# Patient Record
Sex: Female | Born: 1935 | Race: White | Hispanic: No | Smoking: Never smoker
Health system: Southern US, Community
[De-identification: ages and names within clinical notes are randomized; demographics above are authoritative.]

## PROBLEM LIST (undated history)

## (undated) DIAGNOSIS — M47816 Spondylosis without myelopathy or radiculopathy, lumbar region: Secondary | ICD-10-CM

## (undated) DIAGNOSIS — I739 Peripheral vascular disease, unspecified: Secondary | ICD-10-CM

## (undated) DIAGNOSIS — K5730 Diverticulosis of large intestine without perforation or abscess without bleeding: Secondary | ICD-10-CM

## (undated) DIAGNOSIS — F419 Anxiety disorder, unspecified: Secondary | ICD-10-CM

## (undated) DIAGNOSIS — I341 Nonrheumatic mitral (valve) prolapse: Secondary | ICD-10-CM

## (undated) DIAGNOSIS — N3090 Cystitis, unspecified without hematuria: Secondary | ICD-10-CM

## (undated) DIAGNOSIS — I639 Cerebral infarction, unspecified: Secondary | ICD-10-CM

## (undated) DIAGNOSIS — J3089 Other allergic rhinitis: Secondary | ICD-10-CM

## (undated) DIAGNOSIS — E7800 Pure hypercholesterolemia, unspecified: Secondary | ICD-10-CM

## (undated) DIAGNOSIS — S32010A Wedge compression fracture of first lumbar vertebra, initial encounter for closed fracture: Secondary | ICD-10-CM

## (undated) DIAGNOSIS — G319 Degenerative disease of nervous system, unspecified: Secondary | ICD-10-CM

## (undated) DIAGNOSIS — K226 Gastro-esophageal laceration-hemorrhage syndrome: Secondary | ICD-10-CM

## (undated) DIAGNOSIS — E049 Nontoxic goiter, unspecified: Secondary | ICD-10-CM

## (undated) DIAGNOSIS — R413 Other amnesia: Secondary | ICD-10-CM

## (undated) DIAGNOSIS — M1990 Unspecified osteoarthritis, unspecified site: Secondary | ICD-10-CM

## (undated) DIAGNOSIS — D649 Anemia, unspecified: Secondary | ICD-10-CM

## (undated) DIAGNOSIS — K642 Third degree hemorrhoids: Secondary | ICD-10-CM

## (undated) DIAGNOSIS — I10 Essential (primary) hypertension: Secondary | ICD-10-CM

## (undated) DIAGNOSIS — H4900 Third [oculomotor] nerve palsy, unspecified eye: Secondary | ICD-10-CM

## (undated) HISTORY — DX: Nontoxic goiter, unspecified: E04.9

## (undated) HISTORY — DX: Spondylosis without myelopathy or radiculopathy, lumbar region: M47.816

## (undated) HISTORY — DX: Unspecified osteoarthritis, unspecified site: M19.90

## (undated) HISTORY — DX: Cerebral infarction, unspecified: I63.9

## (undated) HISTORY — DX: Nonrheumatic mitral (valve) prolapse: I34.1

## (undated) HISTORY — DX: Third (oculomotor) nerve palsy, unspecified eye: H49.00

## (undated) HISTORY — DX: Cystitis, unspecified without hematuria: N30.90

## (undated) HISTORY — DX: Anemia, unspecified: D64.9

## (undated) HISTORY — DX: Third degree hemorrhoids: K64.2

## (undated) HISTORY — DX: Anxiety disorder, unspecified: F41.9

## (undated) HISTORY — DX: Degenerative disease of nervous system, unspecified: G31.9

## (undated) HISTORY — DX: Pure hypercholesterolemia, unspecified: E78.00

## (undated) HISTORY — DX: Other allergic rhinitis: J30.89

## (undated) HISTORY — DX: Diverticulosis of large intestine without perforation or abscess without bleeding: K57.30

## (undated) HISTORY — DX: Peripheral vascular disease, unspecified: I73.9

## (undated) HISTORY — PX: OTHER SURGICAL HISTORY: SHX169

## (undated) HISTORY — DX: Essential (primary) hypertension: I10

## (undated) HISTORY — DX: Wedge compression fracture of first lumbar vertebra, initial encounter for closed fracture: S32.010A

---

## 1958-11-27 HISTORY — PX: BREAST LUMPECTOMY: SHX2

## 1978-03-28 HISTORY — PX: APPENDECTOMY: SHX54

## 1978-03-28 HISTORY — PX: CHOLECYSTECTOMY: SHX55

## 1980-07-26 HISTORY — PX: ABDOMINAL HYSTERECTOMY: SHX81

## 1996-03-28 HISTORY — PX: COLONOSCOPY: SHX174

## 1997-12-26 HISTORY — PX: OTHER SURGICAL HISTORY: SHX169

## 1998-03-04 ENCOUNTER — Other Ambulatory Visit: Admission: RE | Admit: 1998-03-04 | Discharge: 1998-03-04 | Payer: Self-pay | Admitting: Obstetrics & Gynecology

## 1999-03-09 ENCOUNTER — Other Ambulatory Visit: Admission: RE | Admit: 1999-03-09 | Discharge: 1999-03-09 | Payer: Self-pay | Admitting: Obstetrics & Gynecology

## 2000-02-06 ENCOUNTER — Emergency Department (HOSPITAL_COMMUNITY): Admission: EM | Admit: 2000-02-06 | Discharge: 2000-02-06 | Payer: Self-pay | Admitting: Emergency Medicine

## 2000-02-16 ENCOUNTER — Emergency Department (HOSPITAL_COMMUNITY): Admission: EM | Admit: 2000-02-16 | Discharge: 2000-02-16 | Payer: Self-pay

## 2001-04-06 ENCOUNTER — Encounter: Payer: Self-pay | Admitting: Pulmonary Disease

## 2001-04-06 ENCOUNTER — Ambulatory Visit (HOSPITAL_COMMUNITY): Admission: RE | Admit: 2001-04-06 | Discharge: 2001-04-06 | Payer: Self-pay | Admitting: Pulmonary Disease

## 2002-10-30 ENCOUNTER — Encounter: Payer: Self-pay | Admitting: Gastroenterology

## 2002-10-30 ENCOUNTER — Ambulatory Visit (HOSPITAL_COMMUNITY): Admission: RE | Admit: 2002-10-30 | Discharge: 2002-10-30 | Payer: Self-pay | Admitting: Gastroenterology

## 2004-02-02 ENCOUNTER — Ambulatory Visit: Payer: Self-pay | Admitting: Pulmonary Disease

## 2004-08-16 ENCOUNTER — Ambulatory Visit: Payer: Self-pay | Admitting: Pulmonary Disease

## 2004-09-29 ENCOUNTER — Ambulatory Visit: Payer: Self-pay | Admitting: Pulmonary Disease

## 2004-10-22 ENCOUNTER — Ambulatory Visit: Payer: Self-pay | Admitting: Pulmonary Disease

## 2004-10-26 ENCOUNTER — Ambulatory Visit: Payer: Self-pay | Admitting: Gastroenterology

## 2004-11-02 ENCOUNTER — Ambulatory Visit (HOSPITAL_COMMUNITY): Admission: RE | Admit: 2004-11-02 | Discharge: 2004-11-02 | Payer: Self-pay | Admitting: Gastroenterology

## 2004-11-04 ENCOUNTER — Ambulatory Visit: Payer: Self-pay | Admitting: Pulmonary Disease

## 2004-11-16 ENCOUNTER — Ambulatory Visit: Payer: Self-pay | Admitting: Gastroenterology

## 2004-12-21 ENCOUNTER — Other Ambulatory Visit: Admission: RE | Admit: 2004-12-21 | Discharge: 2004-12-21 | Payer: Self-pay | Admitting: Obstetrics & Gynecology

## 2005-01-07 ENCOUNTER — Ambulatory Visit: Payer: Self-pay | Admitting: Pulmonary Disease

## 2005-01-10 ENCOUNTER — Ambulatory Visit: Payer: Self-pay | Admitting: Pulmonary Disease

## 2005-01-13 ENCOUNTER — Ambulatory Visit: Payer: Self-pay | Admitting: Pulmonary Disease

## 2005-01-17 ENCOUNTER — Ambulatory Visit: Payer: Self-pay | Admitting: Pulmonary Disease

## 2005-04-22 ENCOUNTER — Ambulatory Visit: Payer: Self-pay | Admitting: Pulmonary Disease

## 2005-10-04 ENCOUNTER — Ambulatory Visit: Payer: Self-pay | Admitting: Pulmonary Disease

## 2005-10-17 ENCOUNTER — Ambulatory Visit: Payer: Self-pay | Admitting: Pulmonary Disease

## 2006-01-10 ENCOUNTER — Ambulatory Visit: Payer: Self-pay | Admitting: Pulmonary Disease

## 2006-04-18 ENCOUNTER — Ambulatory Visit: Payer: Self-pay | Admitting: Pulmonary Disease

## 2006-08-23 ENCOUNTER — Ambulatory Visit: Payer: Self-pay | Admitting: Pulmonary Disease

## 2006-09-03 ENCOUNTER — Emergency Department (HOSPITAL_COMMUNITY): Admission: EM | Admit: 2006-09-03 | Discharge: 2006-09-03 | Payer: Self-pay | Admitting: Emergency Medicine

## 2006-10-06 ENCOUNTER — Ambulatory Visit: Payer: Self-pay | Admitting: Pulmonary Disease

## 2006-10-06 LAB — CONVERTED CEMR LAB
AST: 26 units/L (ref 0–37)
BUN: 14 mg/dL (ref 6–23)
Bacteria, UA: NEGATIVE
Basophils Absolute: 0 10*3/uL (ref 0.0–0.1)
Basophils Relative: 0.1 % (ref 0.0–1.0)
Bilirubin, Direct: 0.1 mg/dL (ref 0.0–0.3)
CO2: 30 meq/L (ref 19–32)
Cholesterol: 193 mg/dL (ref 0–200)
Eosinophils Absolute: 0.1 10*3/uL (ref 0.0–0.6)
Eosinophils Relative: 2.7 % (ref 0.0–5.0)
GFR calc Af Amer: 89 mL/min
HDL: 58.1 mg/dL (ref >39.0)
Hemoglobin: 13.5 g/dL (ref 12.0–15.0)
Lymphocytes Relative: 18.6 % (ref 12.0–46.0)
MCHC: 35.1 g/dL (ref 30.0–36.0)
Monocytes Absolute: 0.5 10*3/uL (ref 0.2–0.7)
Nitrite: NEGATIVE
Platelets: 234 10*3/uL (ref 150–400)
Potassium: 4.3 meq/L (ref 3.5–5.1)
RBC / HPF: NONE SEEN
RDW: 12.6 % (ref 11.5–14.6)
TSH: 1.46 microintl units/mL (ref 0.35–5.50)
Total CHOL/HDL Ratio: 3.3
Total Protein, Urine: NEGATIVE mg/dL
Total Protein: 6.7 g/dL (ref 6.0–8.3)
Urobilinogen, UA: 0.2 (ref 0.0–1.0)
VLDL: 19 mg/dL (ref 0–40)

## 2006-10-12 ENCOUNTER — Ambulatory Visit: Payer: Self-pay

## 2006-10-18 ENCOUNTER — Ambulatory Visit: Payer: Self-pay | Admitting: Pulmonary Disease

## 2006-10-18 LAB — CONVERTED CEMR LAB
Fecal Occult Blood: NEGATIVE
OCCULT 3: NEGATIVE
OCCULT 4: NEGATIVE
OCCULT 5: NEGATIVE

## 2006-12-05 ENCOUNTER — Encounter: Admission: RE | Admit: 2006-12-05 | Discharge: 2006-12-05 | Payer: Self-pay | Admitting: Orthopedic Surgery

## 2007-01-08 ENCOUNTER — Ambulatory Visit: Payer: Self-pay | Admitting: Pulmonary Disease

## 2007-01-15 ENCOUNTER — Ambulatory Visit: Payer: Self-pay | Admitting: Pulmonary Disease

## 2007-02-27 ENCOUNTER — Ambulatory Visit: Payer: Self-pay | Admitting: Pulmonary Disease

## 2007-02-27 ENCOUNTER — Encounter: Payer: Self-pay | Admitting: Pulmonary Disease

## 2007-02-27 DIAGNOSIS — J3089 Other allergic rhinitis: Secondary | ICD-10-CM

## 2007-02-27 DIAGNOSIS — K642 Third degree hemorrhoids: Secondary | ICD-10-CM

## 2007-02-27 DIAGNOSIS — I872 Venous insufficiency (chronic) (peripheral): Secondary | ICD-10-CM | POA: Insufficient documentation

## 2007-02-27 DIAGNOSIS — I059 Rheumatic mitral valve disease, unspecified: Secondary | ICD-10-CM | POA: Insufficient documentation

## 2007-02-27 DIAGNOSIS — I739 Peripheral vascular disease, unspecified: Secondary | ICD-10-CM

## 2007-02-27 DIAGNOSIS — M1990 Unspecified osteoarthritis, unspecified site: Secondary | ICD-10-CM | POA: Insufficient documentation

## 2007-02-27 DIAGNOSIS — E049 Nontoxic goiter, unspecified: Secondary | ICD-10-CM | POA: Insufficient documentation

## 2007-02-27 DIAGNOSIS — G319 Degenerative disease of nervous system, unspecified: Secondary | ICD-10-CM | POA: Insufficient documentation

## 2007-02-27 DIAGNOSIS — M810 Age-related osteoporosis without current pathological fracture: Secondary | ICD-10-CM | POA: Insufficient documentation

## 2007-02-27 HISTORY — DX: Third degree hemorrhoids: K64.2

## 2007-04-06 ENCOUNTER — Telehealth (INDEPENDENT_AMBULATORY_CARE_PROVIDER_SITE_OTHER): Payer: Self-pay | Admitting: *Deleted

## 2007-04-30 ENCOUNTER — Inpatient Hospital Stay (HOSPITAL_COMMUNITY): Admission: RE | Admit: 2007-04-30 | Discharge: 2007-05-03 | Payer: Self-pay | Admitting: Orthopedic Surgery

## 2007-05-17 ENCOUNTER — Ambulatory Visit: Payer: Self-pay | Admitting: Vascular Surgery

## 2007-05-18 ENCOUNTER — Encounter (INDEPENDENT_AMBULATORY_CARE_PROVIDER_SITE_OTHER): Payer: Self-pay | Admitting: Orthopedic Surgery

## 2007-05-18 ENCOUNTER — Ambulatory Visit (HOSPITAL_COMMUNITY): Admission: RE | Admit: 2007-05-18 | Discharge: 2007-05-18 | Payer: Self-pay | Admitting: Orthopedic Surgery

## 2007-07-25 ENCOUNTER — Encounter: Payer: Self-pay | Admitting: Pulmonary Disease

## 2007-09-24 ENCOUNTER — Ambulatory Visit: Payer: Self-pay | Admitting: Pulmonary Disease

## 2007-09-24 LAB — CONVERTED CEMR LAB: Vit D, 1,25-Dihydroxy: 53 (ref 30–89)

## 2007-09-29 LAB — CONVERTED CEMR LAB
ALT: 23 units/L (ref 0–35)
AST: 25 units/L (ref 0–37)
CRP, High Sensitivity: <1 — ABNORMAL LOW (ref 0.00–5.00)
Calcium: 9.9 mg/dL (ref 8.4–10.5)
Cholesterol: 177 mg/dL (ref 0–200)
Eosinophils Absolute: 0.2 10*3/uL (ref 0.0–0.7)
Eosinophils Relative: 4.6 % (ref 0.0–5.0)
GFR calc Af Amer: 89 mL/min
Glucose, Bld: 95 mg/dL (ref 70–99)
HCT: 37.7 % (ref 36.0–46.0)
Lymphocytes Relative: 20.5 % (ref 12.0–46.0)
Neutrophils Relative %: 64.6 % (ref 43.0–77.0)
Platelets: 228 10*3/uL (ref 150–400)
RBC: 4.02 M/uL (ref 3.87–5.11)
Total Bilirubin: 1.1 mg/dL (ref 0.3–1.2)
Total Protein: 6.8 g/dL (ref 6.0–8.3)
VLDL: 19 mg/dL (ref 0–40)

## 2007-11-12 ENCOUNTER — Telehealth: Payer: Self-pay | Admitting: Pulmonary Disease

## 2007-11-19 ENCOUNTER — Telehealth (INDEPENDENT_AMBULATORY_CARE_PROVIDER_SITE_OTHER): Payer: Self-pay | Admitting: *Deleted

## 2007-11-19 ENCOUNTER — Ambulatory Visit: Payer: Self-pay | Admitting: Pulmonary Disease

## 2007-11-20 ENCOUNTER — Encounter: Payer: Self-pay | Admitting: Pulmonary Disease

## 2007-11-21 ENCOUNTER — Telehealth: Payer: Self-pay | Admitting: Pulmonary Disease

## 2007-11-21 LAB — CONVERTED CEMR LAB
Bilirubin Urine: NEGATIVE
Hemoglobin, Urine: NEGATIVE
Total Protein, Urine: NEGATIVE mg/dL
pH: 7 (ref 5.0–8.0)

## 2007-11-22 ENCOUNTER — Encounter: Payer: Self-pay | Admitting: Pulmonary Disease

## 2007-12-11 ENCOUNTER — Telehealth: Payer: Self-pay | Admitting: Pulmonary Disease

## 2008-01-02 ENCOUNTER — Ambulatory Visit: Payer: Self-pay | Admitting: Pulmonary Disease

## 2008-03-24 ENCOUNTER — Ambulatory Visit: Payer: Self-pay | Admitting: Pulmonary Disease

## 2008-03-25 DIAGNOSIS — N3090 Cystitis, unspecified without hematuria: Secondary | ICD-10-CM | POA: Insufficient documentation

## 2008-04-22 ENCOUNTER — Ambulatory Visit: Payer: Self-pay | Admitting: Adult Health

## 2008-05-02 ENCOUNTER — Encounter (INDEPENDENT_AMBULATORY_CARE_PROVIDER_SITE_OTHER): Payer: Self-pay | Admitting: *Deleted

## 2008-08-26 ENCOUNTER — Ambulatory Visit: Payer: Self-pay | Admitting: Gastroenterology

## 2008-08-26 ENCOUNTER — Telehealth: Payer: Self-pay | Admitting: Gastroenterology

## 2008-08-29 LAB — CONVERTED CEMR LAB
ALT: 16 units/L (ref 0–35)
Albumin: 3.4 g/dL — ABNORMAL LOW (ref 3.5–5.2)
BUN: 16 mg/dL (ref 6–23)
Basophils Relative: 0.9 % (ref 0.0–3.0)
CO2: 32 meq/L (ref 19–32)
Calcium: 9.9 mg/dL (ref 8.4–10.5)
Chloride: 101 meq/L (ref 96–112)
Eosinophils Relative: 6.9 % — ABNORMAL HIGH (ref 0.0–5.0)
GFR calc non Af Amer: 85.06 mL/min (ref >60)
Glucose, Bld: 84 mg/dL (ref 70–99)
MCV: 97.2 fL (ref 78.0–100.0)
Monocytes Absolute: 0.7 10*3/uL (ref 0.1–1.0)
Monocytes Relative: 11.3 % (ref 3.0–12.0)
Neutro Abs: 3.8 10*3/uL (ref 1.4–7.7)
Platelets: 207 10*3/uL (ref 150.0–400.0)
RBC: 3.72 M/uL — ABNORMAL LOW (ref 3.87–5.11)
Total Bilirubin: 0.7 mg/dL (ref 0.3–1.2)
Total Protein: 6.6 g/dL (ref 6.0–8.3)

## 2008-09-03 ENCOUNTER — Encounter: Payer: Self-pay | Admitting: Pulmonary Disease

## 2008-09-23 ENCOUNTER — Ambulatory Visit: Payer: Self-pay | Admitting: Pulmonary Disease

## 2008-09-27 LAB — CONVERTED CEMR LAB
Bilirubin Urine: NEGATIVE
Cholesterol: 195 mg/dL
HDL: 63 mg/dL
Hemoglobin, Urine: NEGATIVE
Ketones, ur: NEGATIVE mg/dL
LDL Cholesterol: 122 mg/dL — ABNORMAL HIGH
Nitrite: NEGATIVE
Sed Rate: 17 mm/h
Specific Gravity, Urine: 1.015
TSH: 0.98 u[IU]/mL
Total CHOL/HDL Ratio: 3
Total Protein, Urine: NEGATIVE mg/dL
Triglycerides: 49 mg/dL
Urine Glucose: NEGATIVE mg/dL
Urobilinogen, UA: 0.2
VLDL: 9.8 mg/dL
pH: 6.5

## 2008-11-13 ENCOUNTER — Telehealth: Payer: Self-pay | Admitting: Adult Health

## 2008-11-14 ENCOUNTER — Ambulatory Visit: Payer: Self-pay | Admitting: Adult Health

## 2008-11-24 ENCOUNTER — Ambulatory Visit: Payer: Self-pay | Admitting: Pulmonary Disease

## 2008-11-24 ENCOUNTER — Telehealth: Payer: Self-pay | Admitting: Pulmonary Disease

## 2008-11-28 ENCOUNTER — Telehealth: Payer: Self-pay | Admitting: Pulmonary Disease

## 2008-11-28 ENCOUNTER — Ambulatory Visit: Payer: Self-pay | Admitting: Internal Medicine

## 2008-11-28 ENCOUNTER — Encounter: Payer: Self-pay | Admitting: Adult Health

## 2008-11-28 DIAGNOSIS — H538 Other visual disturbances: Secondary | ICD-10-CM | POA: Insufficient documentation

## 2008-12-02 ENCOUNTER — Ambulatory Visit: Payer: Self-pay | Admitting: Pulmonary Disease

## 2008-12-02 ENCOUNTER — Inpatient Hospital Stay (HOSPITAL_COMMUNITY): Admission: EM | Admit: 2008-12-02 | Discharge: 2008-12-05 | Payer: Self-pay | Admitting: Emergency Medicine

## 2008-12-02 ENCOUNTER — Encounter: Payer: Self-pay | Admitting: *Deleted

## 2008-12-02 ENCOUNTER — Emergency Department (HOSPITAL_COMMUNITY): Admission: EM | Admit: 2008-12-02 | Discharge: 2008-12-02 | Payer: Self-pay | Admitting: Emergency Medicine

## 2008-12-02 ENCOUNTER — Ambulatory Visit: Payer: Self-pay | Admitting: Internal Medicine

## 2008-12-02 DIAGNOSIS — I6350 Cerebral infarction due to unspecified occlusion or stenosis of unspecified cerebral artery: Secondary | ICD-10-CM | POA: Insufficient documentation

## 2008-12-03 ENCOUNTER — Encounter: Payer: Self-pay | Admitting: Pulmonary Disease

## 2008-12-05 ENCOUNTER — Encounter: Payer: Self-pay | Admitting: Pulmonary Disease

## 2008-12-11 ENCOUNTER — Telehealth: Payer: Self-pay | Admitting: Pulmonary Disease

## 2008-12-30 ENCOUNTER — Ambulatory Visit: Payer: Self-pay | Admitting: Pulmonary Disease

## 2008-12-30 DIAGNOSIS — I10 Essential (primary) hypertension: Secondary | ICD-10-CM

## 2008-12-30 DIAGNOSIS — H4900 Third [oculomotor] nerve palsy, unspecified eye: Secondary | ICD-10-CM | POA: Insufficient documentation

## 2009-02-02 ENCOUNTER — Encounter: Payer: Self-pay | Admitting: Pulmonary Disease

## 2009-02-17 ENCOUNTER — Telehealth: Payer: Self-pay | Admitting: Pulmonary Disease

## 2009-03-15 IMAGING — CR DG HIP (WITH OR WITHOUT PELVIS) 2-3V*L*
3 series · 3 of 3 positions shown · non-contrast
Comparison: Pelvic CT 11/02/04.

CLINICAL DATA: Left hip osteoarthritis.  Pre total hip replacement.  
 LEFT HIP ? 3 VIEW:

[t pelvis a.p.]
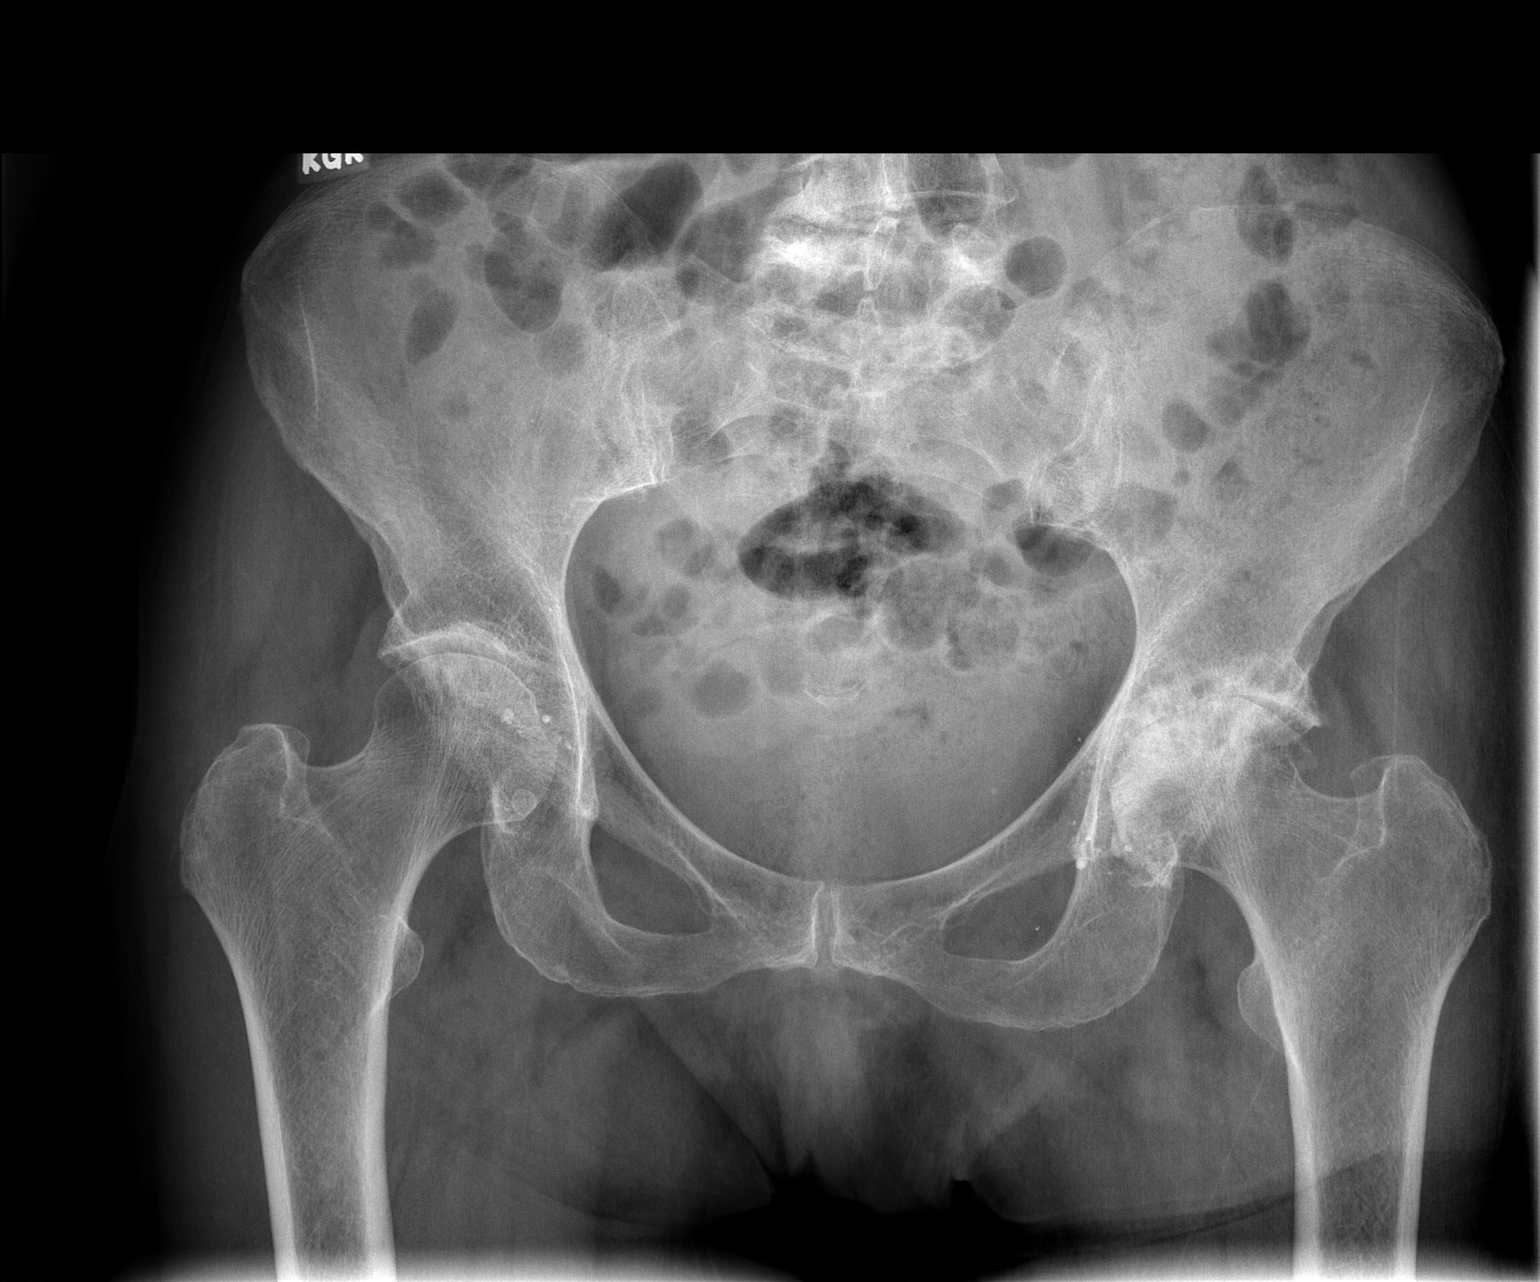

[t hip ap left]
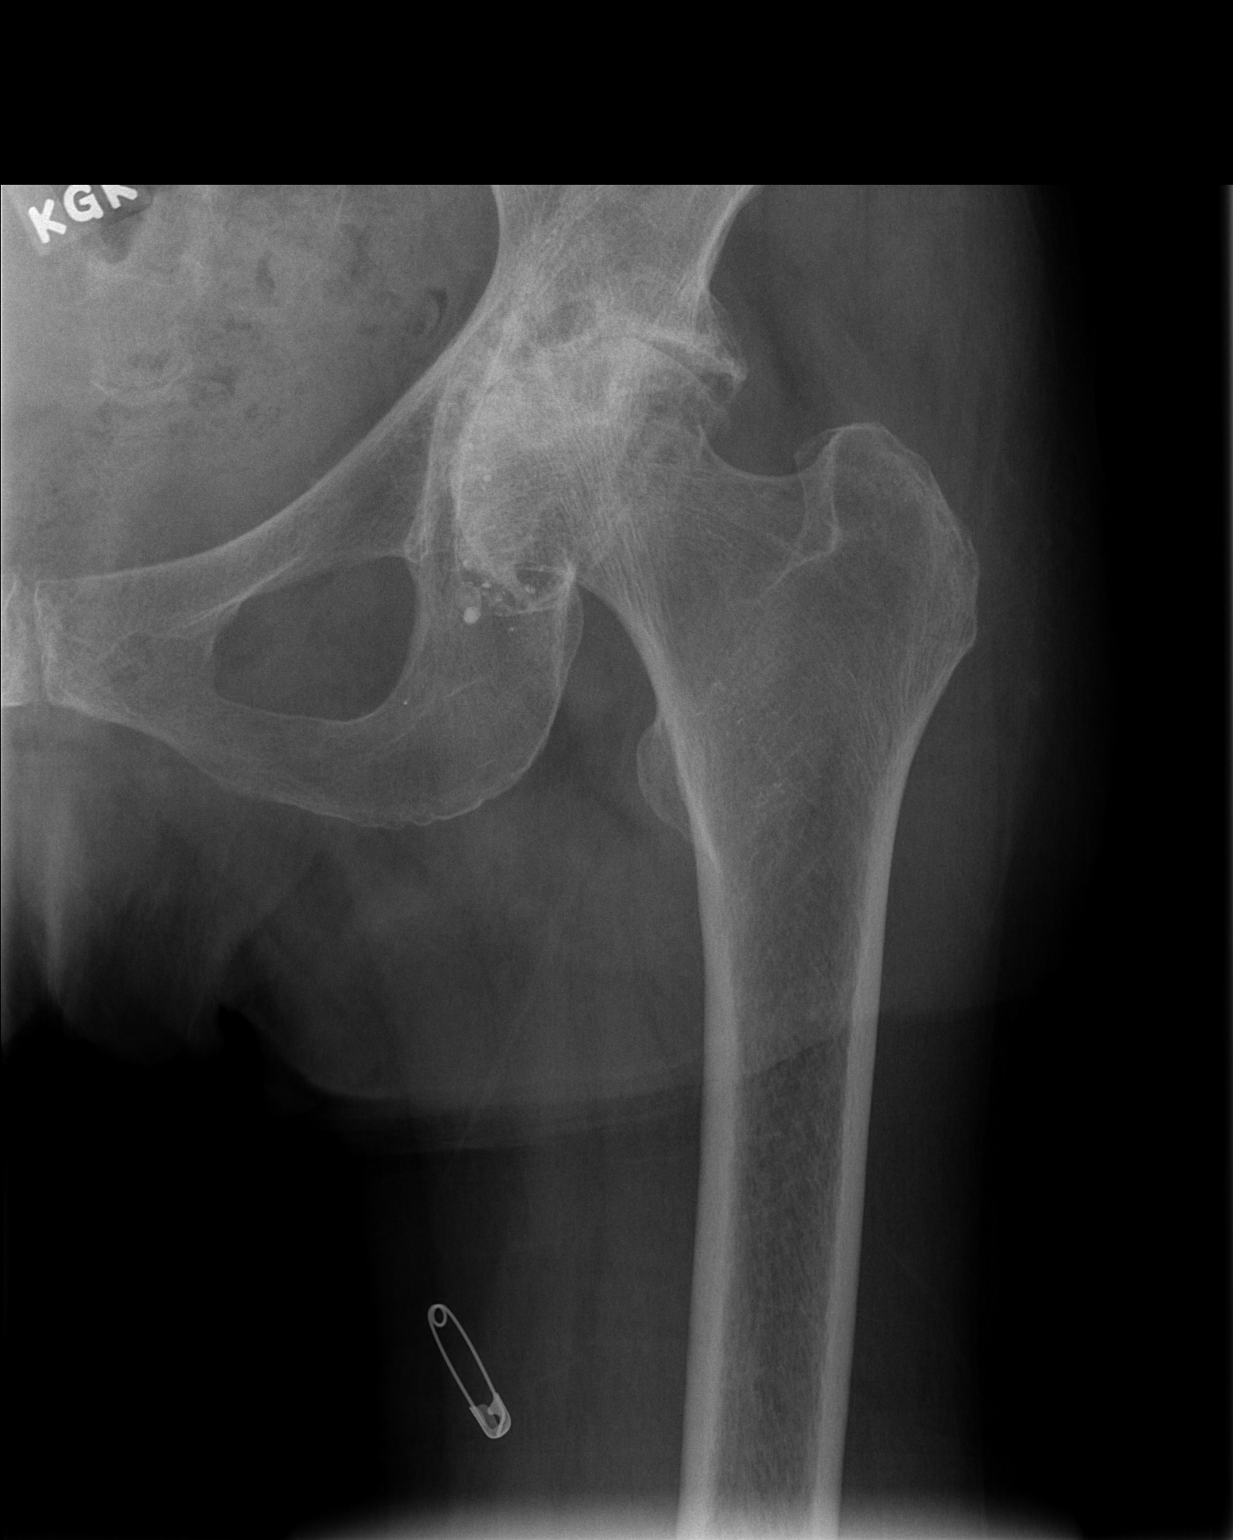

[t hip frog leg left]
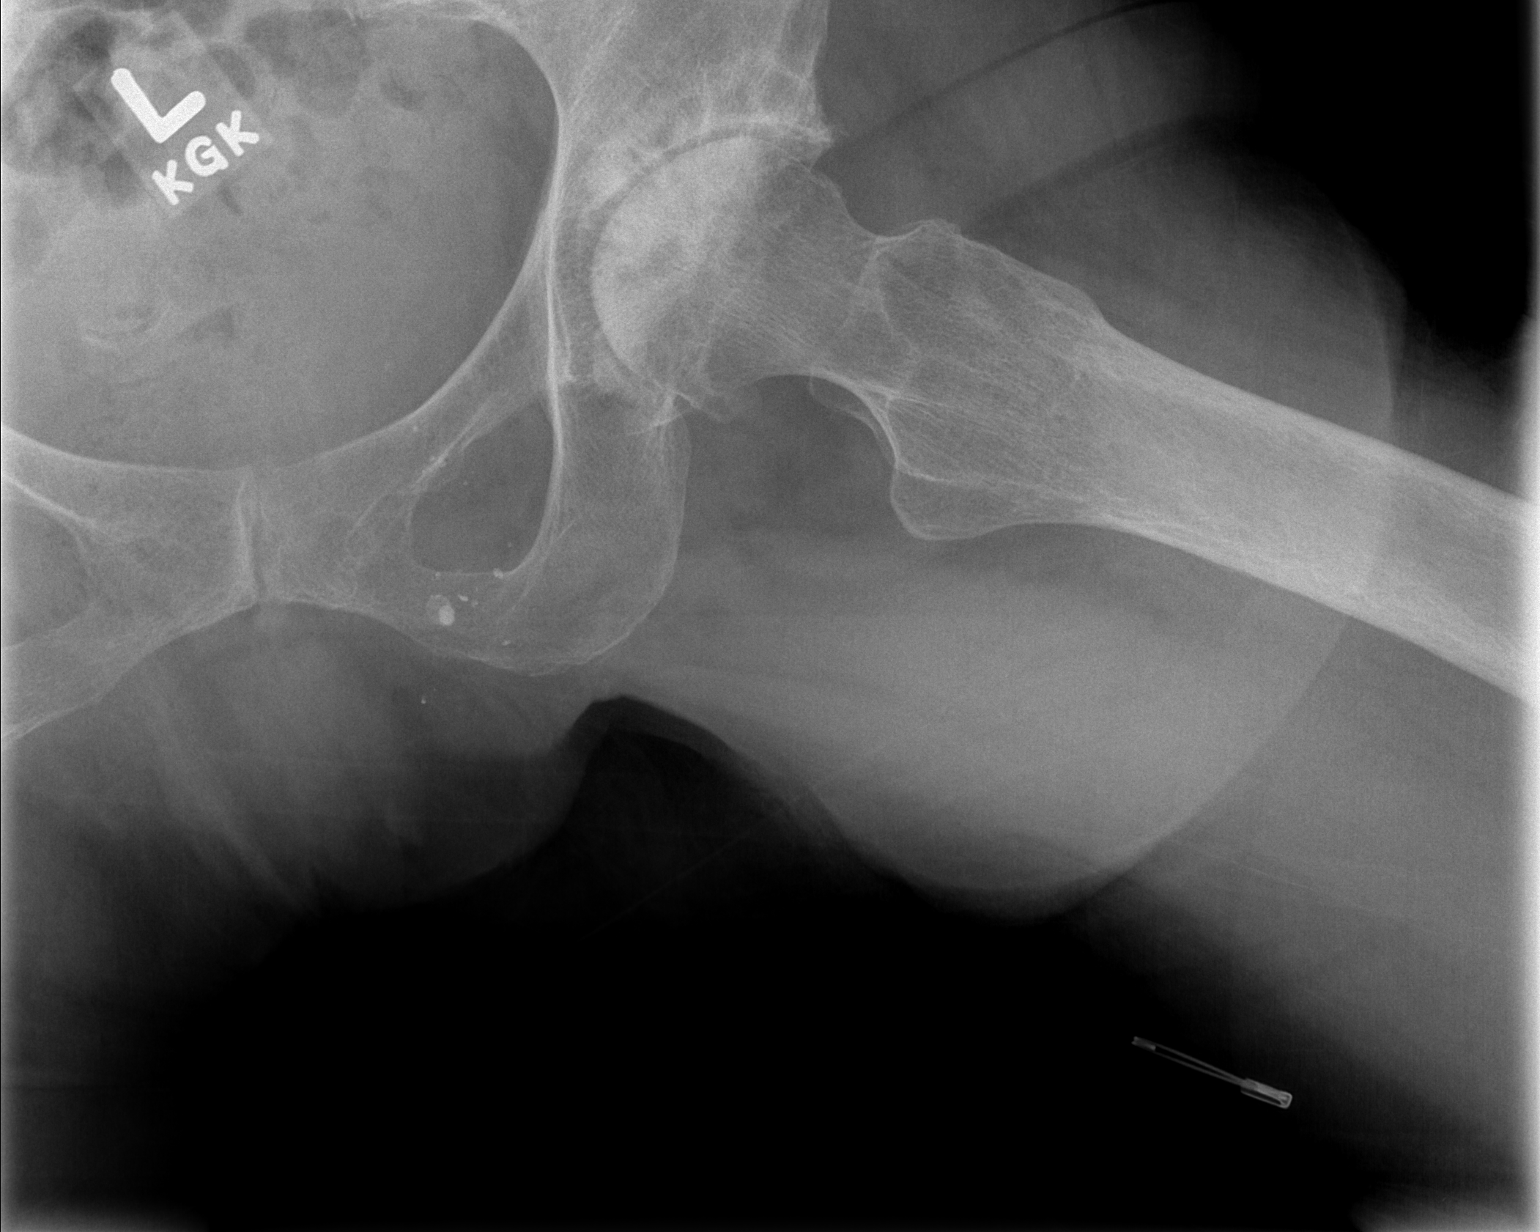

[3 of 3 positions shown; findings below may reference images not displayed]

FINDINGS: There is progressive advanced left hip osteoarthritis with advanced joint space loss, subchondral sclerosis and cystic change.  There is some flattening of the left femoral head.  Milder degenerative changes of the right hip appear stable.  There is no evidence of acute fracture.  Mineralization is adequate.  There is a convex left scoliosis of the lower lumbar spine.  Radiodensities overlying both hip joints correspond with calcifications in the posterior subcutaneous fat on the prior CT scan.
IMPRESSION: Progressive advanced left hip osteoarthritis with flattening of the left femoral head.  No evidence of acute fracture.

## 2009-03-30 ENCOUNTER — Ambulatory Visit: Payer: Self-pay | Admitting: Pulmonary Disease

## 2009-03-30 DIAGNOSIS — F411 Generalized anxiety disorder: Secondary | ICD-10-CM | POA: Insufficient documentation

## 2009-03-30 LAB — CONVERTED CEMR LAB
ALT: 20 U/L
AST: 23 U/L
Albumin: 4 g/dL
Alkaline Phosphatase: 84 U/L
BUN: 13 mg/dL
Basophils Absolute: 0 K/uL
Basophils Relative: 0.7 %
Bilirubin, Direct: 0.1 mg/dL
CO2: 32 meq/L
Calcium: 9.6 mg/dL
Chloride: 100 meq/L
Cholesterol: 199 mg/dL
Creatinine, Ser: 0.7 mg/dL
Eosinophils Absolute: 0.1 K/uL
Eosinophils Relative: 2.7 %
GFR calc non Af Amer: 84.94 mL/min
Glucose, Bld: 97 mg/dL
HCT: 40.5 %
HDL: 65.6 mg/dL
Hemoglobin: 13.8 g/dL
LDL Cholesterol: 112 mg/dL — ABNORMAL HIGH
Lymphocytes Relative: 17.2 %
Lymphs Abs: 0.8 K/uL
MCHC: 34 g/dL
MCV: 99.4 fL
Monocytes Absolute: 0.5 K/uL
Monocytes Relative: 10.8 %
Neutro Abs: 3.3 K/uL
Neutrophils Relative %: 68.6 %
Platelets: 231 K/uL
Potassium: 3.9 meq/L
RBC: 4.07 M/uL
RDW: 11.9 %
Sodium: 138 meq/L
TSH: 1.22 u[IU]/mL
Total Bilirubin: 0.8 mg/dL
Total CHOL/HDL Ratio: 3
Total Protein: 7.2 g/dL
Triglycerides: 109 mg/dL
VLDL: 21.8 mg/dL
WBC: 4.7 10*3/microliter

## 2009-08-28 ENCOUNTER — Telehealth: Payer: Self-pay | Admitting: Pulmonary Disease

## 2009-10-05 ENCOUNTER — Ambulatory Visit: Payer: Self-pay | Admitting: Pulmonary Disease

## 2009-10-06 DIAGNOSIS — E785 Hyperlipidemia, unspecified: Secondary | ICD-10-CM | POA: Insufficient documentation

## 2009-10-06 LAB — CONVERTED CEMR LAB
BUN: 17 mg/dL (ref 6–23)
Basophils Absolute: 0 10*3/uL (ref 0.0–0.1)
Chloride: 105 meq/L (ref 96–112)
Direct LDL: 132.7 mg/dL
HCT: 40.3 % (ref 36.0–46.0)
HDL: 68.9 mg/dL (ref >39.00)
Hemoglobin: 14.2 g/dL (ref 12.0–15.0)
Lymphs Abs: 0.9 10*3/uL (ref 0.7–4.0)
MCHC: 35.3 g/dL (ref 30.0–36.0)
Monocytes Absolute: 0.5 10*3/uL (ref 0.1–1.0)
Neutrophils Relative %: 68.7 % (ref 43.0–77.0)
RBC: 4.21 M/uL (ref 3.87–5.11)
TSH: 1.4 microintl units/mL (ref 0.35–5.50)
Total Bilirubin: 0.6 mg/dL (ref 0.3–1.2)
Total CHOL/HDL Ratio: 3
WBC: 6 10*3/uL (ref 4.5–10.5)

## 2009-11-11 ENCOUNTER — Encounter: Payer: Self-pay | Admitting: Pulmonary Disease

## 2009-11-11 ENCOUNTER — Encounter: Admission: RE | Admit: 2009-11-11 | Discharge: 2009-11-11 | Payer: Self-pay | Admitting: Neurology

## 2009-11-14 ENCOUNTER — Encounter: Admission: RE | Admit: 2009-11-14 | Discharge: 2009-11-14 | Payer: Self-pay | Admitting: Neurology

## 2009-12-14 ENCOUNTER — Encounter: Payer: Self-pay | Admitting: Pulmonary Disease

## 2009-12-23 ENCOUNTER — Ambulatory Visit: Payer: Self-pay | Admitting: Pulmonary Disease

## 2010-02-02 ENCOUNTER — Encounter: Admission: RE | Admit: 2010-02-02 | Discharge: 2010-02-02 | Payer: Self-pay | Admitting: Neurology

## 2010-04-26 ENCOUNTER — Ambulatory Visit
Admission: RE | Admit: 2010-04-26 | Discharge: 2010-04-26 | Payer: Self-pay | Source: Home / Self Care | Attending: Pulmonary Disease | Admitting: Pulmonary Disease

## 2010-04-26 ENCOUNTER — Other Ambulatory Visit: Payer: Self-pay | Admitting: Pulmonary Disease

## 2010-04-26 LAB — LIPID PANEL
Cholesterol: 208 mg/dL — ABNORMAL HIGH (ref 0–200)
HDL: 65.8 mg/dL (ref >39.00)
Triglycerides: 106 mg/dL (ref 0.0–149.0)
VLDL: 21.2 mg/dL (ref 0.0–40.0)

## 2010-04-29 ENCOUNTER — Telehealth (INDEPENDENT_AMBULATORY_CARE_PROVIDER_SITE_OTHER): Payer: Self-pay | Admitting: *Deleted

## 2010-04-29 NOTE — Assessment & Plan Note (Signed)
Summary: flu shot///JJ  Nurse Visit   Allergies: 1)  ! Septra 2)  ! * Shellfish 3)  ! Augmentin  Orders Added: 1)  Flu Vaccine 81yrs + MEDICARE PATIENTS [Q2039] 2)  Administration Flu vaccine - MCR [G0008] Flu Vaccine Consent Questions     Do you have a history of severe allergic reactions to this vaccine? no    Any prior history of allergic reactions to egg and/or gelatin? no    Do you have a sensitivity to the preservative Thimersol? no    Do you have a past history of Guillan-Barre Syndrome? no    Do you currently have an acute febrile illness? no    Have you ever had a severe reaction to latex? no    Vaccine information given and explained to patient? yes    Are you currently pregnant? no    Lot Number:[id number]   Exp Date:09/25/2010   Site Given  Left Deltoid IMmedflu   Tammy Scott  December 23, 2009 10:39 AM

## 2010-04-29 NOTE — Progress Notes (Signed)
Summary: Plavix  Phone Note Call from Patient Call back at Home Phone [phone]   Caller: Patient Call For: [name] Reason for Call: Talk to Nurse Summary of Call: Pt on lavix - Having a lot of side effects - Tired after taking w/confusion - seems the longer she takes it, the more profound it gets.  She had a lot of stress when she atarted taking it but says not anymore.  Please advise Initial call taken by: Eugene Gavia,  August 28, 2009 4:11 PM  Follow-up for Phone Call        Pt states since  she has been on plavix she has been havign side effects. She states side effects have been getting worse lately. She c/o after taking palvix int he am she has no energy, she states she has had increaed confusion with simple things like driving directions to places she ahs been many times in the past, she also states the whites of her eyes are a little yellow. Sh estaets she read the side effect paper that comes withthe Plavix and it lists all of these symtoms. Pt asking if she can stop plavix. Please advise. [name] CMA  August 28, 2009 4:34 PM   Additional Follow-up for Phone Call Additional follow up Details #1::        per SN---ok for her to just stop the plavix due to the side effects.  thanks Randell Loop CMA  August 28, 2009 4:51 PM   pt advised. [name] CMA  August 28, 2009 4:57 PM

## 2010-04-29 NOTE — Assessment & Plan Note (Signed)
Summary: 6 months/apc   Primary Care Provider:  Alroy Dust, MD  CC:  6 month ROV & review of mult medical problems....  History of Present Illness: 77 y/o WF here for a follow up visit... she has multiple medical problems as noted below...  Followed for general medical purposes w/ hx of HBP, ?MVP, ASPVD, Goiter, Divertics, DJD w/ left THR, Osteoporosis, Stoke & 3rd N paresis, Memory loss, & Anxiety...   ~  [facility] 9/6-[facility]10 w/ blurry vision & exam showing mild field cut & left eye strabismus... eval by Neurology/ stroke team showed tiny right occipital infarct, diffuse intra /extracranial atherosclerotic dis (but no focal stenoses or interventionable lesions) & a part left 3rd nerve palsy as well... PLAVIX was added to her ASA therapy... SEE DC SUMMARY.   ~  December 30, 2008:  she has been stable since disch but quite distressed by the left 3rd N palsy & strabismus... she is very anxious about her situation... I have tried to reassure her & have recommended f/u w/ Neurology- DrSethi, and w/ Ophthalmology- DrTMartin [phone]... we will try to set these up.   ~  March 30, 2009:  her strabismus has resolved as predicted by DrTMartin, Ophthalmology at [facility] & she is pleased... still rather stressed w/ mult somatic complaints... notes mild edema on the Norvasc10 but doesn't want diuretic added to to excess urination already... she still goes to the Gym regularly... she is on PLAVIX per DrSethi but stopped the ASA per his suggestion...   ~  October 05, 2009:  she c/o some edema in LEs late in the day "it's due to my vein stripping yrs ago" & we reviewed no salt, elevation & support hose...  BP checks have all been good, denies CP/ palpit/ SOB, etc... 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 on ASA + Plavix at disch...  ~  11/10:  f/u DrSethi stopped ASA, continued Plavix for her  cerebrovasc dis...  ~  11/10:  saw [facility]in at [facility]- he predicted good prognosis for spont recovery of left eye movement.  ~  1/11:  her left 3rd nerve palsy has resolved...  ALLERGIC RHINITIS (ICD-477.8) - uses OTC antihistamines infrequently...  HYPERTENSION, MILD (ICD-401.1) - she has mild HBP and started on NORVASC 10mg /d... BP= 110/60 today and 130's/ 70's at home... denies HA, fatigue, CP, palipit, dizziness, syncope, dyspnea; min edema noted...  ? of MITRAL VALVE PROLAPSE (ICD-424.0) - Clinical Dx - baseline EKG w/ NSR, WNL;  Neg stress thallium 4/93... she denies CP, palpit, SOB, etc... she exercises regularly walking 3-4 miles and going to the gym.  ~  2DEcho 9/10 showed trivial prolapse of the post leaflet, norm LVF w/ EF= 65-70%, no regional wall motion abnormalities...  PERIPHERAL VASCULAR DISEASE (ICD-443.9) - now on PLAVIX 75mg /d... CTAbd 8/06 showed thickening of Ao wall & prob plaque;  CDopplers 7/08 showed 0-39% bilat ICA stenoses & plaque in Rt subclavian art (plus Rt Thyroid Mass).  ~  MRI/ MRA Brain & Neck 9/10 showed intra & extracranial atherosclerotic changes w/o interventionable lesions & no hemodynamically signif stenoses in the neck...  VENOUS INSUFFICIENCY (ICD-459.81) - she follows a low sodium diet... no edema at this time...  HYPERCHOLESTEROLEMIA, BORDERLINE (ICD-272.4) - on diet alone, she does not want meds.  ~  FLP 1/11 showed TChol 199, TG 109, HDL 66, LDL 112  ~  FLP 7/11 showed TChol 212, TG 66, HDL 69, LDL 133  GOITER, UNSPECIFIED (ICD-240.9) - Old CXR's show deviation of trachea to left by thyroid & CDoppler showed right thyroid mass... she has no local symptoms, swallowing difficulty & is clinically euthyroid...   ~  labs 6/09 showed TSH= 1.04  ~  labs 6/10 showed TSH= 0.98  ~  labs 7/11 showed TSH= 1.40  DIVERTICULOSIS OF COLON (ICD-562.10) & HEMORRHOIDS (ICD-455.6) - Hx of very tortuous & redundant colon w/ severe sigmoid diverticulosis;  last  colonoscopy w/ pediatric scope 10/98 was difficult & subseq barium enema showed severe divertics otherwise neg;  Rx fiber, anusol, etc...  CTAbd 1/03 w/ divertics otherwise neg;  she is reminded to take the [facility] regularly.  UNSPECIFIED CYSTITIS (ICD-595.9) - Urology eval 8/09 by DrDalstadt...  OSTEOARTHRITIS (ICD-715.90) - DJD in both hips L>Rt w/ LTHR done 2/09 by DrAlusio...   OSTEOPOROSIS (ICD-733.00) - BMD by DrNeal 7/02 showed TScores -2.3 to -3.2;  regular f/u BMD from DrNeal - last 8/08 w/ TScores -2.6 to -3.2;  Vit D level checked by GYN in past & was 53 here 6/09...  ~  12/09:  she reports that she stopped her Fosamax after 12 yrs Rx per DrNeal & he has rec Reclast but she is undecided & inclined to wait til next BMD and see if there has been any deterioration in measured bone density...  CVA (ICD-434.91) - Hosp 9/10 w/ blurry vision & exam showing mild field cut & left eye strabismus... eval by Neurology/ stroke team showed tiny right occipital infarct, diffuse intra /extracranial atherosclerotic dis (but no focal stenoses or interventionable lesions) & a part left 3rd nerve palsy as well... PLAVIX was added to her ASA therapy... SEE DC SUMMARY  CEREBRAL ATROPHY (ICD-331.9) - CTBrain 10/07 showed mild atrophy, otherw neg... she denies memory problems- with minor changes on MMSE testing... 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  ~  6/10: when asked about her memory she states "it depends on what day it is"  ~  1/11:  I see signs of mild deterioration in memory but she denies- eg. she couldn't recall what DrTMartin told her at [facility] about her 3rd nerve paresis, and she insisted that her mother lived to 75, then later corrected herself (she live to 95).  ANXIETY (ICD-300.00) - she has ALPRAZOLAM for Prn use but doesn't take it...   Current Medications (verified): 1)  Plavix 75 Mg Tabs (Clopidogrel Bisulfate) .... Take 1 Tab By Mouth Once Daily.Marland KitchenMarland Kitchen 2)  Amlodipine  Besylate 10 Mg Tabs (Amlodipine Besylate) .... Take 1 Tablet By Mouth Once A Day 3)  Fish Oil 1000 Mg  Caps (Omega-3 Fatty Acids) .Marland Kitchen.. 1 Cap Daily 4)  Metamucil 30.9 %  Powd (Psyllium) .Marland Kitchen.. 1 Tsp Daily 5)  Glucosamine Hcl 1000 Mg  Tabs (Glucosamine Hcl) .... Take 1 Tablet By Mouth Once A Day 6)  Calcium 500/d 500-400 Mg-Unit  Chew (Calcium-Vitamin D) .Marland Kitchen.. 1 Tab Bid 7)  Multivitamins   Tabs (Multiple Vitamin) .Marland Kitchen.. 1 Tab Daily 8)  Vitamin D 1000 Unit  Tabs (Cholecalciferol) .... Take 1 Tablet By Mouth Once A Day 9)  Ester-C 1000-50 Mg  Tabs (Bioflavonoid Products) .... Take 1 Tablet By Mouth Once A Day 10)  Alprazolam 0.25 Mg Tabs (Alprazolam) .... Take 1/2 To 1 Tab By Mouth Every 6 H As Needed For Nerves...  Allergies: 1)  ! Septra (Sulfamethoxazole-Trimethoprim) 2)  ! * Shellfish 3)  !  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) ALLERGIC RHINITIS DUE TO OTHER ALLERGEN (ICD-477.8) HYPERTENSION, MILD (ICD-401.1) MITRAL VALVE PROLAPSE (ICD-424.0) PERIPHERAL VASCULAR DISEASE (ICD-443.9) VENOUS INSUFFICIENCY (ICD-459.81) HYPERCHOLESTEROLEMIA, BORDERLINE (ICD-272.4) GOITER, UNSPECIFIED (ICD-240.9) DIVERTICULOSIS OF COLON (ICD-562.10) HEMORRHOIDS (ICD-455.6) UNSPECIFIED CYSTITIS (ICD-595.9) OSTEOARTHRITIS (ICD-715.90) OSTEOPOROSIS (ICD-733.00) CVA (ICD-434.91) CEREBRAL ATROPHY (ICD-331.9) ANXIETY (ICD-300.00)  Past Surgical History: Lumpectomy - S/P Benign Breast Biopsy in 1960's (Cyst) Cholecystectomy - 1980 at [facility]. Appendectomy - [facility] at [facility]. Hysterectomy - S/P Hyst & rectocele repair by DrBarker 5/82. Oculoplastic Eye Surg 10/99 in [place]. Hx VV stripping years ago  Family History: Reviewed history from 12/02/2008 and no changes required. No FH of Colon Cancer: Diverticulitis-mother mother deceased age 40 from old age   father deceased age 65 from a stroke--obesity 1 brother deceased age 70  from car accident 1 brother deceased age 12 from lung cancer  Social History: Reviewed history from 09/23/2008 and no changes required. 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  The patient denies anorexia, fever, weight loss, weight gain, vision loss, decreased hearing, hoarseness, chest pain, syncope, dyspnea on exertion, prolonged cough, headaches, hemoptysis, abdominal pain, melena, hematochezia, severe indigestion/heartburn, hematuria, incontinence, muscle weakness, suspicious skin lesions, transient blindness, difficulty walking, depression, unusual weight change, abnormal bleeding, enlarged lymph nodes, and angioedema.    Vital Signs:  Patient profile:   77 year old female Height:      61 inches Weight:      106.31 pounds BMI:     20.16 O2 Sat:      97 % on Room air Temp:     97.9 degrees F oral Pulse rate:   73 / minute BP sitting:   110 / 60  (right arm) Cuff size:   regular  Vitals Entered By: Kandice Hams CMA (October 05, 2009 9:32 AM)  O2 Flow:  Room air  Physical Exam  Additional Exam:  WD,Thin, 77 y/o WF in NAD... she is somewhat anxious today... GENERAL:  Alert & oriented; pleasant & cooperative... HEENT:  [place]/AT, EOM- full & WNL, EACs-clear, TMs-wnl, NOSE-pale w/ clear discharge, THROAT-clear & WNL. NECK:  Supple w/ fairROM; no JVD; normal carotid impulses w/o bruits; palp thyroid w/o change, no lymphadenopathy. CHEST:  Decr BS bilat, clear- no wheezes/ rales/ rhonchi... HEART:  Regular Rhythm; without murmurs/ rubs/ or gallops heard... ABDOMEN:  Soft & nontender; normal bowel sounds; no organomegaly or masses detected. EXT: without deformities, mild arthritic changes; no varicose veins/  +venous insuffic/ no edema. NEURO: CN's intact & no other neuro deficits. SKIN:  neg- w/o lesions...    MISC. Report  Procedure date:  10/05/2009  Findings:      BMP (METABOL)   Sodium                    139 mEq/L                   135-145   Potassium                 4.6 mEq/L  3.5-5.1   Chloride                  105 mEq/L                   96-112   Carbon Dioxide            30 mEq/L                    19-32   Glucose                   97 mg/dL                    16-10   BUN                       17 mg/dL                    9-60   Creatinine                0.9 mg/dL                   4.5-4.0   Calcium                   9.6 mg/dL                   9.8-11.9   GFR                       65.14 mL/min                >60  Hepatic/Liver Function Panel (HEPATIC)   Total Bilirubin           0.6 mg/dL                   1.4-7.8   Direct Bilirubin          0.1 mg/dL                   2.9-5.6   Alkaline Phosphatase      83 U/L                      39-117   AST                       25 U/L                      0-37   ALT                       19 U/L                      0-35   Total Protein             7.2 g/dL                    2.1-3.0   Albumin                   4.3 g/dL                    8.6-5.7  CBC Platelet w/Diff (CBCD)   White Cell Count          6.0 K/uL  4.5-10.5   Red Cell Count            4.21 Mil/uL                 3.87-5.11   Hemoglobin                14.2 g/dL                   16.1-09.6   Hematocrit                40.3 %                      36.0-46.0   MCV                       95.9 fl                     78.0-100.0   Platelet Count            259.0 K/uL                  150.0-400.0   Neutrophil %              68.7 %                      43.0-77.0   Lymphocyte %              15.9 %                      12.0-46.0   Monocyte %                9.2 %                       3.0-12.0   Eosinophils%         [H]  5.5 %                        0.0-5.0   Basophils %               0.7 %                       0.0-3.0  Comments:      Lipid Panel (LIPID)   Cholesterol          [H]  212 mg/dL                   0-454   Triglycerides             66.0 mg/dL                  0.9-811.9   HDL                       14.78 mg/dL                 >29.56 Cholesterol LDL - Direct                             132.7 mg/dL  TSH (TSH)   FastTSH                   1.40 uIU/mL  0.35-5.50   Impression & Recommendations:  Problem # 1:  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  Problem # 2:  HYPERTENSION, MILD (ICD-401.1) Controlled>  same med. Her updated medication list for this problem includes:    Amlodipine Besylate 10 Mg Tabs (Amlodipine besylate) .Marland Kitchen... Take 1 tablet by mouth once a day  Orders: TLB-BMP (Basic Metabolic Panel-BMET) (80048-METABOL) TLB-Hepatic/Liver Function Pnl (80076-HEPATIC) TLB-CBC Platelet - w/Differential (85025-CBCD) TLB-Lipid Panel (80061-LIPID) TLB-TSH (Thyroid Stimulating Hormone) (84443-TSH)  Problem # 3:  PERIPHERAL VASCULAR DISEASE (ICD-443.9) She remains on Plavix...  Problem # 4:  VENOUS INSUFFICIENCY (ICD-459.81) c/o LE edema late in the day... doesn't want diuretic rx... we discussed the need for sodium restrict, elevate legs, support hose etc...  Problem # 5:  GOITER, UNSPECIFIED (ICD-240.9) TFT's remain normal,,,  Problem # 6:  OSTEOPOROSIS (ICD-733.00) This is monitored and Rx by GYN DrNeal... continue Rx under his direction...  Problem # 7:  CEREBRAL ATROPHY (ICD-331.9) Aware- she declines Aricept etc...  Problem # 8:  OTHER MEDICAL PROBLEMS AS NOTED>>>  Complete Medication List: 1)  Plavix 75 Mg Tabs (Clopidogrel bisulfate) .... Take 1 tab by mouth once daily.Marland KitchenMarland Kitchen 2)  Amlodipine Besylate 10 Mg Tabs (Amlodipine besylate) .... Take 1 tablet by mouth once a day 3)  Fish Oil 1000 Mg Caps (Omega-3 fatty acids) .Marland Kitchen.. 1 cap daily 4)   Metamucil 30.9 % Powd (Psyllium) .Marland Kitchen.. 1 tsp daily 5)  Glucosamine Hcl 1000 Mg Tabs (Glucosamine hcl) .... Take 1 tablet by mouth once a day 6)  Calcium 500/d 500-400 Mg-unit Chew (Calcium-vitamin d) .Marland Kitchen.. 1 tab bid 7)  Multivitamins Tabs (Multiple vitamin) .Marland Kitchen.. 1 tab daily 8)  Vitamin D 1000 Unit Tabs (Cholecalciferol) .... Take 1 tablet by mouth once a day 9)  Ester-c 1000-50 Mg Tabs (Bioflavonoid products) .... Take 1 tablet by mouth once a day 10)  Alprazolam 0.25 Mg Tabs (Alprazolam) .... Take 1/2 to 1 tab by mouth every 6 h as needed for nerves...  Patient Instructions: 1)  Today we updated your med list- see below.... 2)  Continue your current medications the same... 3)  Today we did your follow up FASTING blood work... please call the "phone tree" in a few days for your lab results.Marland KitchenMarland Kitchen 4)  Keep up the good work w/ your exercise program... 5)  Call for any problems.Marland KitchenMarland Kitchen 6)  Please schedule a follow-up appointment in 6 months.

## 2010-04-29 NOTE — Letter (Signed)
Summary: [facility]  [facility]   Imported By: Sherian Rein 11/19/2009 10:15:11  _____________________________________________________________________  External Attachment:    Type:   Image     Comment:   External Document

## 2010-04-29 NOTE — Letter (Signed)
Summary: [facility]  [facility]   Imported By: Lennie Odor 12/23/2009 14:13:11  _____________________________________________________________________  External Attachment:    Type:   Image     Comment:   External Document

## 2010-04-29 NOTE — Assessment & Plan Note (Signed)
Summary: F/U 3 MONTHS///KP   Primary Care Provider:  Alroy Dust, MD  CC:  3 month ROV & review of mult medical problems....  History of Present Illness: 77 y/o WF here for a follow up visit... she has multiple medical problems as noted below...    ~  Dec09: she had a left THR in Feb09 by DrAlusio, and is improved since then... she reports that she is off the Fosamax per DrNeal- she's been on it for 12 yrs and she understood him to say that it doesn't do much after 6 yrs of therapy... he rec Reclast yearly IV infusion but she is not sure that she wants this & I have encouraged her to continue the dialog w/ DrNeal (he does her BMD studies)...  ~  Jun10:  she saw GI last month w/ some abd pain- now resolved after Miralax Rx & stopping "pimento cheese"... she brought an article about a gluten free diet & all the benefits to be derived including her bones... 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   ~  [facility] 9/6-10/10 w/ blurry vision & exam showing mild field cut & left eye strabismus... eval by Neurology/ stroke team showed tiny right occipital infarct, diffuse intra /extracranial atherosclerotic dis (but no focal stenoses or interventionable lesions) & a part left 3rd nerve palsy as well... PLAVIX was added to her ASA therapy... SEE DC SUMMARY.   ~  December 30, 2008:  she has been stable since disch but quite distressed by the left 3rd N palsy & strabismus... she is very anxious about her situation... I have tried to reassure her & have recommended f/u w/ Neurology- DrSethi, and w/ Ophthalmology- DrTMartin [phone]... we will try to set these up.   ~  March 30, 2009:  her strabismus has resolved as predicted by DrTMartin, Ophthalmology at [facility] & she is pleased... still rather stressed w/ mult somatic complaints... notes mild edema on the Norvasc10 but doesn't want diuretic added to to excess urination already... she still goes to the Gym regularly... she is  on PLAVIX per DrSethi but stopped the ASA per his suggestion...   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 on ASA + Plavix at disch...  ~  11/10:  f/u DrSethi stopped ASA, continued Plavix for her cerebrovasc dis...  ~  11/10:  [facility]- he predicted good prognosis for spont recovery of left eye movement.  ~  1/11:  her left 3rd nerve palsy has resolved...  ALLERGIC RHINITIS (ICD-477.8) - uses OTC antihistamines infrequently...  HYPERTENSION, MILD (ICD-401.1) - she has mild HBP and started on NORVASC 10mg /d... BP= 138/70 today and 130's/ 70's at home... denies HA, fatigue, CP, palipit, dizziness, syncope, dyspnea; min edema noted...  ? of MITRAL VALVE PROLAPSE (ICD-424.0) - Clinical Dx - baseline EKG w/ NSR, WNL;  Neg stress thallium 4/93... she denies CP, palpit, SOB, etc... she exercises regularly walking 3-4 miles and going to the gym.  ~  2DEcho 9/10 showed trivial prolapse of the post leaflet, norm LVF w/ EF= 65-70%, no regional wall motion abnormalities...  PERIPHERAL VASCULAR DISEASE (ICD-443.9) - now on PLAVIX 75mg /d... CTAbd 8/06 showed thickening of Ao wall & prob plaque;  CDopplers 7/08 showed 0-39% bilat ICA stenoses & plaque in Rt subclavian art (plus Rt Thyroid Mass).  ~  MRI/ MRA Brain &  Neck 9/10 showed intra & extracranial atherosclerotic changes w/o interventionable lesions & no hemodynamically signif stenoses in the neck...  VENOUS INSUFFICIENCY (ICD-459.81) - she follows a low sodium diet... no edema at this time...  GOITER, UNSPECIFIED (ICD-240.9) - Old CXR's show deviation of trachea to left by thyroid & CDoppler showed right thyroid mass... she has no local symptoms, swallowing difficulty & is clinically euthyroid...   ~  labs 6/09 showed TSH= 1.04  ~  labs 6/10 showed TSH= 0.98  DIVERTICULOSIS OF COLON (ICD-562.10) & HEMORRHOIDS (ICD-455.6) -  Hx of very tortuous & redundant colon w/ severe sigmoid diverticulosis;  last colonoscopy w/ pediatric scope 10/98 was difficult & subseq barium enema showed severe divertics otherwise neg;  Rx fiber, anusol, etc...  CTAbd 1/03 w/ divertics otherwise neg;  she is reminded to take the [facility] regularly.  UNSPECIFIED CYSTITIS (ICD-595.9) - Urology eval 8/09 by DrDalstadt...  OSTEOARTHRITIS (ICD-715.90) - DJD in both hips L>Rt w/ LTHR done 2/09 by DrAlusio...   OSTEOPOROSIS (ICD-733.00) - BMD by DrNeal 7/02 showed TScores -2.3 to -3.2;  regular f/u BMD from DrNeal - last 8/08 w/ TScores -2.6 to -3.2;  Vit D level checked by GYN in past & was 53 here 6/09...  ~  12/09:  she reports that she stopped her Fosamax after 12 yrs Rx per DrNeal & he has rec Reclast but she is undecided & inclined to wait til next BMD and see if there has been any deterioration in measured bone density...  CVA (ICD-434.91) - Hosp 9/10 w/ blurry vision & exam showing mild field cut & left eye strabismus... eval by Neurology/ stroke team showed tiny right occipital infarct, diffuse intra /extracranial atherosclerotic dis (but no focal stenoses or interventionable lesions) & a part left 3rd nerve palsy as well... PLAVIX was added to her ASA therapy... SEE DC SUMMARY  CEREBRAL ATROPHY (ICD-331.9) - CTBrain 10/07 showed mild atrophy, otherw neg... she denies memory problems- with minor changes on MMSE testing... 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  ~  6/10: when asked about her memory she states "it depends on what day it is"  ~  1/11:  I see signs of mild deterioration in memory but she denies- eg. she couldn't recall what drTMartin told her at [facility] about her 3rd nerve paresis, and she insisted that her mother lived to 33, then later corrected herself (she live to 95).  ANXIETY (ICD-300.00) - she has ALPRAZOLAM for Prn use but doesn't take it...    Allergies: 1)  ! Septra  (Sulfamethoxazole-Trimethoprim) 2)  ! * Shellfish 3)  ! Augmentin  Comments:  Nurse/Medical Assistant: The patient's medications and allergies were reviewed with the patient and were updated in the Medication and Allergy Lists.  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) ALLERGIC RHINITIS DUE TO OTHER ALLERGEN (ICD-477.8) HYPERTENSION, MILD (ICD-401.1) MITRAL VALVE PROLAPSE (ICD-424.0) PERIPHERAL VASCULAR DISEASE (ICD-443.9) VENOUS INSUFFICIENCY (ICD-459.81) GOITER, UNSPECIFIED (ICD-240.9) DIVERTICULOSIS OF COLON (ICD-562.10) HEMORRHOIDS (ICD-455.6) UNSPECIFIED CYSTITIS (ICD-595.9) OSTEOARTHRITIS (ICD-715.90) OSTEOPOROSIS (ICD-733.00) CVA (ICD-434.91) CEREBRAL ATROPHY (ICD-331.9) ANXIETY (ICD-300.00)  Past Surgical History: Lumpectomy - S/P Benign Breast Biopsy in 1960's (Cyst) [facility]cyst[facility] - [facility] at [facility]. Appendectomy - 1980 at [facility]. Hysterectomy - S/P Hyst & rectocele repair by DrBarker 5/82. Oculoplastic Eye Surg 10/99 in [place]. Hx VV stripping years ago  Family History: Reviewed history from 12/02/2008 and no changes required. No FH of Colon Cancer: Diverticulitis-mother mother deceased age 46  from old age   father deceased age 99 from a stroke--obesity 1 brother deceased age 78 from car accident 1 brother deceased age 1 from lung cancer  Social History: Reviewed history from 09/23/2008 and no changes required. 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  The patient denies anorexia, fever, weight loss, weight gain, vision loss, decreased hearing, hoarseness, chest pain, syncope, dyspnea on exertion, prolonged  cough, headaches, hemoptysis, abdominal pain, melena, hematochezia, severe indigestion/heartburn, hematuria, incontinence, muscle weakness, suspicious skin lesions, transient blindness, difficulty walking, depression, unusual weight change, abnormal bleeding, enlarged lymph nodes, and angioedema.    Vital Signs:  Patient profile:   77 year old female Height:      61 inches Weight:      108.13 pounds O2 Sat:      96 % on Room air Temp:     97.0 degrees F oral Pulse rate:   78 / minute BP sitting:   138 / 70  (left arm) Cuff size:   regular  Vitals Entered By: Randell Loop CMA (March 30, 2009 9:28 AM)  O2 Sat at Rest %:  96 O2 Flow:  Room air CC: 3 month ROV & review of mult medical problems... Pain Assessment Patient in pain? no      Comments meds updated today   Physical Exam  Additional Exam:  WD,Thin, 76 y/o WF in NAD... she is somewhat anxious today... GENERAL:  Alert & oriented; pleasant & cooperative... HEENT:  [place]/AT, EOM- full & WNL, EACs-clear, TMs-wnl, NOSE-pale w/ clear discharge, THROAT-clear & WNL. NECK:  Supple w/ fairROM; no JVD; normal carotid impulses w/o bruits; palp thyroid w/o change, no lymphadenopathy. CHEST:  Decr BS bilat, clear- no wheezes/ rales/ rhonchi... HEART:  Regular Rhythm; without murmurs/ rubs/ or gallops heard... ABDOMEN:  Soft & nontender; normal bowel sounds; no organomegaly or masses detected. EXT: without deformities, mild arthritic changes; no varicose veins/ +venous insuffic/ no edema. NEURO: CN's intact & no other neuro deficits. SKIN:  neg- w/o lesions...     MISC. Report  Procedure date:  03/30/2009  Findings:        Cholesterol               199 mg/dL                   1-610   Triglycerides             109.0 mg/dL                 9.6-045.4   HDL                       09.81 mg/dL                 >19.14   VLDL Cholesterol          21.8 mg/dL                  7.8-29.5   LDL Cholesterol      [H]  621 mg/dL  0-99     Sodium                    138 mEq/L                   135-145   Potassium                 3.9 mEq/L                   3.5-5.1   Chloride                  100 mEq/L                   96-112   Carbon Dioxide            32 mEq/L                    19-32   Glucose                   97 mg/dL                    14-78   BUN                       13 mg/dL                    2-95   Creatinine                0.7 mg/dL                   6.2-1.3   Calcium                   9.6 mg/dL                   0.8-65.7   GFR                       84.94 mL/min                >60     White Cell Count          4.7 K/uL                    4.5-10.5   Red Cell Count            4.07 Mil/uL                 3.87-5.11   Hemoglobin                13.8 g/dL                   84.6-96.2   Hematocrit                40.5 %                      36.0-46.0   MCV                       99.4 fl                     78.0-100.0   Platelet Count            231.0 K/uL  Neutrophil %              68.6 %                      43.0-77.0   Lymphocyte %              17.2 %                      12.0-46.0   Monocyte %                10.8 %                      3.0-12.0   Eosinophils%              2.7 %                       0.0-5.0   Basophils %               0.7 %    Comments:        Total Bilirubin           0.8 mg/dL                   1.6-1.0   Direct Bilirubin          0.1 mg/dL                   9.6-0.4   Alkaline Phosphatase      84 U/L                      39-117   AST                       23 U/L                      0-37   ALT                       20 U/L                      0-35   Total Protein             7.2 g/dL                    5.4-0.9   Albumin                   4.0 g/dL                    8.1-1.9     FastTSH                   1.22 uIU/mL                 0.35-5.50   Impression & Recommendations:  Problem # 1:  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  Problem # 2:  HYPERTENSION, MILD (ICD-401.1) Controlled on med... rec continue same... offered low dose diuretic but she declines... Her updated medication list for this problem includes:    Amlodipine Besylate 10 Mg Tabs (Amlodipine besylate) .Marland Kitchen... Take 1 tablet by mouth once a day  Orders: Venipuncture (14782) TLB-Lipid Panel (80061-LIPID) TLB-BMP (  Basic Metabolic Panel-BMET) (80048-METABOL) TLB-CBC Platelet - w/Differential (85025-CBCD) TLB-Hepatic/Liver Function Pnl (80076-HEPATIC) TLB-TSH (Thyroid Stimulating Hormone) (84443-TSH) T-Vitamin D (25-Hydroxy) (40981-19147)  Problem # 3:  PERIPHERAL VASCULAR DISEASE (ICD-443.9) She is on the PLAVIX 75mg /d...   Problem # 4:  VENOUS INSUFFICIENCY (ICD-459.81) There is no edema on today's exam... pt is reassured- asked to elim all sodium, etc...  Problem # 5:  GOITER, UNSPECIFIED (ICD-240.9) No change- exam unchanged and TFT is WNL.Marland Kitchen. she does not want further eval & we will follow...  Problem # 6:  DIVERTICULOSIS OF COLON (ICD-562.10) GI is stable-  no change.  Problem # 7:  OSTEOPOROSIS (ICD-733.00) Followed by her GYN= DrNeal...  Problem # 8:  CVA (ICD-434.91) She remains on Plavix but has stopped the ASA based on DrSethi recommendations... Her updated medication list for this problem includes:    Adult Aspirin Low Strength 81 Mg Tbdp (Aspirin) .Marland Kitchen... 1 tab daily    Plavix 75 Mg Tabs (Clopidogrel bisulfate) .Marland Kitchen... Take 1 tab by mouth once daily...  Problem # 9:  CEREBRAL ATROPHY (ICD-331.9) No change by her description but I wonder if there might not be some subtle deterioration....  Complete Medication List: 1)  Adult Aspirin Low Strength 81 Mg Tbdp (Aspirin) .Marland Kitchen.. 1 tab daily 2)  Plavix 75 Mg Tabs (Clopidogrel bisulfate) .... Take 1 tab by mouth once daily.Marland KitchenMarland Kitchen 3)  Amlodipine Besylate 10 Mg Tabs (Amlodipine besylate) .... Take 1 tablet by mouth once a day 4)  Fish Oil 1000 Mg Caps (Omega-3 fatty  acids) .Marland Kitchen.. 1 cap daily 5)  Metamucil 30.9 % Powd (Psyllium) .Marland Kitchen.. 1 tsp daily 6)  Glucosamine Hcl 1000 Mg Tabs (Glucosamine hcl) .... Take 1 tablet by mouth once a day 7)  Calcium 500/d 500-400 Mg-unit Chew (Calcium-vitamin d) .Marland Kitchen.. 1 tab bid 8)  Multivitamins Tabs (Multiple vitamin) .Marland Kitchen.. 1 tab daily 9)  Vitamin D 1000 Unit Tabs (Cholecalciferol) .... Take 1 tablet by mouth once a day 10)  Ester-c 1000-50 Mg Tabs (Bioflavonoid products) .... Take 1 tablet by mouth once a day 11)  Alprazolam 0.25 Mg Tabs (Alprazolam) .... Take 1/2 to 1 tab by mouth every 6 h as needed for nerves...  Other Orders: Prescription Created Electronically [phone])  Patient Instructions: 1)  Today we updated your med list- see below.... 2)  We refilled your meds for 2011... 3)  Continue your exercise program... 4)  Today we did your FASTING blood work... please call the "phone tree" in a few days for your lab results.Marland KitchenMarland Kitchen 5)  Call for any questions.Marland KitchenMarland Kitchen 6)  Please schedule a follow-up appointment in 6 months. Prescriptions: ALPRAZOLAM 0.25 MG TABS (ALPRAZOLAM) take 1/2 to 1 tab by mouth every 6 H as needed for nerves...  #100 x 5   Entered and Authorized by:   Michele Mcalpine MD   Signed by:   Michele Mcalpine MD on 03/30/2009   Method used:   Print then Give to Patient   RxID:   [id number] AMLODIPINE BESYLATE 10 MG TABS (AMLODIPINE BESYLATE) Take 1 tablet by mouth once a day  #30 x prn   Entered and Authorized by:   Michele Mcalpine MD   Signed by:   Michele Mcalpine MD on 03/30/2009   Method used:   Print then Give to Patient   RxID:   [id number] PLAVIX 75 MG TABS (CLOPIDOGREL BISULFATE) take 1 tab by mouth once daily...  #30 x prn   Entered and Authorized by:   Michele Mcalpine MD  Signed by:   Michele Mcalpine MD on 03/30/2009   Method used:   Print then Give to Patient   RxID:   [id number]

## 2010-05-05 NOTE — Progress Notes (Signed)
  Phone Note Other Incoming   Request: Send information Summary of Call: Mailed labs from 04/26/10 per patients request.

## 2010-05-05 NOTE — Assessment & Plan Note (Signed)
Summary: 6 months/apc   Primary Care Provider:  Alroy Dust, MD  CC:  6 month ROV & review of mult medical problems....  History of Present Illness: 77 y/o WF here for a follow up visit... [name] has multiple medical problems as noted below...  Followed for general medical purposes w/ hx of HBP, ?MVP, ASPVD, Goiter, Divertics, DJD w/ left THR, Osteoporosis, Stoke & 3rd N paresis, Memory loss, & Anxiety...   ~  March 30, 2009:  her strabismus has resolved as predicted by DrTMartin, Ophthalmology at [facility] & [name] is pleased... still rather stressed w/ mult somatic complaints... notes mild edema on the Norvasc10 but doesn't want diuretic added to to excess urination already... [name] still goes to the Gym regularly... [name] is on PLAVIX per DrSethi but stopped the ASA per his suggestion...   ~  October 05, 2009:  [name] c/o some edema in LEs late in the day "it's due to my vein stripping yrs ago" & we reviewed no salt, elevation & support hose...  BP checks have all been good, denies CP/ palpit/ SOB, etc... overall stable>    ~  April 26, 2010:    6 month ROV- stable overall but [name] stopped the Plavix on her own (due to some minor bruising) & we discussed restarting this medication due to prev stroke & risk of recurrence.    [name] has neck pain & saw Neurosurg (?we don't have notes) & told it was a bone spur & might need surg; for now heating pad, Tylenol, phys therapy; [name] exercises at the gym regularly w/ Natividad Brood.    BP controlled on Norvasc;  denies CP, palpit, SOB, edema;  FLP is OK on diet alone, & [name] declines low dose statin med;  [name] seems to be managing reasonably well...   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 eval by Neurology/ stroke team showed tiny right occipital infarct, diffuse intra /extracranial atherosclerotic dis (but no focal stenoses or interventionable  lesions) & a part left 3rd nerve palsy as well... PLAVIX was added to her ASA therapy...  ~  11/10:  f/u DrSethi stopped ASA, continued Plavix for her cerebrovasc dis...  ~  11/10:  saw [facility]TM[facility]in at [facility]- he predicted good prognosis for spont recovery of left eye movement.  ~  1/11:  her left 3rd nerve palsy has resolved...  ALLERGIC RHINITIS (ICD-477.8) - uses OTC antihistamines infrequently...  HYPERTENSION, MILD (ICD-401.1) - [name] has mild HBP and started on NORVASC 10mg /d... BP= 140/70 today and 130's/ 70's at home... denies HA, fatigue, CP, palipit, dizziness, syncope, dyspnea; min edema noted...  ? of MITRAL VALVE PROLAPSE (ICD-424.0) - Clinical Dx - baseline EKG w/ NSR, WNL;  Neg stress thallium 4/93... [name] denies CP, palpit, SOB, etc... [name] exercises regularly walking 3-4 miles and going to the gym.  ~  2DEcho 9/10 showed trivial prolapse of the post leaflet, norm LVF w/ EF= 65-70%, no regional wall motion abnormalities...  PERIPHERAL VASCULAR DISEASE (ICD-443.9) - now on PLAVIX 75mg /d... CTAbd 8/06 showed thickening of Ao wall & prob plaque;  CDopplers 7/08 showed 0-39% bilat ICA stenoses & plaque in Rt subclavian art (plus Rt Thyroid Mass).  ~  MRI/ MRA Brain & Neck 9/10 showed intra & extracranial atherosclerotic changes w/o interventionable lesions & no hemodynamically signif stenoses in the neck...  VENOUS INSUFFICIENCY (ICD-459.81) - [name] follows a  low sodium diet... no edema at this time...  HYPERCHOLESTEROLEMIA, BORDERLINE (ICD-272.4) - on diet + FISH OIL, [name] does not want meds.  ~  FLP 1/11 showed TChol 199, TG 109, HDL 66, LDL 112  ~  FLP 7/11 showed TChol 212, TG 66, HDL 69, LDL 133  ~  FLP 1/12 showed TChol 208, TG 106, HDL 66, LDL 124  GOITER, UNSPECIFIED (ICD-240.9) - Old CXR's show deviation of trachea to left by thyroid & CDoppler showed right thyroid mass... [name] has no local symptoms, swallowing difficulty & is clinically euthyroid...   ~  labs 6/09 showed TSH= 1.04  ~   labs 6/10 showed TSH= 0.98  ~  labs 7/11 showed TSH= 1.40  DIVERTICULOSIS OF COLON (ICD-562.10) & HEMORRHOIDS (ICD-455.6) - Hx of very tortuous & redundant colon w/ severe sigmoid diverticulosis;  last colonoscopy w/ pediatric scope 10/98 was difficult & subseq barium enema showed severe divertics otherwise neg;  Rx fiber, anusol, etc...  CTAbd 1/03 w/ divertics otherwise neg;  [name] is reminded to take the MIRALAX/ Metamucil regularly.  UNSPECIFIED CYSTITIS (ICD-595.9) - Urology eval 8/09 by DrDalstadt...  OSTEOARTHRITIS (ICD-715.90) - DJD in both hips L>Rt w/ LTHR done 2/09 by DrAlusio... [name] takes Glucosamine, Calcium, MVI, VitC & VitD.   OSTEOPOROSIS (ICD-733.00) - BMD by DrNeal 7/02 showed TScores -2.3 to -3.2;  regular f/u BMD from DrNeal - last 8/08 w/ TScores -2.6 to -3.2;  Vit D level checked by GYN in past & was 53 here 6/09...  ~  12/09:  [name] reports that [name] stopped her Fosamax after 12 yrs Rx per DrNeal & he has rec Reclast but [name] is undecided & inclined to wait til next BMD and see if there has been any deterioration in measured bone density...  CVA (ICD-434.91) - Hosp 9/10 w/ blurry vision & exam showing mild field cut & left eye strabismus... eval by Neurology/ stroke team showed tiny right occipital infarct, diffuse intra /extracranial atherosclerotic dis (but no focal stenoses or interventionable lesions) & a part left 3rd nerve palsy as well... PLAVIX was added to her ASA therapy... SEE DC SUMMARY  CEREBRAL ATROPHY (ICD-331.9) - CTBrain 10/07 showed mild atrophy, otherw neg... [name] denies memory problems- with minor changes on MMSE testing... 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  ~  6/10: when asked about her memory [name] states "it depends on what day it is"  ~  1/11:  I see signs of mild deterioration in memory but [name] denies- eg. [name] couldn't recall what DrTMartin told her at [facility] about her 3rd nerve paresis, and [name] insisted that her mother lived to 85,  then later corrected herself ([name] live to 95).  ANXIETY (ICD-300.00) - [name] has severe stress having found her son at home after a suicide... [name] has ALPRAZOLAM for Prn use but doesn't take it...    Preventive Screening-Counseling & Management  Alcohol-Tobacco     Smoking Status: never  Allergies: 1)  ! Septra 2)  ! * Shellfish 3)  ! Augmentin  Comments:  Nurse/Medical Assistant: The patient's medications and allergies were reviewed with the patient and were updated in the Medication and Allergy Lists.  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) ALLERGIC RHINITIS DUE TO OTHER ALLERGEN (ICD-477.8) HYPERTENSION, MILD (ICD-401.1) MITRAL VALVE PROLAPSE (ICD-424.0) PERIPHERAL VASCULAR DISEASE (ICD-443.9) VENOUS INSUFFICIENCY (ICD-459.81) HYPERCHOLESTEROLEMIA, BORDERLINE (ICD-272.4) GOITER, UNSPECIFIED (ICD-240.9) DIVERTICULOSIS OF COLON (ICD-562.10) HEMORRHOIDS (ICD-455.6) UNSPECIFIED CYSTITIS (ICD-595.9) OSTEOARTHRITIS (ICD-715.90) OSTEOPOROSIS (  ICD-733.00) CVA (ICD-434.91) CEREBRAL ATROPHY (ICD-331.9) ANXIETY (ICD-300.00)  Past Surgical History: Lumpectomy - S/P Benign Breast Biopsy in 1960's (Cyst) Cholecystectomy - [facility] at [facility]. Appendectomy - 1980 at [facility]. Hysterectomy - S/P Hyst & rectocele repair by DrBarker 5/82. Oculoplastic Eye Surg 10/99 in [place]. Hx VV stripping years ago  Family History: Reviewed history from 12/02/2008 and no changes required. No FH of Colon Cancer: Diverticulitis-mother mother deceased age 35 from old age   father deceased age 44 from a stroke--obesity 1 brother deceased age 59 from car accident 1 brother deceased age 6 from lung cancer  Social History: Reviewed history from 09/23/2008 and no changes required. 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  The patient denies anorexia, fever, weight loss, weight gain, vision loss, hoarseness, chest pain, syncope, peripheral edema, prolonged cough, headaches, hemoptysis, abdominal pain, melena, hematochezia, severe indigestion/heartburn, hematuria, incontinence, muscle weakness, suspicious skin lesions, transient blindness, difficulty walking, depression, unusual weight change, abnormal bleeding, enlarged lymph nodes, and angioedema.    Vital Signs:  Patient profile:   77 year old female Height:      61 inches Weight:      108.50 pounds BMI:     20.57 O2 Sat:      97 % on Room air Temp:     97.4 degrees F oral Pulse rate:   70 / minute BP sitting:   140 / 70  (right arm) Cuff size:   regular  Vitals Entered By: Randell Loop CMA (April 26, 2010 9:18 AM)  O2 Sat at Rest %:  97 O2 Flow:  Room air CC: 6 month ROV & review of mult medical problems... Is Patient Diabetic? No Pain Assessment Patient in pain? yes      Onset of pain  neck pain but is in therapy for this Comments no changes in meds today with pt   Physical Exam  Additional Exam:  WD,Thin, 77 y/o WF in NAD... [name] is somewhat anxious today... GENERAL:  Alert & oriented; pleasant & cooperative... HEENT:  [place]/AT, EOM- full & WNL, EACs-clear, TMs-wnl, NOSE-pale w/ clear discharge, THROAT-clear & WNL. NECK:  Supple w/ fairROM; no JVD; normal carotid impulses w/o bruits; palp thyroid w/o change, no lymphadenopathy. CHEST:  Decr BS bilat, clear- no wheezes/ rales/ rhonchi... HEART:  Regular Rhythm; without murmurs/ rubs/ or gallops heard... ABDOMEN:  Soft & nontender; normal bowel sounds; no organomegaly or masses detected. EXT: without deformities, mild arthritic changes; no varicose veins/ +venous insuffic/ no edema. NEURO: CN's  intact & no other neuro deficits. SKIN:  neg- w/o lesions...    Impression & Recommendations:  Problem # 1:  HYPERTENSION, MILD (ICD-401.1) Controlled>  same med. Her updated medication list for this problem includes:    Amlodipine Besylate 10 Mg Tabs (Amlodipine besylate) .Marland Kitchen... Take 1 tablet by mouth once a day  Problem # 2:  CVA (ICD-434.91) [name] is advised to get back on the Plavix daily... Her updated medication list for this problem includes:    Plavix 75 Mg Tabs (Clopidogrel bisulfate) .Marland Kitchen... Take 1 tab by mouth once daily...  Problem # 3:  HYPERCHOLESTEROLEMIA, BORDERLINE (  ICD-272.4) FLP not at goal but reasonable & [name] has repeatedly refused statin Rx... continue diet/ exercise. Fish Oil, etc... Orders: TLB-Lipid Panel ([id number])  Problem # 4:  DIVERTICULOSIS OF COLON (ICD-562.10) GI is stable & [name] takes Metamucil daily...  Problem # 5:  OSTEOARTHRITIS (ICD-715.90) [name] has mod neck pain & apparently saw a neurosurg w/ "bone spur" found> getting PT, & rec heat, tylenol, etc... hopefully [name] will not need to consider surg.  Problem # 6:  OSTEOPOROSIS (ICD-733.00) Followed by GYN, DrNeal... [name] has refused to go back on Fosamax.  Problem # 7:  CEREBRAL ATROPHY (ICD-331.9) Aware>  [name] refuses Aricept or other "memory" meds...  Problem # 8:  ANXIETY (ICD-300.00) [name] has Alpraz for Prn use... Her updated medication list for this problem includes:    Alprazolam 0.25 Mg Tabs (Alprazolam) .Marland Kitchen... Take 1/2 to 1 tab by mouth every 6 h as needed for nerves...  Complete Medication List: 1)  Plavix 75 Mg Tabs (Clopidogrel bisulfate) .... Take 1 tab by mouth once daily.Marland KitchenMarland Kitchen 2)  Amlodipine Besylate 10 Mg Tabs (Amlodipine besylate) .... Take 1 tablet by mouth once a day 3)  Fish Oil 1000 Mg Caps (Omega-3 fatty acids) .Marland Kitchen.. 1 cap daily 4)  Metamucil 30.9 % Powd (Psyllium) .Marland Kitchen.. 1 tsp daily 5)  Glucosamine Hcl 1000 Mg Tabs (Glucosamine hcl) .... Take 1 tablet by mouth once a day 6)   Calcium 500/d 500-400 Mg-unit Chew (Calcium-vitamin d) .Marland Kitchen.. 1 tab bid 7)  Multivitamins Tabs (Multiple vitamin) .Marland Kitchen.. 1 tab daily 8)  Ester-c 1000-50 Mg Tabs (Bioflavonoid products) .... Take 1 tablet by mouth once a day 9)  Vitamin D 1000 Unit Tabs (Cholecalciferol) .... Take 1 tablet by mouth once a day 10)  Alprazolam 0.25 Mg Tabs (Alprazolam) .... Take 1/2 to 1 tab by mouth every 6 h as needed for nerves...  Patient Instructions: 1)  Today we updated your med list- see below.... 2)  We refilled your meds per request... Dont' forget that you may use Tylenol, Advil, etc for pain.Marland KitchenMarland Kitchen 3)  Today we did your follow up Cholesterol check... please call the "phone tree" in a few days for your lab results.Marland KitchenMarland Kitchen 4)  Keep up the good work w/ your exercise program, and the "therapy" for your neck.Marland KitchenMarland Kitchen 5)  Call for any questions.Marland KitchenMarland Kitchen 6)  Please schedule a follow-up appointment in 6 months. Prescriptions: ALPRAZOLAM 0.25 MG TABS (ALPRAZOLAM) take 1/2 to 1 tab by mouth every 6 H as needed for nerves...  #100 x 6   Entered and Authorized by:   Michele Mcalpine MD   Signed by:   Michele Mcalpine MD on 04/26/2010   Method used:   Print then Give to Patient   RxID:   [id number] AMLODIPINE BESYLATE 10 MG TABS (AMLODIPINE BESYLATE) Take 1 tablet by mouth once a day  #90 x 4   Entered and Authorized by:   Michele Mcalpine MD   Signed by:   Michele Mcalpine MD on 04/26/2010   Method used:   Print then Give to Patient   RxID:   [id number] PLAVIX 75 MG TABS (CLOPIDOGREL BISULFATE) take 1 tab by mouth once daily...  #90 x 4   Entered and Authorized by:   Michele Mcalpine MD   Signed by:   Michele Mcalpine MD on 04/26/2010   Method used:   Print then Give to Patient   RxID:   [id number]

## 2010-05-18 ENCOUNTER — Ambulatory Visit (INDEPENDENT_AMBULATORY_CARE_PROVIDER_SITE_OTHER): Payer: Medicare Other | Admitting: Pulmonary Disease

## 2010-05-18 ENCOUNTER — Encounter: Payer: Self-pay | Admitting: Pulmonary Disease

## 2010-05-18 DIAGNOSIS — I739 Peripheral vascular disease, unspecified: Secondary | ICD-10-CM

## 2010-05-18 DIAGNOSIS — I059 Rheumatic mitral valve disease, unspecified: Secondary | ICD-10-CM

## 2010-05-18 DIAGNOSIS — I6350 Cerebral infarction due to unspecified occlusion or stenosis of unspecified cerebral artery: Secondary | ICD-10-CM

## 2010-05-18 DIAGNOSIS — I872 Venous insufficiency (chronic) (peripheral): Secondary | ICD-10-CM

## 2010-05-18 DIAGNOSIS — M542 Cervicalgia: Secondary | ICD-10-CM

## 2010-05-18 DIAGNOSIS — M1990 Unspecified osteoarthritis, unspecified site: Secondary | ICD-10-CM

## 2010-05-18 DIAGNOSIS — I10 Essential (primary) hypertension: Secondary | ICD-10-CM

## 2010-05-18 DIAGNOSIS — H538 Other visual disturbances: Secondary | ICD-10-CM

## 2010-05-18 DIAGNOSIS — G319 Degenerative disease of nervous system, unspecified: Secondary | ICD-10-CM

## 2010-05-18 DIAGNOSIS — E785 Hyperlipidemia, unspecified: Secondary | ICD-10-CM

## 2010-05-25 NOTE — Assessment & Plan Note (Signed)
Summary: OV CHILLS PAIN IN LEFT SIDE OF NECK//SH   Primary Care Provider:  Alroy Dust, MD  CC:  3 week ROV & add-on for neck pain & blurry vision....  History of Present Illness: 77 y/o WF here for a follow up visit... she has multiple medical problems as noted below...  Followed for general medical purposes w/ hx of HBP, mild MVP, ASPVD, Goiter, Divertics, DJD w/ left THR, Osteoporosis, hx right occipital stoke & 3rd N paresis, Memory loss, & mod severe anxiety...   ~  March 30, 2009:  her strabismus has resolved as predicted by DrTMartin, Ophthalmology at [facility] & she is pleased... still rather stressed w/ mult somatic complaints... notes mild edema on the Norvasc10 but doesn't want diuretic added to to excess urination already... she still goes to the Gym regularly... she is on PLAVIX per DrSethi but stopped the ASA per his suggestion "you don't need aspirin if you are on Plavix"...   ~  October 05, 2009:  she c/o some edema in LEs late in the day "it's due to my vein stripping yrs ago" & we reviewed no salt, elevation & support hose...  BP checks have all been good, denies CP/ palpit/ SOB, etc... overall stable> see prob list below...   ~  April 26, 2010:  6 month ROV- stable overall but she stopped the Plavix on her own (due to some minor bruising) & we discussed restarting this medication due to prev stroke & risk of recurrence.    She has neck pain & saw Neurosurg (?we don't have notes) & told it was a bone spur & might need surg; for now heating pad, Tylenol, phys therapy; she exercises at the gym regularly...    BP controlled on Norvasc;  denies CP, palpit, SOB, edema;  FLP is OK on diet alone, & she declines low dose statin med;  she seems to be managing reasonably well...   ~  May 18, 2010:  Add-on for blurred vision & left neck pain w/ incr anxiety evident... states she's worried about her heart w/ these symptoms (known mild MVP, no known CAD) & EKG today is NSR, essent WNL.Marland Kitchen.  she  equates the blurred vision to her stroke 8/11 but she had dysconjugate gaze & 3rd nerve paresis at that time & EOM are normal now... all this hx reviewed w/ the pt & excellent Neuro note from [facility] 9/11 also reviewed w/ her including MRI/ MRA (neg- no acute process), CSpine films (signif DDD & anterolisthesis C4 on C5), etc...  she was prev rec to take Zoloft but she declined, & keeps on perseverating her concerns over heart & stroke> **we decided to treat her neck symptoms w/ rest, heat, Tramadol Prn, and rec f/u neuro check by DrSethi et al (time for 53mo rov)... she would like Ophthalmology check for the blurring of vision but doesn't want to see DrShapiro again> we will [facility] to [facility].   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 eval by Neurology/ stroke team showed tiny right occipital infarct, diffuse intra /extracranial atherosclerotic dis (but no focal stenoses or interventionable lesions) & a part left 3rd nerve palsy as well... PLAVIX was added to her ASA therapy...  ~  11/10:  f/u DrSethi stopped ASA, continued Plavix for her cerebrovasc dis...  ~  11/10:  saw [facility]TMartin at [facility]- he  predicted good prognosis for spont recovery of left eye movement.  ~  1/11:  her left 3rd nerve palsy has resolved...  ALLERGIC RHINITIS (ICD-477.8) - uses OTC antihistamines infrequently...  HYPERTENSION, MILD (ICD-401.1) - she has hx of HBP and on NORVASC 10mg /d... BP= 122/64 today and 130's/ 70's at home... denies HA, fatigue, CP, palipit, dizziness, syncope, dyspnea; min edema noted...  Hx of MITRAL VALVE PROLAPSE (ICD-424.0) - baseline EKG w/ NSR, WNL;  Neg stress thallium 4/93... she denies CP, palpit, SOB, etc... she exercises regularly walking 3-4 miles and going to the gym.  ~  2DEcho 9/10 showed trivial prolapse of the post leaflet, norm LVF w/ EF=  65-70%, no regional wall motion abnormalities...  PERIPHERAL VASCULAR DISEASE (ICD-443.9) - on PLAVIX 75mg /d... CTAbd 8/06 showed thickening of Ao wall & prob plaque;  CDopplers 7/08 showed 0-39% bilat ICA stenoses & plaque in Rt subclavian art (plus Rt Thyroid Mass).  ~  MRI/ MRA Brain & Neck 9/10 showed intra & extracranial atherosclerotic changes w/o interventionable lesions & no hemodynamically signif stenoses in the neck...  VENOUS INSUFFICIENCY (ICD-459.81) - she follows a low sodium diet... no edema at this time...  HYPERCHOLESTEROLEMIA, BORDERLINE (ICD-272.4) - on diet + FISH OIL, she does not want meds.  ~  FLP 1/11 showed TChol 199, TG 109, HDL 66, LDL 112  ~  FLP 7/11 showed TChol 212, TG 66, HDL 69, LDL 133  ~  FLP 1/12 showed TChol 208, TG 106, HDL 66, LDL 124  GOITER, UNSPECIFIED (ICD-240.9) - Old CXR's show deviation of trachea to left by thyroid & CDoppler showed right thyroid mass... she has no local symptoms, swallowing difficulty & is clinically euthyroid...   ~  labs 6/09 showed TSH= 1.04  ~  labs 6/10 showed TSH= 0.98  ~  labs 7/11 showed TSH= 1.40  DIVERTICULOSIS OF COLON (ICD-562.10) & HEMORRHOIDS (ICD-455.6) - Hx of very tortuous & redundant colon w/ severe sigmoid diverticulosis;  last colonoscopy w/ pediatric scope 10/98 was difficult & subseq barium enema showed severe divertics otherwise neg;  Rx fiber, anusol, etc...  CTAbd 1/03 w/ divertics otherwise neg;  she is reminded to take the MIRALAX/ Metamucil regularly.  UNSPECIFIED CYSTITIS (ICD-595.9) - Urology eval 8/09 by DrDalstadt...  OSTEOARTHRITIS (ICD-715.90) - DJD in both hips L>Rt w/ LTHR done 2/09 by DrAlusio... she takes Glucosamine, Calcium, MVI, VitC & VitD.  ~  8/11: c/o neck pain & CSpine films by DrSethi showed DDD, 3mm anterolisthesis C4 on C5, osteopenia...  ~  2/12:  c/o continued neck discomfort ?eval by neurosurg she said?, advised rest, heat, Tramadol Prn.   OSTEOPOROSIS (ICD-733.00) - BMD by  DrNeal 7/02 showed TScores -2.3 to -3.2;  regular f/u BMD from DrNeal - last 8/08 w/ TScores -2.6 to -3.2;  Vit D level checked by GYN in past & was 53 here 6/09...  ~  12/09:  she reports that she stopped her Fosamax after 12 yrs Rx per DrNeal & he has rec Reclast but she is undecided & inclined to wait til next BMD and see if there has been any deterioration in measured bone density...  CVA (ICD-434.91) - Hosp 9/10 w/ blurry vision & exam showing mild field cut & left eye strabismus... eval by Neurology/ stroke team showed tiny right occipital infarct, diffuse intra /extracranial atherosclerotic dis (but no focal stenoses or interventionable lesions) & a part left 3rd nerve palsy as well... PLAVIX was added to her ASA therapy... SEE DC SUMMARY  CEREBRAL ATROPHY (ICD-331.9) -  CTBrain 10/07 showed mild atrophy, otherw neg... she denies memory problems- with minor changes on MMSE testing... 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  ~  6/10: when asked about her memory she states "it depends on what day it is"  ~  1/11:  I see signs of mild deterioration in memory but she denies- eg. she couldn't recall what DrTMartin told her at [facility] about her 3rd nerve paresis, and she insisted that her mother lived to 29, then later corrected herself (she live to 95).  ~  2/12: similar signs of memory impairment w/ her c/o blurry vision & neck pain> she couldn't recall prev w/u by neuro or results.  ANXIETY (ICD-300.00) - she has severe stress having found her son at home after a suicide... she has ALPRAZOLAM for Prn use but doesn't take it... she was rec to try Zoloft per Neuro but she never filled it.   Preventive Screening-Counseling & Management  Alcohol-Tobacco     Smoking Status: never  Allergies: 1)  ! Septra 2)  ! * Shellfish 3)  ! Augmentin  Comments:  Nurse/Medical Assistant: The patient's medications and allergies were reviewed with the patient and were updated in the  Medication and Allergy Lists.  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) ALLERGIC RHINITIS DUE TO OTHER ALLERGEN (ICD-477.8) HYPERTENSION, MILD (ICD-401.1) MITRAL VALVE PROLAPSE (ICD-424.0) PERIPHERAL VASCULAR DISEASE (ICD-443.9) VENOUS INSUFFICIENCY (ICD-459.81) HYPERCHOLESTEROLEMIA, BORDERLINE (ICD-272.4) GOITER, UNSPECIFIED (ICD-240.9) DIVERTICULOSIS OF COLON (ICD-562.10) HEMORRHOIDS (ICD-455.6) UNSPECIFIED CYSTITIS (ICD-595.9) OSTEOARTHRITIS (ICD-715.90) OSTEOPOROSIS (ICD-733.00) CVA (ICD-434.91) CEREBRAL ATROPHY (ICD-331.9) ANXIETY (ICD-300.00)  Past Surgical History: Lumpectomy - S/P Benign Breast Biopsy in 1960's (Cyst) Cholecystectomy - 1980 at [facility]. Appendectomy - 1980 at [facility]. Hysterectomy - S/P Hyst & rectocele repair by DrBarker 5/82. Oculoplastic Eye Surg 10/99 in [place]. Hx VV stripping years ago  Family History: Reviewed history from 12/02/2008 and no changes required. No FH of Colon Cancer: Diverticulitis-mother mother deceased age 67 from old age   father deceased age 70 from a stroke--obesity 1 brother deceased age 85 from car accident 1 brother deceased age 32 from lung cancer  Social History: Reviewed history from 09/23/2008 and no changes required. 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    Vital Signs:  Patient profile:   77 year old female Height:      61 inches Weight:      111.50 pounds BMI:     21.14 O2 Sat:      97 % on Room air Temp:     98.9 degrees F oral Pulse rate:   81 / minute BP sitting:   122 / 64  (left arm) Cuff size:   regular  Vitals Entered By: Randell Loop CMA (May 18, 2010 2:21 PM)  O2 Sat at Rest %:  97 O2 Flow:  Room air CC: 3  week ROV & add-on for neck pain & blurry vision... Is Patient Diabetic? No Pain Assessment Patient in pain? yes      Onset of pain  left side of her neck hurts Comments no changes in meds today with pt   Physical Exam  Additional Exam:  WD,Thin, 77 y/o WF in NAD... she is moderately anxious today... GENERAL:  Alert & oriented; pleasant & cooperative... HEENT:  [place]/AT, EOM- full & WNL, EACs-clear, TMs-wnl, NOSE-pale w/ clear discharge, THROAT-clear & WNL. NECK:  Supple w/ fairROM; no JVD; normal carotid impulses w/o bruits; palp thyroid w/o change, no lymphadenopathy. CHEST:  Decr BS bilat, clear- no wheezes/ rales/ rhonchi... HEART:  Regular Rhythm; without murmurs/ rubs/ or gallops heard... ABDOMEN:  Soft & nontender; normal bowel sounds; no organomegaly or masses detected. EXT: without deformities, mild arthritic changes; no varicose veins/ +venous insuffic/ no edema. NEURO: CN's intact & no other neuro deficits... some decr ROM neck. SKIN:  neg- w/o lesions...    Impression & Recommendations:  Problem # 1:  BLURRED VISION (ICD-368.8) This is one of her CC today but thought it was the same as her presentation 9/10 w/ stroke & she had 3rd N paresis at that time... today there is no dysconjugate gaze, strabismus, CN paresis etc... I tried to reassure her but she doesn't seem to get it> we will proceed w/ Ophthal check & she is due 80mo rov w/ Neuro as well... Orders: Neurology Referral  (Neuro) Ophthalmology Referral (Ophthalmology)  Problem # 2:  NECK PAIN (ICD-723.1) This is her other CC & we reviewed her prev XRay CSpine 8/11 by DrSethi... she indicates that she saw ?Neurosurg? but I can't confirm this (did she mean Neurology)... rec treatment w/ rest, heat, trial of Tramadol Q6H as needed... Her updated medication list for this problem includes:    Tramadol Hcl 50 Mg Tabs (Tramadol hcl) .Marland Kitchen... Take 1 tab by mouth every 6-8 h as needed for pain...  Problem # 3:  CVA (ICD-434.91) She is reminded to stay on the Plavix (recall that she had stopped it on her own last yr)... no cerebral ischemic symptoms evident. Her updated medication list for this problem includes:    Plavix 75 Mg Tabs (Clopidogrel bisulfate) .Marland Kitchen... Take 1 tab by mouth once daily...  Problem # 4:  HYPERTENSION, MILD (ICD-401.1) BP well controlled>  continue current Rx... Her updated medication list for this problem includes:    Amlodipine Besylate 10 Mg Tabs (Amlodipine besylate) .Marland Kitchen... Take 1 tablet by mouth once a day  Problem # 5:  MITRAL VALVE PROLAPSE (ICD-424.0) She has mild MVP on prev 2DEcho but is asymptomatic... she was stressed ?that the neck pain was from her heart?... EKG w/o acute changes & I tried to reassure her... she continues to exercise at gym regularly... Her updated medication list for this problem includes:    Plavix 75 Mg Tabs (Clopidogrel bisulfate) .Marland Kitchen... Take 1 tab by mouth once daily...  Problem # 6:  HYPERCHOLESTEROLEMIA, BORDERLINE (ICD-272.4) Her FLP 1/12 showed TChol 208, LDL 124... she has refused suggestion to get on low dose statin med & prefers diet alone noting that her mother lived into her mid 41s...  Problem # 7:  GOITER, UNSPECIFIED (ICD-240.9) Clinically & biochemically euthyroid...  Problem # 8:  OSTEOPOROSIS (ICD-733.00) Followed by DrNeal, GYN... he has rec Reclast but she has been resistant so far...  Problem # 9:  CEREBRAL ATROPHY (ICD-331.9) Memory loss &  some subtle personality changes... but she has been under  considerable stress ever since her son's suicide. Orders: Neurology Referral (Neuro)  Problem # 10:  ANXIETY (ICD-300.00) As above>  she has Alpraz but hasn't used it... Her updated medication list for this problem includes:    Alprazolam 0.25 Mg Tabs (Alprazolam) .Marland Kitchen... Take 1/2 to 1 tab by mouth every 6 h as needed for nerves...  Complete Medication List: 1)  Plavix 75 Mg Tabs (Clopidogrel bisulfate) .... Take 1 tab by mouth once daily.Marland KitchenMarland Kitchen 2)  Amlodipine Besylate 10 Mg Tabs (Amlodipine besylate) .... Take 1 tablet by mouth once a day 3)  Fish Oil 1000 Mg Caps (Omega-3 fatty acids) .Marland Kitchen.. 1 cap daily 4)  Metamucil 30.9 % Powd (Psyllium) .Marland Kitchen.. 1 tsp daily 5)  Glucosamine Hcl 1000 Mg Tabs (Glucosamine hcl) .... Take 1 tablet by mouth once a day 6)  Calcium 500/d 500-400 Mg-unit Chew (Calcium-vitamin d) .Marland Kitchen.. 1 tab bid 7)  Multivitamins Tabs (Multiple vitamin) .Marland Kitchen.. 1 tab daily 8)  Ester-c 1000-50 Mg Tabs (Bioflavonoid products) .... Take 1 tablet by mouth once a day 9)  Vitamin D 1000 Unit Tabs (Cholecalciferol) .... Take 1 tablet by mouth once a day 10)  Alprazolam 0.25 Mg Tabs (Alprazolam) .... Take 1/2 to 1 tab by mouth every 6 h as needed for nerves... 11)  Tramadol Hcl 50 Mg Tabs (Tramadol hcl) .... Take 1 tab by mouth every 6-8 h as needed for pain...  Patient Instructions: 1)  Today we updated your med list- see below.... 2)  For your NECK PAIN:  Your EKG is normal, this is not your heart... prev XRays by DrSethi showed alot of arthritis & disc disease in your neck... continue the heating pad, & use the new perscription for TRAMADOL one tab every 6H as needed for pain... It is also time for a follow up appt w/ your Neurogist (DrSethi, et al)... 3)  For your blurry vision: we will arrange for an Ophthalmologist to check your eyes.Marland KitchenMarland Kitchen  4)  Continue your other meds.Marland KitchenMarland Kitchen 5)  Keep your prev sched f/u appts... Prescriptions: TRAMADOL HCL 50  MG TABS (TRAMADOL HCL) take 1 tab by mouth every 6-8 H as needed for pain...  #30 x 5   Entered and Authorized by:   Michele Mcalpine MD   Signed by:   Michele Mcalpine MD on 05/18/2010   Method used:   Print then Give to Patient   RxID:   [id number]

## 2010-07-02 LAB — COMPREHENSIVE METABOLIC PANEL
ALT: 20 U/L (ref 0–35)
AST: 27 U/L (ref 0–37)
Albumin: 3.6 g/dL (ref 3.5–5.2)
Alkaline Phosphatase: 83 U/L (ref 39–117)
Alkaline Phosphatase: 84 U/L (ref 39–117)
BUN: 12 mg/dL (ref 6–23)
BUN: 9 mg/dL (ref 6–23)
CO2: 26 mEq/L (ref 19–32)
CO2: 29 mEq/L (ref 19–32)
Calcium: 10.2 mg/dL (ref 8.4–10.5)
Chloride: 94 mEq/L — ABNORMAL LOW (ref 96–112)
Chloride: 96 mEq/L (ref 96–112)
Creatinine, Ser: 0.76 mg/dL (ref 0.4–1.2)
Creatinine, Ser: 0.76 mg/dL (ref 0.4–1.2)
GFR calc Af Amer: >60 mL/min (ref >60)
GFR calc non Af Amer: >60 mL/min (ref >60)
GFR calc non Af Amer: >60 mL/min (ref >60)
Glucose, Bld: 103 mg/dL — ABNORMAL HIGH (ref 70–99)
Glucose, Bld: 98 mg/dL (ref 70–99)
Potassium: 4 mEq/L (ref 3.5–5.1)
Potassium: 4.2 mEq/L (ref 3.5–5.1)
Sodium: 134 mEq/L — ABNORMAL LOW (ref 135–145)
Total Bilirubin: 0.6 mg/dL (ref 0.3–1.2)
Total Bilirubin: 0.9 mg/dL (ref 0.3–1.2)
Total Protein: 6.9 g/dL (ref 6.0–8.3)

## 2010-07-02 LAB — MAGNESIUM: Magnesium: 2 mg/dL (ref 1.5–2.5)

## 2010-07-02 LAB — PROTIME-INR
INR: 1 (ref 0.00–1.49)
Prothrombin Time: 12.9 seconds (ref 11.6–15.2)

## 2010-07-02 LAB — DIFFERENTIAL
Basophils Absolute: 0 10*3/uL (ref 0.0–0.1)
Basophils Relative: 0 % (ref 0–1)
Eosinophils Absolute: 0.1 10*3/uL (ref 0.0–0.7)
Eosinophils Relative: 1 % (ref 0–5)
Lymphocytes Relative: 12 % (ref 12–46)
Lymphocytes Relative: 13 % (ref 12–46)
Monocytes Absolute: 0.7 10*3/uL (ref 0.1–1.0)
Monocytes Relative: 9 % (ref 3–12)
Neutro Abs: 4.9 10*3/uL (ref 1.7–7.7)
Neutro Abs: 5.6 10*3/uL (ref 1.7–7.7)
Neutrophils Relative %: 75 % (ref 43–77)
Neutrophils Relative %: 77 % (ref 43–77)

## 2010-07-02 LAB — URINALYSIS, ROUTINE W REFLEX MICROSCOPIC
Bilirubin Urine: NEGATIVE
Glucose, UA: NEGATIVE mg/dL
Ketones, ur: NEGATIVE mg/dL
Protein, ur: NEGATIVE mg/dL

## 2010-07-02 LAB — GLUCOSE, RANDOM: Glucose, Bld: 95 mg/dL (ref 70–99)

## 2010-07-02 LAB — LIPID PANEL
LDL Cholesterol: 95 mg/dL (ref 0–99)
Triglycerides: 54 mg/dL (ref <150)
VLDL: 11 mg/dL (ref 0–40)

## 2010-07-02 LAB — CBC
HCT: 38.7 % (ref 36.0–46.0)
HCT: 38.9 % (ref 36.0–46.0)
Hemoglobin: 13.2 g/dL (ref 12.0–15.0)
Hemoglobin: 13.2 g/dL (ref 12.0–15.0)
MCHC: 33.9 g/dL (ref 30.0–36.0)
MCHC: 34.2 g/dL (ref 30.0–36.0)
MCV: 96.9 fL (ref 78.0–100.0)
MCV: 97.5 fL (ref 78.0–100.0)
Platelets: 396 10*3/uL (ref 150–400)
Platelets: 422 10*3/uL — ABNORMAL HIGH (ref 150–400)
RBC: 3.99 MIL/uL (ref 3.87–5.11)
RDW: 13.3 % (ref 11.5–15.5)
RDW: 13.4 % (ref 11.5–15.5)
WBC: 7.3 10*3/uL (ref 4.0–10.5)

## 2010-07-02 LAB — CK TOTAL AND CKMB (NOT AT ARMC)
CK, MB: 0.8 ng/mL (ref 0.3–4.0)
CK, MB: 1 ng/mL (ref 0.3–4.0)
Relative Index: INVALID (ref 0.0–2.5)
Total CK: 29 U/L (ref 7–177)

## 2010-08-10 NOTE — Op Note (Signed)
NAMEBERNADINE, MELECIO             ACCOUNT NO.:  [id number]   MEDICAL RECORD NO.:  [id number]          PATIENT TYPE:  INP   LOCATION:  0005                         FACILITY:  [facility]   PHYSICIAN:  Ollen Gross, M.D.    DATE OF BIRTH:  [birth year]   DATE OF PROCEDURE:  04/30/2007  DATE OF DISCHARGE:                               OPERATIVE REPORT   PREOPERATIVE DIAGNOSIS:  Osteoarthritis, left hip.   POSTOPERATIVE DIAGNOSIS:  Osteoarthritis, left hip.   PROCEDURE:  Left total hip arthroplasty.   SURGEON:  Ollen Gross, M.D.   ASSISTANT:  Alexzandrew L. Perkins, P.A.-C.   ANESTHESIA:  Spinal.   ESTIMATED BLOOD LOSS:  200.   DRAINS:  Hemovac x1.   COMPLICATIONS:  None.   CONDITION:  Stable to recovery.   BRIEF CLINICAL NOTE:  Ms. Axton is an 77 year old female with severe  end-stage arthritis of the left hip with progressively worsening pain  and dysfunction.  She has failed nonoperative management and presents  for total hip arthroplasty.   PROCEDURE IN DETAIL:  After the successful administration of a spinal  anesthetic, the patient is placed in the right lateral decubitus  position with the left side up and held with the hip positioner.  The  left lower extremity is isolated from her perineum with plastic drapes  and prepped and draped in the usual sterile fashion.  A short  posterolateral incision is made with a 10 blade through subcutaneous  tissue to the level of the fascia lata, which is incised in line with  the skin incision.  The sciatic nerve is palpated and protected and the  short external rotators isolated off the femur.  Capsulectomy is  performed and the hip is dislocated.  The center of the femoral head is  marked and a trial prosthesis placed such that the center of the trial  head corresponds to the center of the native femoral head.  Osteotomy  lines are marked on the femoral neck and osteotomy made with an  oscillating saw.  The femur is then removed  anteriorly to gain  acetabular exposure.   Acetabular retractors are placed.  There is a fair amount of synovitis  in the joint.  Labrum osteophytes and synovium are removed.  Reaming  starts at 45 mm, coursing increments of two to 51 mm and a then 52-mm  pinnacle acetabular shell is placed in anatomic position and transfixed  with two domed screws.  A trial 32-mm neutral +4 liner is placed.   The femur is prepared with the canal finder and irrigation.  We then  began broaching at size 1 up to a size 4 for a size 4 cemented stem.  I  placed high-offset neck with a 32 +1 head.  The hip is reduced with  great stability.  There is full extension, full external rotation in 70  degrees flexion, 40 degrees adduction 90 degrees internal rotation, 90  degrees of flexion and 70 degrees of internal rotation.  By placing the  left leg on top of the right, I felt as though leg lengths are equal.  The trials  are then removed and the permanent apex hole eliminator and  permanent 32-mm neutral +4 liner is placed.  A sponge is placed in the  acetabulum and then the cement restricter size 4 is placed into the  appropriate depth of the femoral canal.  The canal is copiously  irrigated with pulsatile lavage with the bottle brush.  Cement is mixed  and once ready for implantation, it is injected into the dried canal and  pressurized.  The size 4 Summit high-offset cemented stem is placed in  about 20 degrees of anteversion.  It is impacted and once the cement is  fully hardened, then the trial 32 +1 head is placed and the hip is  reduced with the same stability parameters.  The permanent 32 +1 head is  then placed and again the hip is reduced with the same stability  parameters.  The wound is copiously irrigated with saline solution and  the short rotators reattached the femur through drill holes.  Fascia  lata is closed over a Hemovac drain with interrupted #1 Vicryl, subcu  closed with #1 and #2-0 Vicryl  and subcuticular running 4-0 Monocryl.  The drain is hooked to suction, incision cleaned and dried and Steri-  Strips and a bulky sterile dressing applied.  She is placed into a knee  immobilizer, awakened and transported to recovery in stable condition.      Ollen Gross, M.D.  Electronically Signed     FA/MEDQ  D:  04/30/2007  T:  04/30/2007  Job:  [id number]

## 2010-08-10 NOTE — H&P (Signed)
[name], [name]             ACCOUNT NO.:  [id number]   MEDICAL RECORD NO.:  [id number]          PATIENT TYPE:  INP   LOCATION:  1610                         [facility]ACILITY:  [facility]   PHYSICIAN:  Ollen Gross, M.D.    DATE OF BIRTH:  May 07, 1924   DATE OF ADMISSION:  04/30/2007  DATE OF DISCHARGE:                              HISTORY & PHYSICAL   Office visit history and physical was performed on April 19, 2007.   CHIEF COMPLAINT:  Left hip pain.   HISTORY OF PRESENT ILLNESS:  The patient is an 77 year old female who  has seen by Dr. Lequita Halt for ongoing hip problems.  She has known end-  stage arthritis for quite some time now; it has been progressive in  nature.  X-rays show severe end-stage arthritis with bone-on-bone and  some bony erosion noted.  It is felt she would benefit undergoing  surgical intervention.  She has been seen preoperatively by Dr. Kriste Basque,  and  felt to be stable for upcoming surgery.   DRUG ALLERGIES:  SULFA (causes flu-like symptoms); HYDROCODONE  (abdominal pain and constipation).   FOOD ALLERGIES:  SHELLFISH (causes welts).   CURRENT MEDICATIONS:  Discontinue Fosamax; also Metamucil.   PAST MEDICAL HISTORY:  1. Cataracts.  2. Hemorrhoids.  3. Diverticulosis.  4. Osteoporosis.  5. Osteoarthritis.   PAST SURGICAL HISTORY:  1. Hysterectomy.  2. Rectocele surgery.  3. Gallbladder surgery.  4. Appendectomy.  5. Cataract surgery.   SOCIAL HISTORY:  Widowed, nonsmoker.  No alcohol.  Three children.  Lives alone.  Wants to look into [facility] or a skilled nursing  facility.   FAMILY HISTORY:  History of stroke with father.  History of pelvic  fracture with mother.   REVIEW OF SYSTEMS:  GENERAL:  No fevers, chills, night sweats.  NEUROLOGIC:  No seizures or paralysis.  RESPIRATORY:  No shortness of  breath, productive cough or hemoptysis.  CARDIOVASCULAR:  No chest pain,  angina or orthopnea.  GI:  No nausea, vomiting or constipation.  GU:   No  dysuria, hematuria or discharge.  MUSCULOSKELETAL:  Left hip.   PHYSICAL EXAMINATION:  VITAL SIGNS:  Pulse 88, respirations 12, blood  pressure 169/89.  GENERAL:  An 77 year old white female; well-nourished, well-developed,  in no acute distress;  petite, small frame, alert and cooperative and  pleasant.  HEENT:  Normocephalic and atraumatic.  Pupils are reactive.  Oropharynx  is clear.  EOMs intact.  NECK:  Supple.  CHEST: Clear.  HEART:  Regular rate and rhythm.  S1 and S2.  ABDOMEN:  Soft, nontender.  Bowel sounds present.  BREASTS/GENITALIA:  Not done.  EXTREMITIES:  Left hip:  Flexion 90, 0 internal rotation, 0 external  rotation and 20 degrees abduction.   IMPRESSION:  Osteoarthritis of the left hip.   PLAN:  The patient admitted to [facility] to undergo a left  total hip replacement arthroplasty.  Surgery will be performed by Dr.  Ollen Gross.  Her medical physician, Dr. Kriste Basque, has seen her  preoperatively and she is felt to be stable for up and coming surgery.  She does want  to look into a skilled nursing facility; would like to  look into [facility] first postoperatively.      Alexzandrew L. Perkins, P.A.C.      Ollen Gross, M.D.  Electronically Signed    ALP/MEDQ  D:  05/01/2007  T:  05/01/2007  Job:  [id number]   cc:   Lonzo Cloud. Kriste Basque, MD  [address]  [place]  [place] [address]

## 2010-08-10 NOTE — Discharge Summary (Signed)
[name], [name]             ACCOUNT NO.:  [id number]   MEDICAL RECORD NO.:  [id number]          PATIENT TYPE:  INP   LOCATION:  1610                         FACILITY:  [facility]   PHYSICIAN:  Ollen Gross, M.D.    DATE OF BIRTH:  1925-02-28   DATE OF ADMISSION:  04/30/2007  DATE OF DISCHARGE:                               DISCHARGE SUMMARY   ADMISSION DIAGNOSES:  1. Osteoarthritis, left hip.  2. Cataracts.  3. Hemorrhoids.  4. Diverticulosis.  5. Osteoporosis.  6. Osteoarthritis.   DISCHARGE DIAGNOSES:  1. Osteoarthritis, left hip, status post left total replacement      arthroplasty.  2. Postoperative blood loss anemia.  3. Status post transfusion without sequelae.  4. Postoperative hyponatremia, improving.  5. Cataracts.  6. Hemorrhoids.  7. Diverticulosis.  8. Osteoporosis.  9. Osteoarthritis.   PROCEDURE:  April 30, 2007, left total hip.  Surgeon Dr. Lequita Halt,  assistant Avel Peace, P.A.-C.  Anesthesia was spinal.   CONSULTS:  None.   BRIEF HISTORY:  [name] is an 77 year old female with severe end-  stage arthritis of the left hip, progressive worsening pain and  dysfunction, who failed nonoperative management, who now presents for a  total hip arthroplasty.   LABORATORY DATA:  CBC on admission:  Hemoglobin 12.6, hematocrit 37.2,  white cell count 5.9, platelets 222.  Chem panel:  Slightly low sodium  on admission 134; remaining Chem panel within normal limits.  PT/INR  13.1 and 1 preoperatively with PTT of 28.  Preoperative UA negative.  Serial CBCs were followed.  Hemoglobin did drop down 8.6 postoperative,  given 2 units of blood; post-transfusion hemoglobin back up to 11.3.  Last noted H&H 10.7 and 30.7.  Serial protimes followed.  Lasted noted  PT/INR 21.9 and 1.9.  Sodium started a little low, 134, dropped down to  128.  It was already improving back up to 129 prior to discharge.   X-RAYS:  Chest x-ray April 26, 2007:  No active  cardiopulmonary  process, mild pulmonary hyperinflation.  Left hip films preoperative:  Progressive advanced left hip osteoarthritis with flattening of the left  femoral head.  Postoperative hip and pelvis films:  Well-seated  components, left total hip.  EKG dated April 26, 2007:  Normal sinus  rhythm, no old tracings, confirmed by Dr. Shawnie Pons.   HOSPITAL COURSE:  The patient admitted to [facility],  tolerated the procedure well, and later returned to the recovery room  and then to the orthopedic floor.  Started on PCA and p.o. analgesic  pain control following surgery.  Had a pretty decent night evening of  surgery, a little bit of pain, encourage p.o. medications.  She wanted  to look into a skilled facility postoperatively, wanted to look into  [facility].  We started the FL-2.  Blood pressure was a little low but  asymptomatic.  Hemoglobin although it dropped, we felt the asymptomatic  hypotension was due to the acute blood loss.  She was given 2 units of  blood due to the low hemoglobin.  Blood came back up, responded well.  Also given fluids.  I&Os were followed very closely.  She had decent  output following surgery.  By day two, she was doing a little bit  better, just slowly progressing, only walking about 3 feet on day 1, but  then got up to about 60 feet on day 2.  Sodium had dropped, felt to be  due to the dilutional component.  We stopped the fluids.  Rechecked her  BMET.  Incision was checked.  Dressing changed.  Incision looked good.  By day 3 on May 03, 2007, she was doing well.  Sodium was slowly  improving back up to 129.  She was asymptomatic with this, progressing  well.  It was noted that a bed was available at [facility] nursing  facility, felt she would be an excellent candidate there for rehab and  was transferred out at that time.   DISCHARGE/PLAN:  1. The patient is being transferred to [facility] nursing facility.  2. Discharge  diagnoses:  Please see above.   DISCHARGE MEDICATIONS:  1. Coumadin protocol.  Please titrate the Coumadin level for target      INR between 2 and 3.  She needs to be on Coumadin for 3 weeks from      date of surgery at April 30, 2007 .  2. Colace 100 mg p.o. b.i.d.  3. Percocet 5 mg 1 or 2 every 4-6 hours as needed for pain.  4. Tylenol 325 one or two every 4-6 hours as needed for mild pain or      headache.  5. Reglan 10 p.o. q.8h. hours p.r.n. nausea.  6. Robaxin 500 mg p.o. q.6-8h. p.r.n. spasm  7. Artificial Tears 1 drop OP p.r.n.   DIET:  As tolerated.   ACTIVITY:  She is partial weightbearing, 25-50% to the left lower  extremity, hip precautions, total hip protocol.  PT, OT for gait  training, ambulation, ADLs and hip precautions, range of motion and  strengthening exercises.  The patient may start showering.  However, do  not submerge the incision under water.   FOLLOW-UP:  She needs to follow up with Dr. Lequita Halt in the office  approximately two weeks from the date of surgery.  Please contact the  office at [phone] to arrange appointment time and followup with this  patient.   DISPOSITION:  [facility].   CONDITION ON DISCHARGE:  Improving.      Alexzandrew L. Perkins, P.A.C.      Ollen Gross, M.D.  Electronically Signed    ALP/MEDQ  D:  05/03/2007  T:  05/03/2007  Job:  [id number]   cc:   Ollen Gross, M.D.  Fax: [phone]   Lonzo Cloud. Kriste Basque, MD  [address]  [place]  [place] [address]   [facility]

## 2010-09-03 ENCOUNTER — Telehealth: Payer: Self-pay | Admitting: Pulmonary Disease

## 2010-09-03 NOTE — Telephone Encounter (Signed)
I spoke with the patient and she states she has been having some nausea, and pressure around her eyes x several days. She also states she also noticed some redness and has been using some OTC eye drops. Pt is requesting an appt. Appt made for Tuesday 09-07-10 at 4pm. Pt aware. She requests to wait to discuss issues with SN at that time rather then send him a phone message. [name], CMA

## 2010-09-07 ENCOUNTER — Ambulatory Visit (INDEPENDENT_AMBULATORY_CARE_PROVIDER_SITE_OTHER): Payer: Medicare Other | Admitting: Pulmonary Disease

## 2010-09-07 DIAGNOSIS — M810 Age-related osteoporosis without current pathological fracture: Secondary | ICD-10-CM

## 2010-09-07 DIAGNOSIS — F411 Generalized anxiety disorder: Secondary | ICD-10-CM

## 2010-09-07 DIAGNOSIS — I10 Essential (primary) hypertension: Secondary | ICD-10-CM

## 2010-09-07 DIAGNOSIS — I6350 Cerebral infarction due to unspecified occlusion or stenosis of unspecified cerebral artery: Secondary | ICD-10-CM

## 2010-09-07 DIAGNOSIS — G319 Degenerative disease of nervous system, unspecified: Secondary | ICD-10-CM

## 2010-09-07 DIAGNOSIS — M1990 Unspecified osteoarthritis, unspecified site: Secondary | ICD-10-CM

## 2010-09-07 DIAGNOSIS — I739 Peripheral vascular disease, unspecified: Secondary | ICD-10-CM

## 2010-09-07 DIAGNOSIS — E049 Nontoxic goiter, unspecified: Secondary | ICD-10-CM

## 2010-09-07 DIAGNOSIS — K5730 Diverticulosis of large intestine without perforation or abscess without bleeding: Secondary | ICD-10-CM

## 2010-09-07 DIAGNOSIS — I059 Rheumatic mitral valve disease, unspecified: Secondary | ICD-10-CM

## 2010-09-07 DIAGNOSIS — E785 Hyperlipidemia, unspecified: Secondary | ICD-10-CM

## 2010-09-07 NOTE — Progress Notes (Signed)
Subjective:    Patient ID: [name], female    DOB: [birth year], 77 y.o.   MRN: [id number]  HPI 77 y/o WF here for a follow up visit... she has multiple medical problems as noted below...  Followed for general medical purposes w/ hx of HBP, mild MVP, ASPVD, Goiter, Divertics, DJD w/ left THR, Osteoporosis, hx right occipital stoke & 3rd N paresis, Memory loss, & mod severe anxiety...  ~  March 30, 2009:  her strabismus has resolved as predicted by DrTMartin, Ophthalmology at [facility] & she is pleased... still rather stressed w/ mult somatic complaints... notes mild edema on the Norvasc10 but doesn't want diuretic added to to excess urination already... she still goes to the Gym regularly... she is on PLAVIX per DrSethi but stopped the ASA per his suggestion "you don't need aspirin if you are on Plavix"...  ~  October 05, 2009:  she c/o some edema in LEs late in the day "it's due to my vein stripping yrs ago" & we reviewed no salt, elevation & support hose...  BP checks have all been good, denies CP/ palpit/ SOB, etc... overall stable> see prob list below...  ~  April 26, 2010:  6 month ROV- stable overall but she stopped the Plavix on her own (due to some minor bruising) & we discussed restarting this medication due to prev stroke & risk of recurrence.    She has neck pain & saw Neurosurg (?we don't have notes) & told it was a bone spur & might need surg; for now heating pad, Tylenol, phys therapy; she exercises at the gym regularly...    BP controlled on Norvasc;  denies CP, palpit, SOB, edema;  FLP is OK on diet alone, & she declines low dose statin med;  she seems to be managing reasonably well...  ~  May 18, 2010:  Add-on for blurred vision & left neck pain w/ incr anxiety evident... states she's worried about her heart w/ these symptoms (known mild MVP, no known CAD) & EKG today is NSR, essent WNL.Marland Kitchen.  she equates the blurred vision to her stroke 8/10 but she had dysconjugate gaze &  3rd nerve paresis at that time & EOM are normal now... all this hx reviewed w/ the pt & excellent Neuro note from [facility] 9/11 also reviewed w/ her including MRI/ MRA (neg- no acute process), CSpine films (signif DDD & anterolisthesis C4 on C5), etc...  she was prev rec to take Zoloft but she declined, & keeps on perseverating her concerns over heart & stroke>  we decided to treat her neck symptoms w/ rest, heat, Tramadol Prn, and rec f/u neuro check by DrSethi etal (time for 41mo rov)... she would like Ophthalmology check for the blurring of vision but doesn't want to see DrShapiro again> we will refer to [facility] ==> (we don't have notes from DrSethi or Ophthalmology).  ~  September 07, 2010:  47mo ROV & she relates a stressful "situation" at the [facility] regarding a hug from another member- very upsetting to her, she's been using the Alprazolam but she feels this caused trouble w/ her thinking & she wants to use PROCERA AVH supplement to help w/ her memory;  she notes "pressure" behind eyes, HAs, unable to relax> doesn't want meds & notes "if I get my nerves straightened out I'll be OK"...   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 eval by Neurology/ stroke team showed tiny right occipital infarct, diffuse intra /extracranial atherosclerotic dis (but no focal stenoses or interventionable lesions) & a part left 3rd nerve palsy as well... PLAVIX was added to her ASA therapy... ~  11/10:  f/u DrSethi stopped ASA, continued Plavix for her cerebrovasc dis... ~  11/10:  saw [facility]TMartin at [facility]- he predicted good prognosis for spont recovery of left eye movement. ~  1/11:  her left 3rd nerve palsy has resolved...  ALLERGIC RHINITIS (ICD-477.8) - uses OTC antihistamines infrequently...  HYPERTENSION, MILD (ICD-401.1) - she has hx of HBP and on NORVASC 10mg /d... BP= 148/84 today and  130's/ 70's at home... denies HA, fatigue, CP, palipit, dizziness, syncope, dyspnea; min edema noted...  Hx of MITRAL VALVE PROLAPSE (ICD-424.0) - baseline EKG w/ NSR, WNL;  Neg stress thallium 4/93... she denies CP, palpit, SOB, etc... she exercises regularly walking 3-4 miles and going to the gym. ~  2DEcho 9/10 showed trivial prolapse of the post leaflet, norm LVF w/ EF= 65-70%, no regional wall motion abnormalities...  PERIPHERAL VASCULAR DISEASE (ICD-443.9) - on PLAVIX 75mg /d... CTAbd 8/06 showed thickening of Ao wall & prob plaque;  CDopplers 7/08 showed 0-39% bilat ICA stenoses & plaque in Rt subclavian art (plus Rt Thyroid Mass). ~  MRI/ MRA Brain & Neck 9/10 showed intra & extracranial atherosclerotic changes w/o interventionable lesions & no hemodynamically signif stenoses in the neck...  VENOUS INSUFFICIENCY (ICD-459.81) - she follows a low sodium diet... no edema at this time...  HYPERCHOLESTEROLEMIA, BORDERLINE (ICD-272.4) - on diet + FISH OIL, she does not want meds. ~  FLP 1/11 showed TChol 199, TG 109, HDL 66, LDL 112 ~  FLP 7/11 showed TChol 212, TG 66, HDL 69, LDL 133 ~  FLP 1/12 showed TChol 208, TG 106, HDL 66, LDL 124  GOITER, UNSPECIFIED (ICD-240.9) - Old CXR's show deviation of trachea to left by thyroid & CDoppler showed right thyroid mass... she has no local symptoms, swallowing difficulty & is clinically euthyroid...  ~  labs 6/09 showed TSH= 1.04 ~  labs 6/10 showed TSH= 0.98 ~  labs 7/11 showed TSH= 1.40  DIVERTICULOSIS OF COLON (ICD-562.10) & HEMORRHOIDS (ICD-455.6) - Hx of very tortuous & redundant colon w/ severe sigmoid diverticulosis;  last colonoscopy w/ pediatric scope 10/98 was difficult & subseq barium enema showed severe divertics otherwise neg;  Rx fiber, anusol, etc...  CTAbd 1/03 w/ divertics otherwise neg;  she is reminded to take the MIRALAX/ Metamucil regularly.  UNSPECIFIED CYSTITIS (ICD-595.9) - Urology eval 8/09 by DrDalstadt...  OSTEOARTHRITIS  (ICD-715.90) - DJD in both hips L>Rt w/ LTHR done 2/09 by DrAlusio... she takes Glucosamine, Calcium, MVI, VitC & VitD. ~  8/11: c/o neck pain & CSpine films by DrSethi showed DDD, 3mm anterolisthesis C4 on C5, osteopenia... ~  2/12:  c/o continued neck discomfort ?eval by neurosurg she said?, advised rest, heat, Tramadol Prn.   OSTEOPOROSIS (ICD-733.00) - BMD by DrNeal 7/02 showed TScores -2.3 to -3.2;  regular f/u BMD from DrNeal - last 8/08 w/ TScores -2.6 to -3.2;  Vit D level checked by GYN in past & was 53 here 6/09... ~  12/09:  she reports that she stopped her Fosamax after 12 yrs Rx per DrNeal & he has rec Reclast but she is undecided & inclined to wait til next BMD and see if there has been any deterioration in measured bone density...  CVA (ICD-434.91) - Hosp 9/10 w/ blurry vision &  exam showing mild field cut & left eye strabismus... eval by Neurology/ stroke team showed tiny right occipital infarct, diffuse intra /extracranial atherosclerotic dis (but no focal stenoses or interventionable lesions) & a part left 3rd nerve palsy as well... PLAVIX was added to her ASA therapy... SEE DC SUMMARY  CEREBRAL ATROPHY (ICD-331.9) - CTBrain 10/07 showed mild atrophy, otherw neg... she denies memory problems- with minor changes on MMSE testing... 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 ~  6/10: when asked about her memory she states "it depends on what day it is" ~  1/11:  I see signs of mild deterioration in memory but she denies- eg. she couldn't recall what DrTMartin told her at [facility] about her 3rd nerve paresis, and she insisted that her mother lived to 59, then later corrected herself (she lived to 52). ~  2/12: similar signs of memory impairment w/ her c/o blurry vision & neck pain> she couldn't recall prev w/u by neuro or results.  ANXIETY (ICD-300.00) - she has severe stress having found her son at home after a suicide... she has ALPRAZOLAM for Prn use but doesn't take  it... she was rec to try Zoloft per Neuro but she never filled it.   Past Surgical History  Procedure Date  . Breast lumpectomy 1960's    benign breast biopsy  . Cholecystectomy 1980    at [facility]  Marland Kitchen Appendectomy 1980    at [facility].  . Abdominal hysterectomy 07/1980    hyst and rectocele repair by Dr. Dewaine Conger  . Oculoplastic eye surgery 12/1997    in [place]  . Vv stripping years ago     Outpatient Encounter Prescriptions as of 09/07/2010  Medication Sig Dispense Refill  . ALPRAZolam (XANAX) 0.25 MG tablet Take 1/2 to 1 tablet by mouth three times daily as needed for nerves       . amLODipine (NORVASC) 10 MG tablet Take 10 mg by mouth daily.        Marland Kitchen Bioflavonoid Products (ESTER C PO) Take 1 tablet by mouth daily.        . calcium gluconate 500 MG tablet Take 500 mg by mouth 2 (two) times daily.        . Cholecalciferol (VITAMIN D) 1000 UNITS capsule Take 1,000 Units by mouth daily.        . clopidogrel (PLAVIX) 75 MG tablet Take 75 mg by mouth daily.        . fish oil-omega-3 fatty acids 1000 MG capsule Take 2 g by mouth daily.        . Glucosamine HCl 1000 MG TABS Take 1 tablet by mouth daily.        . Multiple Vitamin (MULTIVITAMIN PO) Take 1 tablet by mouth daily.        . Psyllium (METAMUCIL) 30.9 % POWD 1 tsp daily       . traMADol (ULTRAM) 50 MG tablet Take 50 mg by mouth every 6 (six) hours as needed. As needed for pain         Allergies  Allergen Reactions  . ZOX:[id number] Acid+Aspartame     REACTION: "fuzzy-headed"  . Sulfamethoxazole W/Trimethoprim     REACTION: allergy to Sulfa w/ flu-like illness.    Review of Systems        See HPI - all other systems neg except as noted...  The patient denies anorexia, fever, weight loss, weight gain, vision loss, decreased hearing, hoarseness, chest pain, syncope, dyspnea on  exertion, peripheral edema, prolonged cough, headaches, hemoptysis, abdominal pain, melena, hematochezia, severe  indigestion/heartburn, hematuria, incontinence, muscle weakness, suspicious skin lesions, transient blindness, difficulty walking, depression, unusual weight change, abnormal bleeding, enlarged lymph nodes, and angioedema.   Objective:   Physical Exam     WD,Thin, 77 y/o WF in NAD... she is moderately anxious today... GENERAL:  Alert & oriented; pleasant & cooperative... HEENT:  [place]/AT, EOM- full & WNL, EACs-clear, TMs-wnl, NOSE-pale w/ clear discharge, THROAT-clear & WNL. NECK:  Supple w/ fairROM; no JVD; normal carotid impulses w/o bruits; palp thyroid w/o change, no lymphadenopathy. CHEST:  Decr BS bilat, clear- no wheezes/ rales/ rhonchi... HEART:  Regular Rhythm; without murmurs/ rubs/ or gallops heard... ABDOMEN:  Soft & nontender; normal bowel sounds; no organomegaly or masses detected. EXT: without deformities, mild arthritic changes; no varicose veins/ +venous insuffic/ no edema. NEURO: CN's intact & no other neuro deficits... some decr ROM neck. SKIN:  neg- w/o lesions...   Assessment & Plan:   OPHTHALMOLOGY>  She was to f/u w/ new eye doctor at [facility], ?if she went, we don't have notes...  HBP>  Borderline BP control on Norvasc but she didn't bring bottles to review compliance & reminded to bring all meds to each & every visit...  Periph Vasc Dis/ Hx of STROKE>  On Plavix daily, notes reviewed...  CHOL>  FLP not at goal but she declines statin rx...  GOITER>  Denies compressive symptoms or hyper/hypo symptoms; chemically euthyroid as well & following...  DIVERTICULOSIS>  Known severe sigm divertics & reminded to take Metamucil/ Miralax regularly...  DJD>  She had Tramadol to use prn but prefers OTC meds- Tylenol/ Advil etc...  Osteoporosis>  She gets BMDs from DrNeal, Gyn & was prev on fosamax; this was stopped after 10+ yrs rx & DrNeal has rec reclast but she is holding off...  MEMORY LOSS>  She has some atrophy on scans & minor changes on prev MMSE; she has  declined Aricept but wants to try PROCERA AVH OTC supplement for memory.  ANXIETY>  As noted she feels that the Alpraz caused trouble thinking but does not want substitute medication for nerves.Marland KitchenMarland Kitchen

## 2010-09-11 ENCOUNTER — Encounter: Payer: Self-pay | Admitting: Pulmonary Disease

## 2010-09-11 NOTE — Patient Instructions (Signed)
Today we updated your med list in EPIC...    Continue your current meds the same...  We reviewed your last lab data & XRay results...  OK to try the PROCERA AVH supplement for your memory...  Call for any questions...  Let's plan a follow up eval in 3-4 months, sooner if needed for problems.Marland KitchenMarland Kitchen

## 2010-10-07 ENCOUNTER — Ambulatory Visit: Payer: Medicare Other | Admitting: Adult Health

## 2010-10-21 IMAGING — CT CT HEAD W/O CM
1 of 2 series · 13 of 30 positions shown, 17 images · non-contrast
Comparison: None.

CLINICAL DATA: Decreased vision left eye.

CT HEAD WITHOUT CONTRAST
TECHNIQUE: Contiguous axial images were obtained from the base of
the skull through the vertex without contrast.

[Series 2: brain · axial · 0.47mm/px · z∈[-96,+39]mm · 13 of 32 slices shown, 17 images]
[im 3/32  brain]
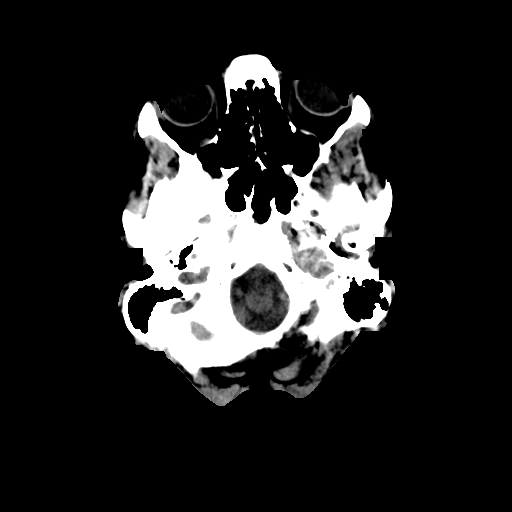
[im 3/32  bone]
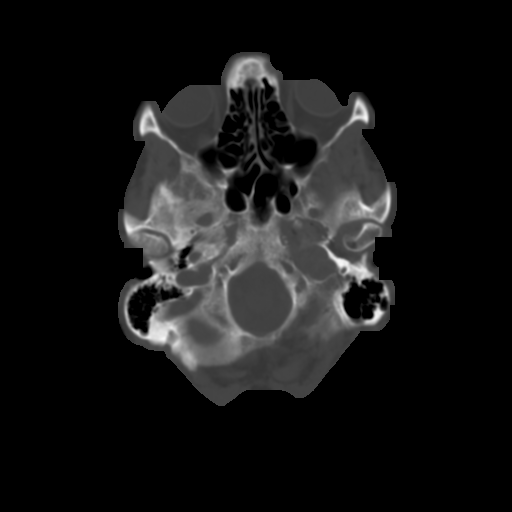
[im 5/32  brain]
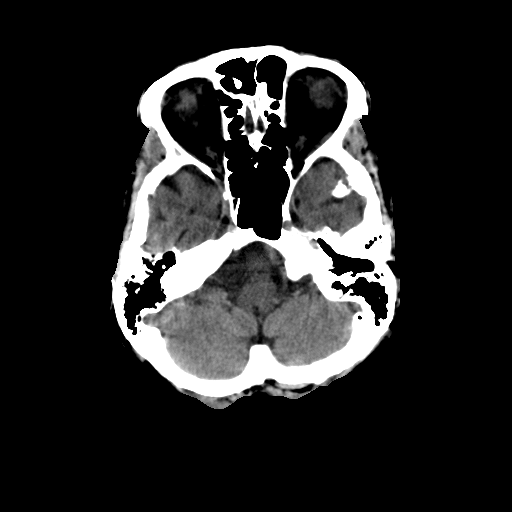
[im 7/32  brain]
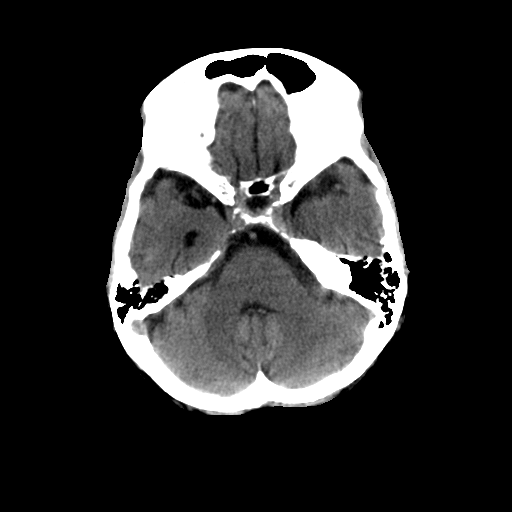
[im 9/32  brain]
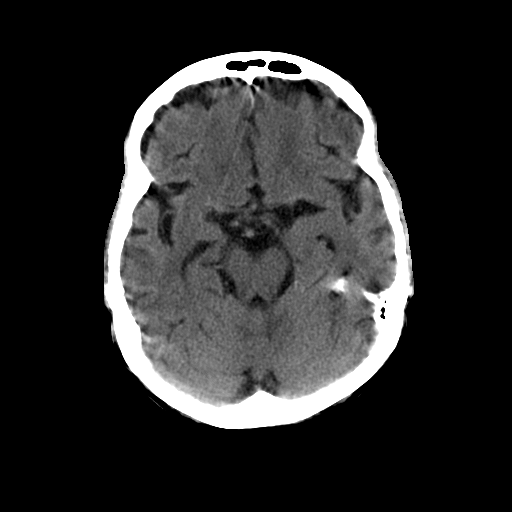
[im 12/32  brain]
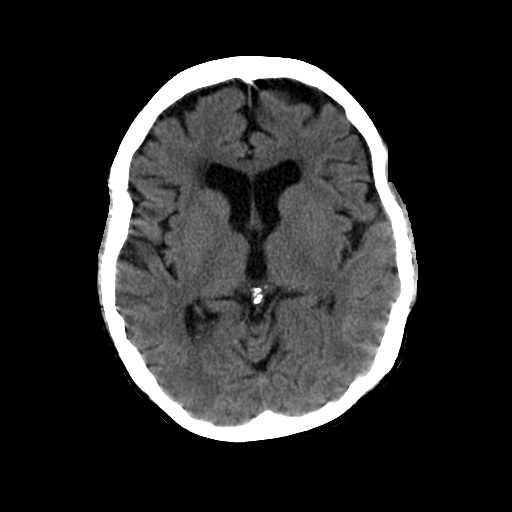
[im 12/32  bone]
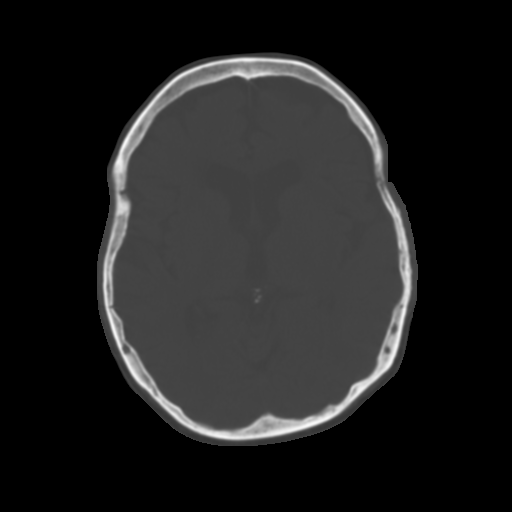
[im 14/32  brain]
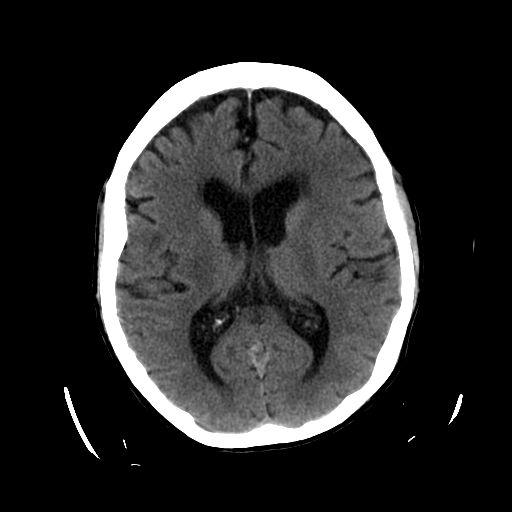
[im 16/32  brain]
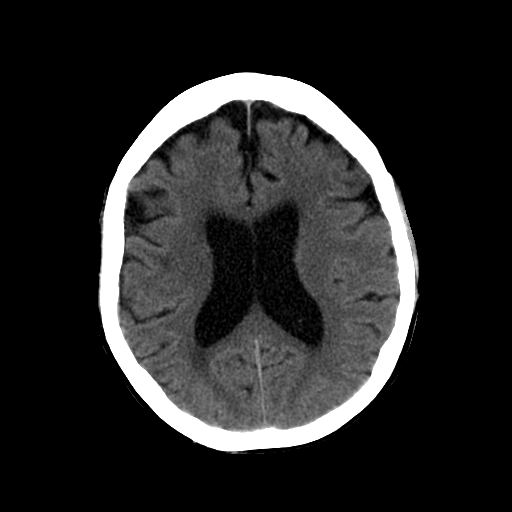
[im 18/32  brain]
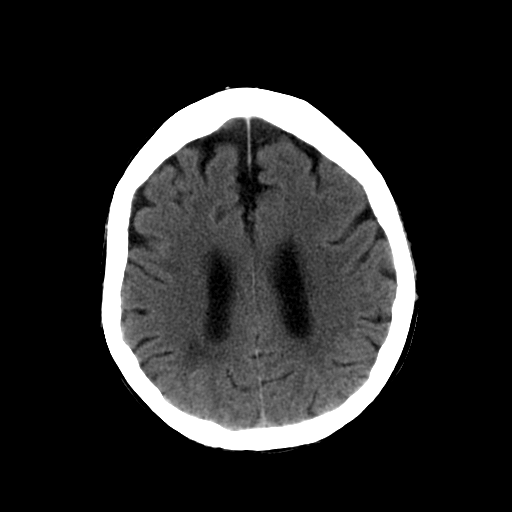
[im 20/32  brain]
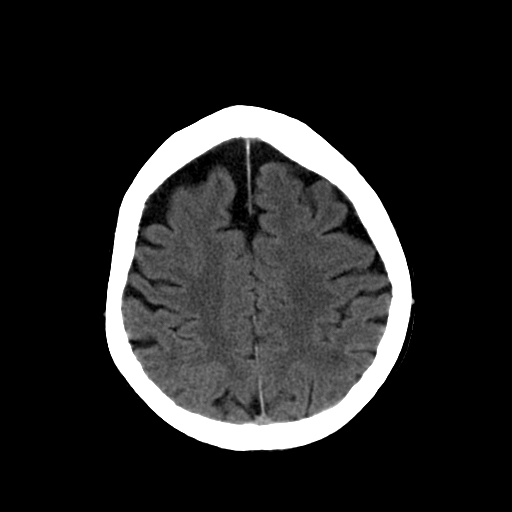
[im 20/32  bone]
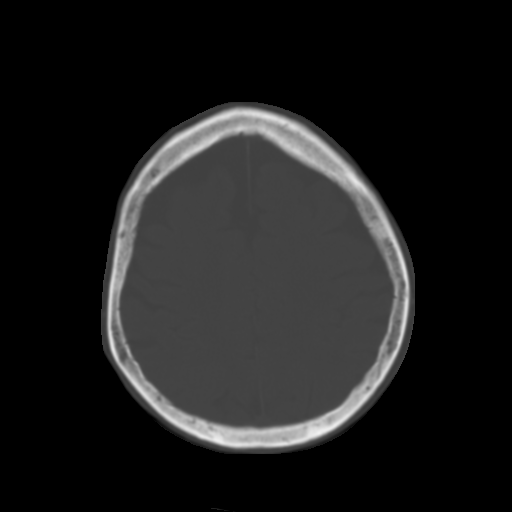
[im 23/32  brain]
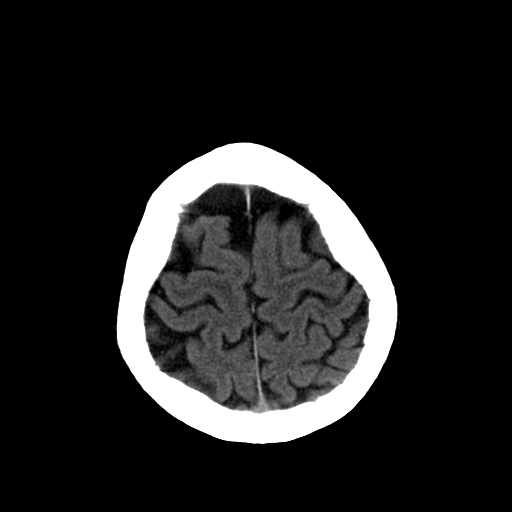
[im 25/32  brain]
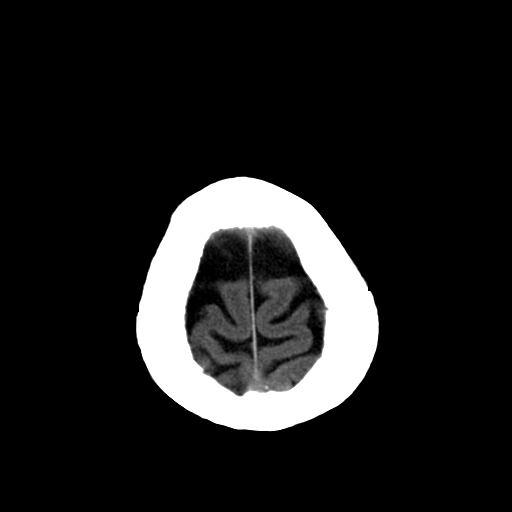
[im 27/32  brain]
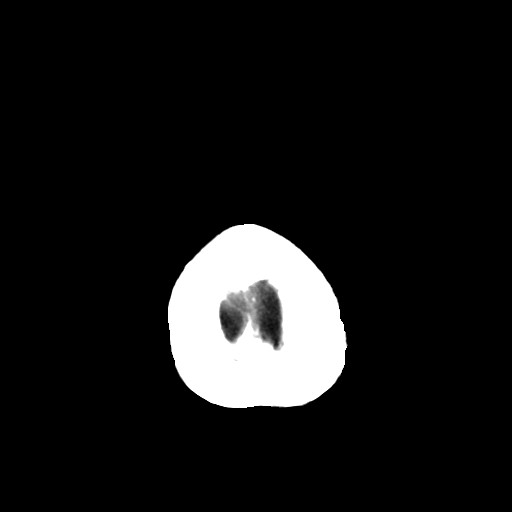
[im 29/32  brain]
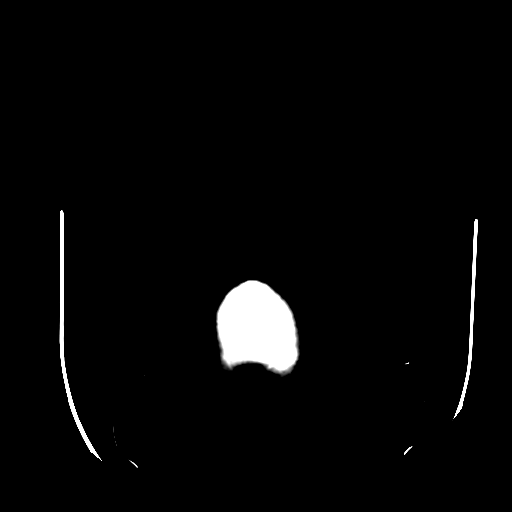
[im 29/32  bone]
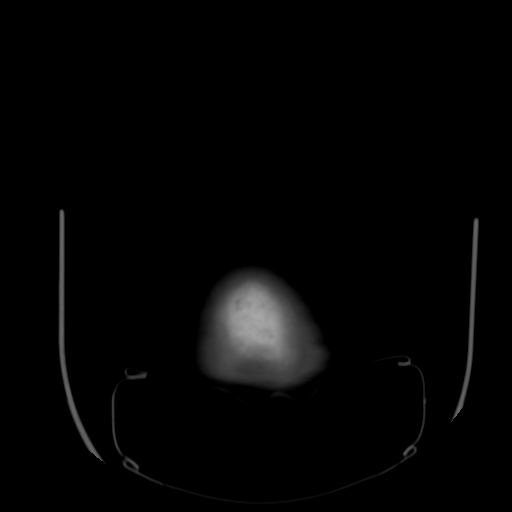

[13 of 30 positions shown; findings below may reference images not displayed]

FINDINGS: There is no evidence for acute hemorrhage, hydrocephalus,
mass lesion, or abnormal extra-axial fluid collection.  No definite
CT evidence for acute infarction.  Diffuse loss of parenchymal
volume is consistent with atrophy. Patchy low attenuation in the
deep hemispheric and periventricular white matter is nonspecific,
but likely reflects chronic microvascular ischemic demyelination.
The visualized paranasal sinuses and mastoid air cells are clear.
IMPRESSION: No acute intracranial abnormality.

Atrophy with chronic small vessel white matter disease.

Areas of acute ischemia may be inapparent on CT examination of the
brain for up to 24-48 hours.

## 2010-10-25 ENCOUNTER — Ambulatory Visit (INDEPENDENT_AMBULATORY_CARE_PROVIDER_SITE_OTHER): Payer: Medicare Other | Admitting: Pulmonary Disease

## 2010-10-25 ENCOUNTER — Other Ambulatory Visit (INDEPENDENT_AMBULATORY_CARE_PROVIDER_SITE_OTHER): Payer: Medicare Other

## 2010-10-25 ENCOUNTER — Encounter: Payer: Self-pay | Admitting: Pulmonary Disease

## 2010-10-25 DIAGNOSIS — I059 Rheumatic mitral valve disease, unspecified: Secondary | ICD-10-CM

## 2010-10-25 DIAGNOSIS — G319 Degenerative disease of nervous system, unspecified: Secondary | ICD-10-CM

## 2010-10-25 DIAGNOSIS — I10 Essential (primary) hypertension: Secondary | ICD-10-CM

## 2010-10-25 DIAGNOSIS — M810 Age-related osteoporosis without current pathological fracture: Secondary | ICD-10-CM

## 2010-10-25 DIAGNOSIS — F411 Generalized anxiety disorder: Secondary | ICD-10-CM

## 2010-10-25 DIAGNOSIS — E785 Hyperlipidemia, unspecified: Secondary | ICD-10-CM

## 2010-10-25 DIAGNOSIS — I6350 Cerebral infarction due to unspecified occlusion or stenosis of unspecified cerebral artery: Secondary | ICD-10-CM

## 2010-10-25 DIAGNOSIS — M1990 Unspecified osteoarthritis, unspecified site: Secondary | ICD-10-CM

## 2010-10-25 DIAGNOSIS — I739 Peripheral vascular disease, unspecified: Secondary | ICD-10-CM

## 2010-10-25 DIAGNOSIS — K5730 Diverticulosis of large intestine without perforation or abscess without bleeding: Secondary | ICD-10-CM

## 2010-10-25 DIAGNOSIS — I872 Venous insufficiency (chronic) (peripheral): Secondary | ICD-10-CM

## 2010-10-25 LAB — CBC WITH DIFFERENTIAL/PLATELET
Basophils Relative: 0.5 % (ref 0.0–3.0)
Eosinophils Relative: 3.8 % (ref 0.0–5.0)
HCT: 41.7 % (ref 36.0–46.0)
Hemoglobin: 14 g/dL (ref 12.0–15.0)
Lymphs Abs: 0.8 10*3/uL (ref 0.7–4.0)
MCV: 95.9 fl (ref 78.0–100.0)
Monocytes Relative: 7.7 % (ref 3.0–12.0)
Neutro Abs: 3.9 10*3/uL (ref 1.4–7.7)
RBC: 4.35 Mil/uL (ref 3.87–5.11)
WBC: 5.4 10*3/uL (ref 4.5–10.5)

## 2010-10-25 LAB — BASIC METABOLIC PANEL
BUN: 16 mg/dL (ref 6–23)
Calcium: 9.5 mg/dL (ref 8.4–10.5)
GFR: 58.77 mL/min — ABNORMAL LOW (ref >60.00)
Glucose, Bld: 90 mg/dL (ref 70–99)
Sodium: 139 mEq/L (ref 135–145)

## 2010-10-25 LAB — LIPID PANEL
Cholesterol: 198 mg/dL (ref 0–200)
HDL: 74.5 mg/dL (ref >39.00)
LDL Cholesterol: 114 mg/dL — ABNORMAL HIGH (ref 0–99)
VLDL: 9.4 mg/dL (ref 0.0–40.0)

## 2010-10-25 LAB — VITAMIN D 25 HYDROXY (VIT D DEFICIENCY, FRACTURES): Vit D, 25-Hydroxy: 56 ng/mL (ref 30–89)

## 2010-10-25 LAB — HEPATIC FUNCTION PANEL
Albumin: 4.3 g/dL (ref 3.5–5.2)
Total Bilirubin: 0.9 mg/dL (ref 0.3–1.2)

## 2010-10-25 NOTE — Progress Notes (Signed)
Subjective:    Patient ID: [name], female    DOB: January 14, 1925, 77 y.o.   MRN: [id number]  HPI 77 y/o WF here for a follow up visit... she has multiple medical problems as noted below...  Followed for general medical purposes w/ hx of HBP, mild MVP, ASPVD, Goiter, Divertics, DJD w/ left THR, Osteoporosis, hx right occipital stoke & 3rd N paresis, Memory loss, & mod severe anxiety...  ~  October 05, 2009:  she c/o some edema in LEs late in the day "it's due to my vein stripping yrs ago" & we reviewed no salt, elevation & support hose...  BP checks have all been good, denies CP/ palpit/ SOB, etc... overall stable> see prob list below...  ~  April 26, 2010:  6 month ROV- stable overall but she stopped the Plavix on her own (due to some minor bruising) & we discussed restarting this medication due to prev stroke & risk of recurrence.    She has neck pain & saw Neurosurg (?we don't have notes) & told it was a bone spur & might need surg; for now heating pad, Tylenol, phys therapy; she exercises at the gym regularly...    BP controlled on Norvasc;  denies CP, palpit, SOB, edema;  FLP is OK on diet alone, & she declines low dose statin med;  she seems to be managing reasonably well...  ~  May 18, 2010:  Add-on for blurred vision & left neck pain w/ incr anxiety evident... states she's worried about her heart w/ these symptoms (known mild MVP, no known CAD) & EKG today is NSR, essent WNL.Marland Kitchen.  she equates the blurred vision to her stroke 8/10 but she had dysconjugate gaze & 3rd nerve paresis at that time & EOM are normal now... all this hx reviewed w/ the pt & excellent Neuro note from [facility] 9/11 also reviewed w/ her including MRI/ MRA (neg- no acute process), CSpine films (signif DDD & anterolisthesis C4 on C5), etc...  she was prev rec to take Zoloft but she declined, & keeps on perseverating her concerns over heart & stroke>  we decided to treat her neck symptoms w/ rest, heat,  Tramadol Prn, and rec f/u neuro check by DrSethi etal (time for 91mo rov)... she would like Ophthalmology check for the blurring of vision but doesn't want to see DrShapiro again> we will refer to [facility] ==> (we don't have notes from DrSethi or Ophthalmology).  ~  September 07, 2010:  72mo ROV & she relates a stressful "situation" at the [facility] regarding a hug from another member- very upsetting to her, she's been using the Alprazolam but she feels this caused trouble w/ her thinking & she wants to use PROCERA AVH supplement to help w/ her memory;  she notes "pressure" behind eyes, HAs, unable to relax> doesn't want meds & notes "if I get my nerves straightened out I'll be OK"...  ~  October 25, 2010:  6wk ROV & due for fasting blood work today... She never tried the [facility] but feels better w/ decr stress; BP controlled on Amlodipine which she remembers to take 4-5d per week she says; she is better about taking the Plavix (daily) & denies cerebral ischemic symptoms; she notes some left arm aching (works out every day) but states it resolves after 2 cups coffee... Requesting rx for shingles vaccine.   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 eval by Neurology/ stroke team showed tiny right occipital infarct, diffuse intra /extracranial atherosclerotic dis (but no focal stenoses or interventionable lesions) & a part left 3rd nerve palsy as well... PLAVIX was added to her ASA therapy... ~  11/10:  f/u DrSethi stopped ASA, continued Plavix for her cerebrovasc dis... ~  11/10:  [facility]- he predicted good prognosis for spont recovery of left eye movement. ~  1/11:  her left 3rd nerve palsy has resolved...  ALLERGIC RHINITIS (ICD-477.8) - uses OTC antihistamines infrequently...  HYPERTENSION, MILD (ICD-401.1) - she has hx of HBP and on NORVASC 10mg /d... BP= 148/84  today and 130's/ 70's at home... denies HA, fatigue, CP, palipit, dizziness, syncope, dyspnea; min edema noted...  Hx of MITRAL VALVE PROLAPSE (ICD-424.0) - baseline EKG w/ NSR, WNL;  Neg stress thallium 4/93... she denies CP, palpit, SOB, etc... she exercises regularly walking 3-4 miles and going to the gym. ~  2DEcho 9/10 showed trivial prolapse of the post leaflet, norm LVF w/ EF= 65-70%, no regional wall motion abnormalities...  PERIPHERAL VASCULAR DISEASE (ICD-443.9) - on PLAVIX 75mg /d... CTAbd 8/06 showed thickening of Ao wall & prob plaque;  CDopplers 7/08 showed 0-39% bilat ICA stenoses & plaque in Rt subclavian art (plus Rt Thyroid Mass). ~  MRI/ MRA Brain & Neck 9/10 showed intra & extracranial atherosclerotic changes w/o interventionable lesions & no hemodynamically signif stenoses in the neck...  VENOUS INSUFFICIENCY (ICD-459.81) - she follows a low sodium diet... no edema at this time...  HYPERCHOLESTEROLEMIA, BORDERLINE (ICD-272.4) - on diet + FISH OIL, she does not want meds. ~  FLP 1/11 showed TChol 199, TG 109, HDL 66, LDL 112 ~  FLP 7/11 showed TChol 212, TG 66, HDL 69, LDL 133 ~  FLP 1/12 showed TChol 208, TG 106, HDL 66, LDL 124 ~  FLP 7/12 showed TChol 198, TG 47, HDL 75, LDL 114  GOITER, UNSPECIFIED (ICD-240.9) - Old CXR's show deviation of trachea to left by thyroid & CDoppler showed right thyroid mass... she has no local symptoms, swallowing difficulty & is clinically euthyroid...  ~  labs 6/09 showed TSH= 1.04 ~  labs 6/10 showed TSH= 0.98 ~  labs 7/11 showed TSH= 1.40 ~  labs 7/12 showed TSH= 1.30  DIVERTICULOSIS OF COLON (ICD-562.10) & HEMORRHOIDS (ICD-455.6) - Hx of very tortuous & redundant colon w/ severe sigmoid diverticulosis;  last colonoscopy w/ pediatric scope 10/98 was difficult & subseq barium enema showed severe divertics otherwise neg;  Rx fiber, anusol, etc...  CTAbd 1/03 w/ divertics otherwise neg;  she is reminded to take the MIRALAX/ Metamucil  regularly.  UNSPECIFIED CYSTITIS (ICD-595.9) - Urology eval 8/09 by DrDalstadt...  OSTEOARTHRITIS (ICD-715.90) - DJD in both hips L>Rt w/ LTHR done 2/09 by DrAlusio... she takes Glucosamine, Calcium, MVI, VitC & VitD. ~  8/11: c/o neck pain & CSpine films by DrSethi showed DDD, 3mm anterolisthesis C4 on C5, osteopenia... ~  2/12:  c/o continued neck discomfort ?eval by neurosurg she said?, advised rest, heat, Tramadol Prn.   OSTEOPOROSIS (ICD-733.00) - BMD by DrNeal 7/02 showed TScores -2.3 to -3.2;  regular f/u BMD from DrNeal - last 8/08 w/ TScores -2.6 to -3.2;  Vit D level checked by GYN in past & was 53 here 6/09... ~  12/09:  she reports that she stopped her Fosamax after 12 yrs Rx per DrNeal & he has rec Reclast but she is undecided & inclined to wait til next  BMD and see if there has been any deterioration in measured bone density...  CVA (ICD-434.91) - Hosp 9/10 w/ blurry vision & exam showing mild field cut & left eye strabismus... eval by Neurology/ stroke team showed tiny right occipital infarct, diffuse intra /extracranial atherosclerotic dis (but no focal stenoses or interventionable lesions) & a part left 3rd nerve palsy as well... PLAVIX was added to her ASA therapy... SEE DC SUMMARY  CEREBRAL ATROPHY (ICD-331.9) - CTBrain 10/07 showed mild atrophy, otherw neg... she denies memory problems- with minor changes on MMSE testing... 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 ~  6/10: when asked about her memory she states "it depends on what day it is" ~  1/11:  I see signs of mild deterioration in memory but she denies- eg. she couldn't recall what DrTMartin told her at [facility] about her 3rd nerve paresis, and she insisted that her mother lived to 88, then later corrected herself (she lived to 62). ~  2/12: similar signs of memory impairment w/ her c/o blurry vision & neck pain> she couldn't recall prev w/u by neuro or results.  ANXIETY (ICD-300.00) - she has severe  stress having found her son at home after a suicide... she has ALPRAZOLAM for Prn use but doesn't take it... she was rec to try Zoloft per Neuro but she never filled it.   Past Surgical History  Procedure Date  . Breast lumpectomy 1960's    benign breast biopsy  . Cholecystectomy 1980    at [facility]  Marland Kitchen Appendectomy 1980    at [facility]  . Abdominal hysterectomy 07/1980    hyst and rectocele repair by Dr. Dewaine Conger  . Oculoplastic eye surgery 12/1997    in [place]  . Vv stripping years ago     Outpatient Encounter Prescriptions as of 10/25/2010  Medication Sig Dispense Refill  . ALPRAZolam (XANAX) 0.25 MG tablet Take 1/2 to 1 tablet by mouth three times daily as needed for nerves       . amLODipine (NORVASC) 10 MG tablet Take 10 mg by mouth daily.        Marland Kitchen Bioflavonoid Products (ESTER C PO) Take 1 tablet by mouth daily.        . calcium gluconate 500 MG tablet Take 500 mg by mouth 2 (two) times daily.        . Cholecalciferol (VITAMIN D) 1000 UNITS capsule Take 1,000 Units by mouth daily.        . clopidogrel (PLAVIX) 75 MG tablet Take 75 mg by mouth daily.        . fish oil-omega-3 fatty acids 1000 MG capsule Take 2 g by mouth daily.        . Glucosamine HCl 1000 MG TABS Take 1 tablet by mouth daily.        . Multiple Vitamin (MULTIVITAMIN PO) Take 1 tablet by mouth daily.        . Psyllium (METAMUCIL) 30.9 % POWD 1 tsp daily       . traMADol (ULTRAM) 50 MG tablet Take 50 mg by mouth every 6 (six) hours as needed. As needed for pain         Allergies  Allergen Reactions  . WUJ:[id number] Acid+Aspartame     REACTION: "fuzzy-headed"  . Sulfamethoxazole W/Trimethoprim     REACTION: allergy to Sulfa w/ flu-like illness.    Review of Systems        See HPI - all other systems neg  except as noted... The patient denies anorexia, fever, weight loss, weight gain, vision loss, decreased hearing, hoarseness, chest pain, syncope, dyspnea on exertion, peripheral  edema, prolonged cough, headaches, hemoptysis, abdominal pain, melena, hematochezia, severe indigestion/heartburn, hematuria, incontinence, muscle weakness, suspicious skin lesions, transient blindness, difficulty walking, depression, unusual weight change, abnormal bleeding, enlarged lymph nodes, and angioedema.    Objective:   Physical Exam     WD,Thin, 77 y/o WF in NAD... she is moderately anxious today... GENERAL:  Alert & oriented; pleasant & cooperative... HEENT:  [place]/AT, EOM- full & WNL, EACs-clear, TMs-wnl, NOSE-pale w/ clear discharge, THROAT-clear & WNL. NECK:  Supple w/ fairROM; no JVD; normal carotid impulses w/o bruits; palp thyroid w/o change, no lymphadenopathy. CHEST:  Decr BS bilat, clear- no wheezes/ rales/ rhonchi... HEART:  Regular Rhythm; without murmurs/ rubs/ or gallops heard... ABDOMEN:  Soft & nontender; normal bowel sounds; no organomegaly or masses detected. EXT: without deformities, mild arthritic changes; no varicose veins/ +venous insuffic/ no edema. NEURO: CN's intact & no other neuro deficits... some decr ROM neck. SKIN:  neg- w/o lesions...   Assessment & Plan:   OPHTHALMOLOGY>  She was to f/u w/ new eye doctor at [facility], ?if she went, we don't have notes...  HBP>  Borderline BP control on Norvasc but she didn't bring bottles to review compliance & reminded to bring all meds to each & every visit...  Periph Vasc Dis/ Hx of STROKE>  On Plavix daily, notes reviewed...  CHOL>  FLP is improved on diet alone, keep up the good work...  GOITER>  Denies compressive symptoms or hyper/hypo symptoms; chemically euthyroid as well & following...  DIVERTICULOSIS>  Known severe sigm divertics & reminded to take Metamucil/ Miralax regularly...  DJD>  She had Tramadol to use prn but prefers OTC meds- Tylenol/ Advil etc...  Osteoporosis>  She gets BMDs from DrNeal, Gyn & was prev on Fosamax; this was stopped after 10+ yrs rx & DrNeal has rec reclast but  she is holding off...  MEMORY LOSS>  She has some atrophy on scans & minor changes on prev MMSE; she has declined Aricept but wants to try PROCERA AVH OTC supplement for memory.  ANXIETY>  As noted she feels that the Alpraz caused trouble thinking but does not want substitute medication for nerves.Marland KitchenMarland Kitchen

## 2010-10-25 NOTE — Patient Instructions (Signed)
Today we updated your med list in EPIC...   Continue your current meds the same...  Today we did your follow up fasting blood work...    Please call the PHONE TREE in a few days for your results...    Dial [id number] & when prompted enter your patient number followed by the # symbol...    Your patient number is:  [id number]#  Keep up the good work w/ your exercise program...  Call for any questions...  Let's plan a follow up visit in 4 months or so.Marland KitchenMarland Kitchen

## 2010-11-07 ENCOUNTER — Encounter: Payer: Self-pay | Admitting: Pulmonary Disease

## 2010-12-16 LAB — COMPREHENSIVE METABOLIC PANEL
AST: 28
Albumin: 3.5
Alkaline Phosphatase: 72
CO2: 29
Chloride: 98
GFR calc Af Amer: >60
GFR calc non Af Amer: >60
Potassium: 4.6
Total Bilirubin: 0.8

## 2010-12-16 LAB — APTT: aPTT: 28

## 2010-12-16 LAB — CBC
HCT: 37.2
MCV: 94.2
Platelets: 222
RBC: 3.95
WBC: 5.9

## 2010-12-16 LAB — URINALYSIS, ROUTINE W REFLEX MICROSCOPIC
Nitrite: NEGATIVE
Specific Gravity, Urine: 1.004 — ABNORMAL LOW
pH: 7

## 2010-12-17 LAB — TYPE AND SCREEN
ABO/RH(D): O POS
Antibody Screen: NEGATIVE

## 2010-12-17 LAB — CBC
Hemoglobin: 11.3 — ABNORMAL LOW
MCHC: 35.1
MCV: 92.7
Platelets: 148 — ABNORMAL LOW
Platelets: 158
Platelets: 162
RBC: 3.33 — ABNORMAL LOW
RDW: 13
RDW: 13.9
WBC: 5.6
WBC: 6.3

## 2010-12-17 LAB — BASIC METABOLIC PANEL
BUN: 12
BUN: 8
Calcium: 8.4
Calcium: 8.6
Chloride: 102
Chloride: 96
Creatinine, Ser: 0.73
Creatinine, Ser: 0.79
Creatinine, Ser: 0.79
GFR calc Af Amer: >60
GFR calc non Af Amer: >60
GFR calc non Af Amer: >60
GFR calc non Af Amer: >60
Glucose, Bld: 113 — ABNORMAL HIGH
Glucose, Bld: 133 — ABNORMAL HIGH
Sodium: 128 — ABNORMAL LOW

## 2010-12-17 LAB — PROTIME-INR
INR: 1.4
INR: 1.9 — ABNORMAL HIGH
Prothrombin Time: 17 — ABNORMAL HIGH
Prothrombin Time: 17.6 — ABNORMAL HIGH
Prothrombin Time: 21.9 — ABNORMAL HIGH

## 2010-12-17 LAB — ABO/RH: ABO/RH(D): O POS

## 2011-01-05 ENCOUNTER — Ambulatory Visit (INDEPENDENT_AMBULATORY_CARE_PROVIDER_SITE_OTHER): Payer: Medicare Other

## 2011-01-05 DIAGNOSIS — Z23 Encounter for immunization: Secondary | ICD-10-CM

## 2011-02-28 ENCOUNTER — Ambulatory Visit (INDEPENDENT_AMBULATORY_CARE_PROVIDER_SITE_OTHER): Payer: Medicare Other | Admitting: Pulmonary Disease

## 2011-02-28 ENCOUNTER — Encounter: Payer: Self-pay | Admitting: Pulmonary Disease

## 2011-02-28 ENCOUNTER — Other Ambulatory Visit: Payer: Self-pay | Admitting: Pulmonary Disease

## 2011-02-28 VITALS — BP 128/72 | HR 73 | Temp 97.8°F | Ht 61.0 in | Wt 107.6 lb

## 2011-02-28 DIAGNOSIS — E049 Nontoxic goiter, unspecified: Secondary | ICD-10-CM

## 2011-02-28 DIAGNOSIS — I10 Essential (primary) hypertension: Secondary | ICD-10-CM

## 2011-02-28 DIAGNOSIS — E785 Hyperlipidemia, unspecified: Secondary | ICD-10-CM

## 2011-02-28 DIAGNOSIS — R141 Gas pain: Secondary | ICD-10-CM

## 2011-02-28 DIAGNOSIS — F411 Generalized anxiety disorder: Secondary | ICD-10-CM

## 2011-02-28 DIAGNOSIS — K5730 Diverticulosis of large intestine without perforation or abscess without bleeding: Secondary | ICD-10-CM

## 2011-02-28 DIAGNOSIS — I059 Rheumatic mitral valve disease, unspecified: Secondary | ICD-10-CM

## 2011-02-28 DIAGNOSIS — M810 Age-related osteoporosis without current pathological fracture: Secondary | ICD-10-CM

## 2011-02-28 DIAGNOSIS — M1990 Unspecified osteoarthritis, unspecified site: Secondary | ICD-10-CM

## 2011-02-28 DIAGNOSIS — I739 Peripheral vascular disease, unspecified: Secondary | ICD-10-CM

## 2011-02-28 DIAGNOSIS — I6350 Cerebral infarction due to unspecified occlusion or stenosis of unspecified cerebral artery: Secondary | ICD-10-CM

## 2011-02-28 DIAGNOSIS — J3089 Other allergic rhinitis: Secondary | ICD-10-CM

## 2011-02-28 MED ORDER — AMLODIPINE BESYLATE 10 MG PO TABS: 10.0000 mg | ORAL_TABLET | Freq: Every day | ORAL | Status: DC

## 2011-02-28 MED ORDER — CLOPIDOGREL BISULFATE 75 MG PO TABS: 75.0000 mg | ORAL_TABLET | Freq: Every day | ORAL | Status: DC

## 2011-02-28 NOTE — Patient Instructions (Signed)
Today we updated your med list in our EPIC system...    Continue your current medications the same...  For your GAS:  Use anything w/ SIMETHACONE to help the gas...    Try GAS-X or MYLICON for upper gas & take it up to 4 times daily as needed...    Try PHAZYME for lower gas & take it up to 4 times daily as needed...  Call for any questions...  Let's plan a follow up visit w/ FASTING blood work in about 6 month's time.Marland KitchenMarland Kitchen

## 2011-02-28 NOTE — Progress Notes (Signed)
Subjective:    Patient ID: [name], female    DOB: July 15, 1924, 77 y.o.   MRN: [id number]  HPI 77 y/o WF here for a follow up visit... she has multiple medical problems as noted below...  Followed for general medical purposes w/ hx of HBP, mild MVP, ASPVD, Goiter, Divertics, DJD w/ left THR, Osteoporosis, hx right occipital stoke & 3rd N paresis, Memory loss, & mod severe anxiety...  ~  October 05, 2009:  she c/o some edema in LEs late in the day "it's due to my vein stripping yrs ago" & we reviewed no salt, elevation & support hose...  BP checks have all been good, denies CP/ palpit/ SOB, etc... overall stable> see prob list below...  ~  April 26, 2010:  6 month ROV- stable overall but she stopped the Plavix on her own (due to some minor bruising) & we discussed restarting this medication due to prev stroke & risk of recurrence.    She has neck pain & saw Neurosurg (?we don't have notes) & told it was a bone spur & might need surg; for now heating pad, Tylenol, phys therapy; she exercises at the gym regularly...    BP controlled on Norvasc;  denies CP, palpit, SOB, edema;  FLP is OK on diet alone, & she declines low dose statin med;  she seems to be managing reasonably well...  ~  May 18, 2010:  Add-on for blurred vision & left neck pain w/ incr anxiety evident... states she's worried about her heart w/ these symptoms (known mild MVP, no known CAD) & EKG today is NSR, essent WNL.Marland Kitchen.  she equates the blurred vision to her stroke 8/10 but she had dysconjugate gaze & 3rd nerve paresis at that time & EOM are normal now... all this hx reviewed w/ the pt & excellent Neuro note from [facility] 9/11 also reviewed w/ her including MRI/ MRA (neg- no acute process), CSpine films (signif DDD & anterolisthesis C4 on C5), etc...  she was prev rec to take Zoloft but she declined, & keeps on perseverating her concerns over heart & stroke>  we decided to treat her neck symptoms w/ rest, heat,  Tramadol Prn, and rec f/u neuro check by DrSethi etal (time for 25mo rov)... she would like Ophthalmology check for the blurring of vision but doesn't want to see DrShapiro again> we will refer to [facility] ==> (we don't have notes from DrSethi or Ophthalmology).  ~  September 07, 2010:  46mo ROV & she relates a stressful "situation" at the [facility] regarding a hug from another member- very upsetting to her, she's been using the Alprazolam but she feels this caused trouble w/ her thinking & she wants to use PROCERA AVH supplement to help w/ her memory;  she notes "pressure" behind eyes, HAs, unable to relax> doesn't want meds & notes "if I get my nerves straightened out I'll be OK"...  ~  October 25, 2010:  6wk ROV & due for fasting blood work today... She never tried the [facility] but feels better w/ decr stress; BP controlled on Amlodipine which she remembers to take 4-5d per week she says; she is better about taking the Plavix (daily) & denies cerebral ischemic symptoms; she notes some left arm aching (works out every day) but states it resolves after 2 cups coffee... Requesting rx for shingles vaccine.  ~  February 28, 2011:  46mo ROV & she is doing well overall just c/o some gas & we discussed Simethacone preps  for this- Mylicon, GasX, Phazyme, etc...  BP well controlled on Amlodip & she denies CP, palpit, SOB, edema; she continues to exercise regularly at the gym; she remains on Plavix & denies cerebral ischemic symptoms; we reviewed labs from 7/12 and FLP ok on her diet + FishOil and Thyroid remains wnl; hx DJD w/ prev left THR & she uses OTC analgesics as needed; she is still not using the Alpraz anxiolytic...  She had the Shingles vaccine & the Flu shot this yr; Plavix & Amlodip refilled per request...          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 eval by Neurology/ stroke  team showed tiny right occipital infarct, diffuse intra /extracranial atherosclerotic dis (but no focal stenoses or interventionable lesions) & a part left 3rd nerve palsy as well... PLAVIX was added to her ASA therapy... ~  11/10:  f/u DrSethi stopped ASA, continued Plavix for her cerebrovasc dis... ~  11/10:  saw [facility]TMartin at [facility]- he predicted good prognosis for spont recovery of left eye movement. ~  1/11:  her left 3rd nerve palsy has resolved...  ALLERGIC RHINITIS (ICD-477.8) - uses OTC antihistamines infrequently...  HYPERTENSION, MILD (ICD-401.1) - she has hx of HBP and on NORVASC 10mg /d... BP= 128/72 today and 130's/ 70's at home... denies HA, fatigue, CP, palipit, dizziness, syncope, dyspnea; min edema noted...  Hx of MITRAL VALVE PROLAPSE (ICD-424.0) - baseline EKG w/ NSR, WNL;  Neg stress thallium 4/93... she denies CP, palpit, SOB, etc... she exercises regularly walking 3-4 miles and going to the gym. ~  2DEcho 9/10 showed trivial prolapse of the post leaflet, norm LVF w/ EF= 65-70%, no regional wall motion abnormalities...  PERIPHERAL VASCULAR DISEASE (ICD-443.9) - on PLAVIX 75mg /d... CTAbd 8/06 showed thickening of Ao wall & prob plaque;  CDopplers 7/08 showed 0-39% bilat ICA stenoses & plaque in Rt subclavian art (plus Rt Thyroid Mass). ~  MRI/ MRA Brain & Neck 9/10 showed intra & extracranial atherosclerotic changes w/o interventionable lesions & no hemodynamically signif stenoses in the neck...  VENOUS INSUFFICIENCY (ICD-459.81) - she follows a low sodium diet... no edema at this time...  HYPERCHOLESTEROLEMIA, BORDERLINE (ICD-272.4) - on diet + FISH OIL, she does not want meds. ~  FLP 1/11 showed TChol 199, TG 109, HDL 66, LDL 112 ~  FLP 7/11 showed TChol 212, TG 66, HDL 69, LDL 133 ~  FLP 1/12 showed TChol 208, TG 106, HDL 66, LDL 124 ~  FLP 7/12 showed TChol 198, TG 47, HDL 75, LDL 114  GOITER, UNSPECIFIED (ICD-240.9) - Old CXR's show deviation of trachea to left by thyroid  & CDoppler showed right thyroid mass... she has no local symptoms, swallowing difficulty & is clinically euthyroid...  ~  labs 6/09 showed TSH= 1.04 ~  labs 6/10 showed TSH= 0.98 ~  labs 7/11 showed TSH= 1.40 ~  labs 7/12 showed TSH= 1.30  DIVERTICULOSIS OF COLON (ICD-562.10) & HEMORRHOIDS (ICD-455.6) - Hx of very tortuous & redundant colon w/ severe sigmoid diverticulosis;  last colonoscopy w/ pediatric scope 10/98 was difficult & subseq barium enema showed severe divertics otherwise neg;  Rx fiber, anusol, etc...  CTAbd 1/03 w/ divertics otherwise neg;  she is reminded to take the MIRALAX/ Metamucil regularly.  UNSPECIFIED CYSTITIS (ICD-595.9) - Urology eval 8/09 by DrDalstadt...  OSTEOARTHRITIS (ICD-715.90) - DJD in both hips L>Rt w/ [facility]  done 2/09 by DrAlusio... she takes Glucosamine, Calcium, MVI, VitC & VitD. ~  8/11: c/o neck pain & CSpine films by DrSethi showed DDD, 3mm anterolisthesis C4 on C5, osteopenia... ~  2/12:  c/o continued neck discomfort ?eval by neurosurg she said?, advised rest, heat, Tramadol Prn.   OSTEOPOROSIS (ICD-733.00) - BMD by DrNeal 7/02 showed TScores -2.3 to -3.2;  regular f/u BMD from DrNeal - last 8/08 w/ TScores -2.6 to -3.2;  Vit D level checked by GYN in past & was 53 here 6/09... ~  12/09:  she reports that she stopped her Fosamax after 12 yrs Rx per DrNeal & he has rec Reclast but she is undecided & inclined to wait til next BMD and see if there has been any deterioration in measured bone density...  CVA (ICD-434.91) - Hosp 9/10 w/ blurry vision & exam showing mild field cut & left eye strabismus... eval by Neurology/ stroke team showed tiny right occipital infarct, diffuse intra /extracranial atherosclerotic dis (but no focal stenoses or interventionable lesions) & a part left 3rd nerve palsy as well... PLAVIX was added to her ASA therapy... SEE DC SUMMARY  CEREBRAL ATROPHY (ICD-331.9) - CTBrain 10/07 showed mild atrophy, otherw neg... she denies memory  problems- with minor changes on MMSE testing... 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 ~  6/10: when asked about her memory she states "it depends on what day it is" ~  1/11:  I see signs of mild deterioration in memory but she denies- eg. she couldn't recall what DrTMartin told her at [facility] about her 3rd nerve paresis, and she insisted that her mother lived to 52, then later corrected herself (she lived to 45). ~  2/12: similar signs of memory impairment w/ her c/o blurry vision & neck pain> she couldn't recall prev w/u by neuro or results.  ANXIETY (ICD-300.00) - she has severe stress having found her son at home after a suicide... she has ALPRAZOLAM for Prn use but doesn't take it... she was rec to try Zoloft per Neuro but she never filled it.   Past Surgical History  Procedure Date  . Breast lumpectomy 1960's    benign breast biopsy  . Cholecystectomy 1980    at [facility]  Marland Kitchen Appendectomy 1980    at [facility].  . Abdominal hysterectomy 07/1980    hyst and rectocele repair by Dr. Dewaine Conger  . Oculoplastic eye surgery 12/1997    in [place]  . Vv stripping years ago     Outpatient Encounter Prescriptions as of 02/28/2011  Medication Sig Dispense Refill  . ALPRAZolam (XANAX) 0.25 MG tablet Take 1/2 to 1 tablet by mouth three times daily as needed for nerves       . amLODipine (NORVASC) 10 MG tablet Take 10 mg by mouth daily.        Marland Kitchen Bioflavonoid Products (ESTER C PO) Take 1 tablet by mouth daily.        . calcium gluconate 500 MG tablet Take 500 mg by mouth daily.       . Cholecalciferol (VITAMIN D) 1000 UNITS capsule Take 1,000 Units by mouth daily.        . clopidogrel (PLAVIX) 75 MG tablet Take 75 mg by mouth daily.        . fish oil-omega-3 fatty acids 1000 MG capsule Take 2 g by mouth daily.        . Glucosamine HCl 1000 MG TABS Take 1 tablet by mouth  daily.        . Multiple Vitamin (MULTIVITAMIN PO) Take 1 tablet by mouth daily.        . Psyllium  (METAMUCIL) 30.9 % POWD 1 tsp daily         Allergies  Allergen Reactions  . ZOX:[id number] Acid+Aspartame     REACTION: "fuzzy-headed"  . Sulfamethoxazole W/Trimethoprim     REACTION: allergy to Sulfa w/ flu-like illness.    Review of Systems        See HPI - all other systems neg except as noted... The patient denies anorexia, fever, weight loss, weight gain, vision loss, decreased hearing, hoarseness, chest pain, syncope, dyspnea on exertion, peripheral edema, prolonged cough, headaches, hemoptysis, abdominal pain, melena, hematochezia, severe indigestion/heartburn, hematuria, incontinence, muscle weakness, suspicious skin lesions, transient blindness, difficulty walking, depression, unusual weight change, abnormal bleeding, enlarged lymph nodes, and angioedema.    Objective:   Physical Exam     WD,Thin, 77 y/o WF in NAD... she is moderately anxious today... GENERAL:  Alert & oriented; pleasant & cooperative... HEENT:  [place]/AT, EOM- full & WNL, EACs-clear, TMs-wnl, NOSE-pale w/ clear discharge, THROAT-clear & WNL. NECK:  Supple w/ fairROM; no JVD; normal carotid impulses w/o bruits; palp thyroid w/o change, no lymphadenopathy. CHEST:  Decr BS bilat, clear- no wheezes/ rales/ rhonchi... HEART:  Regular Rhythm; without murmurs/ rubs/ or gallops heard... ABDOMEN:  Soft & nontender; normal bowel sounds; no organomegaly or masses detected. EXT: without deformities, mild arthritic changes; no varicose veins/ +venous insuffic/ no edema. NEURO: CN's intact & no other neuro deficits... some decr ROM neck. SKIN:  neg- w/o lesions...  RADIOLOGY DATA:  Reviewed in the EPIC EMR & discussed w/ the patient...  LABORATORY DATA:  Reviewed in the EPIC EMR & discussed w/ the patient...   Assessment & Plan:   OPHTHALMOLOGY>  She was to f/u w/ new eye doctor at [facility], ?if she went, we don't have notes, she can't remember...  HBP>  BP controlled on Norvasc but  she didn't bring bottles to review compliance & reminded to bring all meds to each & every visit (she never does)...  Periph Vasc Dis/ Hx of STROKE>  On Plavix daily, no cerebral ischemic symptoms...  CHOL>  FLP is improved on diet alone, keep up the good work...  GOITER>  Denies compressive symptoms or hyper/hypo symptoms; chemically euthyroid as well & following...  DIVERTICULOSIS>  Known severe sigm divertics & reminded to take Metamucil/ Miralax regularly... We discussed Simethacone for gas symptoms.  DJD>  She had Tramadol to use prn but prefers OTC meds- Tylenol/ Advil etc...  Osteoporosis>  She gets BMDs from DrNeal, Gyn & was prev on Fosamax; this was stopped after 10+ yrs rx & DrNeal has rec reclast but she is holding off...  MEMORY LOSS>  She has some atrophy on scans & minor changes on prev MMSE; she has declined Aricept & prev tried OTC memory supplements.  ANXIETY>  As noted she feels that the Alpraz caused trouble thinking but does not want substitute medication for nerves...   Patient's Medications  New Prescriptions   No medications on file  Previous Medications   ALPRAZOLAM (XANAX) 0.25 MG TABLET    Take 1/2 to 1 tablet by mouth three times daily as needed for nerves    BIOFLAVONOID PRODUCTS (ESTER C PO)    Take 1 tablet by mouth daily.     CALCIUM GLUCONATE 500 MG TABLET    Take 500 mg by mouth daily.  CHOLECALCIFEROL (VITAMIN D) 1000 UNITS CAPSULE    Take 1,000 Units by mouth daily.     FISH OIL-OMEGA-3 FATTY ACIDS 1000 MG CAPSULE    Take 2 g by mouth daily.     GLUCOSAMINE HCL 1000 MG TABS    Take 1 tablet by mouth daily.     MULTIPLE VITAMIN (MULTIVITAMIN PO)    Take 1 tablet by mouth daily.     PSYLLIUM (METAMUCIL) 30.9 % POWD    1 tsp daily   Modified Medications   Modified Medication Previous Medication   AMLODIPINE (NORVASC) 10 MG TABLET amLODipine (NORVASC) 10 MG tablet      Take 1 tablet (10 mg total) by mouth daily.    Take 10 mg by mouth daily.      CLOPIDOGREL (PLAVIX) 75 MG TABLET clopidogrel (PLAVIX) 75 MG tablet      Take 1 tablet (75 mg total) by mouth daily.    Take 75 mg by mouth daily.    Discontinued Medications   No medications on file

## 2011-03-26 ENCOUNTER — Encounter: Payer: Self-pay | Admitting: Pulmonary Disease

## 2011-07-28 ENCOUNTER — Telehealth: Payer: Self-pay | Admitting: Pulmonary Disease

## 2011-07-28 NOTE — Telephone Encounter (Signed)
Per SN---BP has been controlled on her diet--no salt and amlodipine 5mg  daily.  If BP is now increased on same rx---suggest ov with SN on Friday afternoon at 2:30.  Please have pt bring all meds.  thanks

## 2011-07-28 NOTE — Telephone Encounter (Signed)
I spoke with pt and she is aware of SN response. Pt is going to come in and see SN tomorrow at 2:30 to discuss this with him. Nothing further was needed

## 2011-07-28 NOTE — Telephone Encounter (Signed)
I spoke with pt and she states her BP yesterday morning at 4:45 am was 164/80 and then at 9:30 it was 148/69 and then this morning at 9:20 it was 148/69. Pt states she has not taken her norvasc yet and these readings are before she takes her medication. Pt does not check ehr BP after taking her amlodipine. She states for the past 3 days she ahs felt tired, vision is not a clear as it usually is and is having some upper left arm pain. The pain does not radiate and she is not having any CP or SOB. Pt is requesting recs from SN. Please advise thanks  Allergies  Allergen Reactions  . Amoxicillin-Pot Clavulanate     REACTION: "fuzzy-headed"  . Sulfamethoxazole W-Trimethoprim     REACTION: allergy to Sulfa w/ flu-like illness.

## 2011-07-29 ENCOUNTER — Encounter: Payer: Self-pay | Admitting: Pulmonary Disease

## 2011-07-29 ENCOUNTER — Ambulatory Visit (INDEPENDENT_AMBULATORY_CARE_PROVIDER_SITE_OTHER): Payer: Medicare Other | Admitting: Pulmonary Disease

## 2011-07-29 VITALS — BP 134/70 | HR 80 | Temp 97.6°F | Ht 61.0 in | Wt 111.2 lb

## 2011-07-29 DIAGNOSIS — I739 Peripheral vascular disease, unspecified: Secondary | ICD-10-CM

## 2011-07-29 DIAGNOSIS — M1990 Unspecified osteoarthritis, unspecified site: Secondary | ICD-10-CM

## 2011-07-29 DIAGNOSIS — I10 Essential (primary) hypertension: Secondary | ICD-10-CM

## 2011-07-29 DIAGNOSIS — I059 Rheumatic mitral valve disease, unspecified: Secondary | ICD-10-CM | POA: Diagnosis not present

## 2011-07-29 DIAGNOSIS — E785 Hyperlipidemia, unspecified: Secondary | ICD-10-CM | POA: Diagnosis not present

## 2011-07-29 DIAGNOSIS — I6350 Cerebral infarction due to unspecified occlusion or stenosis of unspecified cerebral artery: Secondary | ICD-10-CM | POA: Diagnosis not present

## 2011-07-29 DIAGNOSIS — M810 Age-related osteoporosis without current pathological fracture: Secondary | ICD-10-CM

## 2011-07-29 DIAGNOSIS — F411 Generalized anxiety disorder: Secondary | ICD-10-CM

## 2011-07-29 MED ORDER — LOSARTAN POTASSIUM 50 MG PO TABS: 50.0000 mg | ORAL_TABLET | Freq: Every day | ORAL | Status: DC

## 2011-07-29 NOTE — Patient Instructions (Signed)
Today we updated your med list in our EPIC system...    Continue your current medications the same...  We decided to add LOSARTAN 50mg  one tab daily in the AM for your blood pressure...  Let's plan a follow up visit in about 6 weeks w/ FASTING blood work at that time.Marland KitchenMarland Kitchen

## 2011-07-31 ENCOUNTER — Encounter: Payer: Self-pay | Admitting: Pulmonary Disease

## 2011-07-31 NOTE — Progress Notes (Addendum)
Subjective:    Patient ID: [name], female    DOB: 05/13/1924, 78 y.o.   MRN: [id number]  HPI 78 y/o WF here for a follow up visit... she has multiple medical problems as noted below...  Followed for general medical purposes w/ hx of HBP, mild MVP, ASPVD, Goiter, Divertics, DJD w/ left THR, Osteoporosis, hx right occipital stoke & 3rd N paresis, Memory loss, & mod severe anxiety...  ~  April 26, 2010:  6 month ROV- stable overall but she stopped the Plavix on her own (due to some minor bruising) & we discussed restarting this medication due to prev stroke & risk of recurrence.    She has neck pain & saw Neurosurg (?we don't have notes) & told it was a bone spur & might need surg; for now heating pad, Tylenol, phys therapy; she exercises at the gym regularly...    BP controlled on Norvasc;  denies CP, palpit, SOB, edema;  FLP is OK on diet alone, & she declines low dose statin med;  she seems to be managing reasonably well...  ~  May 18, 2010:  Add-on for blurred vision & left neck pain w/ incr anxiety evident... states she's worried about her heart w/ these symptoms (known mild MVP, no known CAD) & EKG today is NSR, essent WNL.Marland Kitchen.  she equates the blurred vision to her stroke 8/10 but she had dysconjugate gaze & 3rd nerve paresis at that time & EOM are normal now... all this hx reviewed w/ the pt & excellent [facility]11 also reviewed w/ her including MRI/ MRA (neg- no acute process), CSpine films (signif DDD & anterolisthesis C4 on C5), etc...  she was prev rec to take Zoloft but she declined, & keeps on perseverating her concerns over heart & stroke>  we decided to treat her neck symptoms w/ rest, heat, Tramadol Prn, and rec f/u neuro check by DrSethi etal (time for 3762mo rov)... she would like Ophthalmology check for the blurring of vision but doesn't want to see DrShapiro again> we will refer to [facility] ==> (we don't have notes from DrSethi or  Ophthalmology).  ~  September 07, 2010:  762mo ROV & she relates a stressful "situation" at the [facility] regarding a hug from another member- very upsetting to her, she's been using the Alprazolam but she feels this caused trouble w/ her thinking & she wants to use PROCERA AVH supplement to help w/ her memory;  she notes "pressure" behind eyes, HAs, unable to relax> doesn't want meds & notes "if I get my nerves straightened out I'll be OK"...  ~  October 25, 2010:  6wk ROV & due for fasting blood work today... She never tried the [facility] but feels better w/ decr stress; BP controlled on Amlodipine which she remembers to take 4-5d per week she says; she is better about taking the Plavix (daily) & denies cerebral ischemic symptoms; she notes some left arm aching (works out every day) but states it resolves after 2 cups coffee... Requesting rx for shingles vaccine.  ~  February 28, 2011:  762mo ROV & she is doing well overall just c/o some gas & we discussed Simethacone preps for this- Mylicon, GasX, Phazyme, etc...  BP well controlled on Amlodip & she denies CP, palpit, SOB, edema; she continues to exercise regularly at the gym; she remains on Plavix & denies cerebral ischemic symptoms; we reviewed labs from 7/12 and FLP ok on her diet + FishOil and Thyroid remains  wnl; hx DJD w/ prev left THR & she uses OTC analgesics as needed; she is still not using the Alpraz anxiolytic...  She had the Shingles vaccine & the Flu shot this yr; Plavix & Amlodip refilled per request...  ~  Jul 29, 2011:  26mo ROV & [name] is added-on for mult symptoms> states BP has been up (readings 150-160 at home) & she is very concerned "I can feel it when it's up- I'm more anxious, not as calm" she will take 1/4th of an Alpraz0.5mg  tab & this helps she says;  Also c/o eyes feel strained & left arm/ shoulder discomfort- but it's not too bad & she refuses Ortho eval... We decided to add LOSARTAN 50mg /d to her regimen & recheck her BP in about 6 weeks... We  reviewed her prob list, meds, Xrays & labs>> BMD 5/13:  Severe osteoporosis w/ TScores -2.9 in Spine & -3.3 in Femur; also measured -5.2 in forearm/Radius... rec to start Reclast yearly...   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 eval by Neurology/ stroke team showed tiny right occipital infarct, diffuse intra /extracranial atherosclerotic dis (but no focal stenoses or interventionable lesions) & a part left 3rd nerve palsy as well... PLAVIX was added to her ASA therapy... ~  11/10:  f/u DrSethi stopped ASA, continued Plavix for her cerebrovasc dis... ~  11/10:  [facility]in at [facility]- he predicted good prognosis for spont recovery of left eye movement. ~  1/11:  her left 3rd nerve palsy has resolved...  ALLERGIC RHINITIS (ICD-477.8) - uses OTC antihistamines infrequently...  HYPERTENSION, MILD (ICD-401.1) - she has hx of HBP and on NORVASC 10mg /d...  ~  12/12:  BP= 128/72 today and 130's/ 70's at home... denies HA, fatigue, CP, palipit, dizziness, syncope, dyspnea; min edema noted. ~  5/13:  BP= 134/70 but she is quite concerned about BPs up to 150-160 at home; we decided to add LOSARTAN 50mg /d to her meds & f/u in 6 weeks...  Hx of MITRAL VALVE PROLAPSE (ICD-424.0) - baseline EKG w/ NSR, WNL;  Neg stress thallium 4/93... she denies CP, palpit, SOB, etc... she exercises regularly walking 3-4 miles and going to the gym. ~  2DEcho 9/10 showed trivial prolapse of the post leaflet, norm LVF w/ EF= 65-70%, no regional wall motion abnormalities...  PERIPHERAL VASCULAR DISEASE (ICD-443.9) - on PLAVIX 75mg /d... CTAbd 8/06 showed thickening of Ao wall & prob plaque;  CDopplers 7/08 showed 0-39% bilat ICA stenoses & plaque in Rt subclavian art (plus Rt Thyroid Mass). ~  MRI/ MRA Brain & Neck 9/10 showed intra & extracranial  atherosclerotic changes w/o interventionable lesions & no hemodynamically signif stenoses in the neck...  VENOUS INSUFFICIENCY (ICD-459.81) - she had a vein stripping yrs ago> she follows a low sodium diet & has no edema at this time...  HYPERCHOLESTEROLEMIA, BORDERLINE (ICD-272.4) - on diet + FISH OIL, she does not want meds. ~  FLP 1/11 showed TChol 199, TG 109, HDL 66, LDL 112 ~  FLP 7/11 showed TChol 212, TG 66, HDL 69, LDL 133 ~  FLP 1/12 showed TChol 208, TG 106, HDL 66, LDL 124 ~  FLP 7/12 showed TChol 198, TG 47, HDL 75, LDL 114  GOITER, UNSPECIFIED (ICD-240.9) - Old CXR's show deviation of trachea to left by thyroid & CDoppler showed right  thyroid mass... she has no local symptoms, swallowing difficulty & is clinically euthyroid...  ~  labs 6/09 showed TSH= 1.04 ~  labs 6/10 showed TSH= 0.98 ~  labs 7/11 showed TSH= 1.40 ~  labs 7/12 showed TSH= 1.30  DIVERTICULOSIS OF COLON (ICD-562.10) & HEMORRHOIDS (ICD-455.6) - Hx of very tortuous & redundant colon w/ severe sigmoid diverticulosis;  last colonoscopy w/ pediatric scope 10/98 was difficult & subseq barium enema showed severe divertics otherwise neg;  Rx fiber, anusol, etc...  CTAbd 1/03 w/ divertics otherwise neg;  she is reminded to take the MIRALAX/ Metamucil regularly.  UNSPECIFIED CYSTITIS (ICD-595.9) - Urology eval 8/09 by DrDalstadt...  OSTEOARTHRITIS (ICD-715.90) - DJD in both hips L>Rt w/ LTHR done 2/09 by DrAlusio... she takes Glucosamine, Calcium, MVI, VitC & VitD. ~  8/11: c/o neck pain & CSpine films by DrSethi showed DDD, 3mm anterolisthesis C4 on C5, osteopenia... ~  2/12:  c/o continued neck discomfort ?eval by neurosurg she said?, advised rest, heat, Tramadol Prn.   OSTEOPOROSIS (ICD-733.00) - BMD by DrNeal 7/02 showed TScores -2.3 to -3.2;  regular f/u BMD from DrNeal - last 8/08 w/ TScores -2.6 to -3.2;  Vit D level checked by GYN in past & was 53 here 6/09... ~  12/09:  she reports that she stopped her Fosamax  after 12 yrs Rx per DrNeal & he has rec Reclast but she is undecided & inclined to wait til next BMD and see if there has been any deterioration in measured bone density... ~  BMD 5/13:  Severe osteoporosis w/ TScores -2.9 in Spine & -3.3 in Femur; also measured -5.2 in forearm/Radius... rec to start Reclast yearly...  CVA (ICD-434.91) - Hosp 9/10 w/ blurry vision & exam showing mild field cut & left eye strabismus... eval by Neurology/ stroke team showed tiny right occipital infarct, diffuse intra /extracranial atherosclerotic dis (but no focal stenoses or interventionable lesions) & a part left 3rd nerve palsy as well... PLAVIX was added to her ASA therapy... SEE DC SUMMARY  CEREBRAL ATROPHY (ICD-331.9) - CTBrain 10/07 showed mild atrophy, otherw neg... she denies memory problems- with minor changes on MMSE testing... 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 ~  6/10: when asked about her memory she states "it depends on what day it is" ~  1/11:  I see signs of mild deterioration in memory but she denies- eg. she couldn't recall what DrTMartin told her at [facility] about her 3rd nerve paresis, and she insisted that her mother lived to 33, then later corrected herself (she lived to 24). ~  2/12: similar signs of memory impairment w/ her c/o blurry vision & neck pain> she couldn't recall prev w/u by neuro or results.  ANXIETY (ICD-300.00) - she has severe stress having found her son at home after a suicide... she has ALPRAZOLAM for Prn use but doesn't take it... she was rec to try Zoloft per Neuro but she never filled it.   Past Surgical History  Procedure Date  . Breast lumpectomy 1960's    benign breast biopsy  . Cholecystectomy 1980    at [facility]  Marland Kitchen Appendectomy 1980    at [facility].  . Abdominal hysterectomy 07/1980    hyst and rectocele repair by Dr. Dewaine Conger  . Oculoplastic eye surgery 12/1997    in [place]  . Vv stripping years ago     Outpatient Encounter  Prescriptions as of 07/29/2011  Medication Sig Dispense Refill  . ALPRAZolam Prudy Feeler)  0.25 MG tablet Take 1/2 to 1 tablet by mouth three times daily as needed for nerves       . amLODipine (NORVASC) 10 MG tablet Take 1 tablet (10 mg total) by mouth daily.  30 tablet  11  . Bioflavonoid Products (ESTER C PO) Take 1 tablet by mouth daily.        . calcium gluconate 500 MG tablet Take 500 mg by mouth daily.       . Cholecalciferol (VITAMIN D) 1000 UNITS capsule Take 1,000 Units by mouth daily.        . clopidogrel (PLAVIX) 75 MG tablet Take 1 tablet (75 mg total) by mouth daily.  30 tablet  11  . fish oil-omega-3 fatty acids 1000 MG capsule Take 2 g by mouth daily.        . Glucosamine HCl 1000 MG TABS Take 1 tablet by mouth daily.        . Multiple Vitamin (MULTIVITAMIN PO) Take 1 tablet by mouth daily.        . Psyllium (METAMUCIL) 30.9 % POWD 1 tsp daily       . losartan (COZAAR) 50 MG tablet Take 1 tablet (50 mg total) by mouth daily.  30 tablet  5    Allergies  Allergen Reactions  . Amoxicillin-Pot Clavulanate     REACTION: "fuzzy-headed"  . Sulfamethoxazole W-Trimethoprim     REACTION: allergy to Sulfa w/ flu-like illness.    Review of Systems        See HPI - all other systems neg except as noted... The patient denies anorexia, fever, weight loss, weight gain, vision loss, decreased hearing, hoarseness, chest pain, syncope, dyspnea on exertion, peripheral edema, prolonged cough, headaches, hemoptysis, abdominal pain, melena, hematochezia, severe indigestion/heartburn, hematuria, incontinence, muscle weakness, suspicious skin lesions, transient blindness, difficulty walking, depression, unusual weight change, abnormal bleeding, enlarged lymph nodes, and angioedema.    Objective:   Physical Exam     WD,Thin, 78 y/o WF in NAD... she is moderately anxious today... GENERAL:  Alert & oriented; pleasant & cooperative... HEENT:  [place]/AT, EOM- full & WNL, EACs-clear, TMs-wnl, NOSE-pale w/  clear discharge, THROAT-clear & WNL. NECK:  Supple w/ fairROM; no JVD; normal carotid impulses w/o bruits; palp thyroid w/o change, no lymphadenopathy. CHEST:  Decr BS bilat, clear- no wheezes/ rales/ rhonchi... HEART:  Regular Rhythm; without murmurs/ rubs/ or gallops heard... ABDOMEN:  Soft & nontender; normal bowel sounds; no organomegaly or masses detected. EXT: without deformities, mild arthritic changes; no varicose veins/ +venous insuffic/ no edema. NEURO: CN's intact & no other neuro deficits... some decr ROM neck. SKIN:  neg- w/o lesions...  RADIOLOGY DATA:  Reviewed in the EPIC EMR & discussed w/ the patient...  LABORATORY DATA:  Reviewed in the EPIC EMR & discussed w/ the patient...   Assessment & Plan:   OPHTHALMOLOGY>  She was to f/u w/ new eye doctor at [facility], ?if she went, we don't have notes, she can't remember...  HBP>  She is quite concerned about her BP readings at home in the 150-160 range on Norvasc10mg /d which she assures me she takes regularly even though she didn't bring bottles to the OV (again); we decided to add Losartan 50mg /d & she will monitor BP at home & f/u in 6 weeks.Alfonso Ellis Vasc Dis/ Hx of STROKE>  On Plavix daily, no cerebral ischemic symptoms...  CHOL>  FLP has been OK on diet alone, keep up the good work...  GOITER>  Denies compressive  symptoms or hyper/hypo symptoms; chemically euthyroid as well & following...  DIVERTICULOSIS>  Known severe sigm divertics & reminded to take Metamucil/ Miralax regularly... We discussed Simethacone for gas symptoms.  DJD>  She had Tramadol to use prn but prefers OTC meds- Tylenol/ Advil etc...  Osteoporosis>  She gets BMDs from DrNeal, Gyn & was prev on Fosamax; this was stopped after 10+ yrs rx & DrNeal has rec reclast but she is holding off...  MEMORY LOSS>  She has some atrophy on scans & minor changes on prev MMSE; she has declined Aricept & prev tried OTC memory supplements.  ANXIETY>  As  noted she feels that the Alpraz caused trouble thinking but does not want substitute medication for nerves...   Patient's Medications  New Prescriptions   LOSARTAN (COZAAR) 50 MG TABLET    Take 1 tablet (50 mg total) by mouth daily.  Previous Medications   ALPRAZOLAM (XANAX) 0.25 MG TABLET    Take 1/2 to 1 tablet by mouth three times daily as needed for nerves    AMLODIPINE (NORVASC) 10 MG TABLET    Take 1 tablet (10 mg total) by mouth daily.   BIOFLAVONOID PRODUCTS (ESTER C PO)    Take 1 tablet by mouth daily.     CALCIUM GLUCONATE 500 MG TABLET    Take 500 mg by mouth daily.    CHOLECALCIFEROL (VITAMIN D) 1000 UNITS CAPSULE    Take 1,000 Units by mouth daily.     CLOPIDOGREL (PLAVIX) 75 MG TABLET    Take 1 tablet (75 mg total) by mouth daily.   FISH OIL-OMEGA-3 FATTY ACIDS 1000 MG CAPSULE    Take 2 g by mouth daily.     GLUCOSAMINE HCL 1000 MG TABS    Take 1 tablet by mouth daily.     MULTIPLE VITAMIN (MULTIVITAMIN PO)    Take 1 tablet by mouth daily.     PSYLLIUM (METAMUCIL) 30.9 % POWD    1 tsp daily   Modified Medications   No medications on file  Discontinued Medications   No medications on file

## 2011-08-02 ENCOUNTER — Ambulatory Visit (INDEPENDENT_AMBULATORY_CARE_PROVIDER_SITE_OTHER)
Admission: RE | Admit: 2011-08-02 | Discharge: 2011-08-02 | Disposition: A | Discharge status: Home / Self Care | Payer: Medicare Other | Source: Ambulatory Visit

## 2011-08-02 DIAGNOSIS — M810 Age-related osteoporosis without current pathological fracture: Secondary | ICD-10-CM

## 2011-08-08 ENCOUNTER — Telehealth: Payer: Self-pay | Admitting: Pulmonary Disease

## 2011-08-08 NOTE — Telephone Encounter (Signed)
I spoke with pt and she was wanting to know if she was going to be getting the reclast infusion on Monday w/ TP. I advised her according to the notes she was coming in to discuss the reclast with TP not get the infusion. She voiced her understanding and had no question

## 2011-08-08 NOTE — Telephone Encounter (Signed)
Called and spoke with pt and her results of BMD and she is aware of appt scheduled to see TP on 5-20  At 2:30

## 2011-08-15 ENCOUNTER — Encounter: Payer: Self-pay | Admitting: Adult Health

## 2011-08-15 ENCOUNTER — Ambulatory Visit (INDEPENDENT_AMBULATORY_CARE_PROVIDER_SITE_OTHER): Payer: Medicare Other | Admitting: Adult Health

## 2011-08-15 VITALS — BP 130/66 | HR 73 | Temp 97.3°F | Ht 61.0 in | Wt 110.4 lb

## 2011-08-15 DIAGNOSIS — M810 Age-related osteoporosis without current pathological fracture: Secondary | ICD-10-CM

## 2011-08-15 NOTE — Progress Notes (Signed)
Subjective:    Patient ID: [name], female    DOB: Dec 16, 1924, 78 y.o.   MRN: [id number]  HPI 78 y/o WF with hx of  multiple medical problems as noted below...  Followed for general medical purposes w/ hx of HBP, mild MVP, ASPVD, Goiter, Divertics, DJD w/ left THR, Osteoporosis, hx right occipital stoke & 3rd N paresis, Memory loss, & mod severe anxiety...  ~  April 26, 2010:  6 month ROV- stable overall but she stopped the Plavix on her own (due to some minor bruising) & we discussed restarting this medication due to prev stroke & risk of recurrence.    She has neck pain & saw Neurosurg (?we don't have notes) & told it was a bone spur & might need surg; for now heating pad, Tylenol, phys therapy; she exercises at the gym regularly...    BP controlled on Norvasc;  denies CP, palpit, SOB, edema;  FLP is OK on diet alone, & she declines low dose statin med;  she seems to be managing reasonably well...  ~  May 18, 2010:  Add-on for blurred vision & left neck pain w/ incr anxiety evident... states she's worried about her heart w/ these symptoms (known mild MVP, no known CAD) & EKG today is NSR, essent WNL.Marland Kitchen.  she equates the blurred vision to her stroke 8/10 but she had dysconjugate gaze & 3rd nerve paresis at that time & EOM are normal now... all this hx reviewed w/ the pt & excellent Neuro note from [facility] 9/11 also reviewed w/ her including MRI/ MRA (neg- no acute process), CSpine films (signif DDD & anterolisthesis C4 on C5), etc...  she was prev rec to take Zoloft but she declined, & keeps on perseverating her concerns over heart & stroke>  we decided to treat her neck symptoms w/ rest, heat, Tramadol Prn, and rec f/u neuro check by DrSethi etal (time for 29mo rov)... she would like Ophthalmology check for the blurring of vision but doesn't want to see DrShapiro again> we will refer to [facility] ==> (we don't have notes from DrSethi or Ophthalmology).  ~  September 07, 2010:  67mo  ROV & she relates a stressful "situation" at the [facility] regarding a hug from another member- very upsetting to her, she's been using the Alprazolam but she feels this caused trouble w/ her thinking & she wants to use PROCERA AVH supplement to help w/ her memory;  she notes "pressure" behind eyes, HAs, unable to relax> doesn't want meds & notes "if I get my nerves straightened out I'll be OK"...  ~  October 25, 2010:  6wk ROV & due for fasting blood work today... She never tried the [facility] but feels better w/ decr stress; BP controlled on Amlodipine which she remembers to take 4-5d per week she says; she is better about taking the Plavix (daily) & denies cerebral ischemic symptoms; she notes some left arm aching (works out every day) but states it resolves after 2 cups coffee... Requesting rx for shingles vaccine.  ~  February 28, 2011:  67mo ROV & she is doing well overall just c/o some gas & we discussed Simethacone preps for this- Mylicon, GasX, Phazyme, etc...  BP well controlled on Amlodip & she denies CP, palpit, SOB, edema; she continues to exercise regularly at the gym; she remains on Plavix & denies cerebral ischemic symptoms; we reviewed labs from 7/12 and FLP ok on her diet + FishOil and Thyroid remains wnl; hx DJD w/  prev left THR & she uses OTC analgesics as needed; she is still not using the Alpraz anxiolytic...  She had the Shingles vaccine & the Flu shot this yr; Plavix & Amlodip refilled per request...  ~  Jul 29, 2011:  1mo ROV & [name] is added-on for mult symptoms> states BP has been up (readings 150-160 at home) & she is very concerned "I can feel it when it's up- I'm more anxious, not as calm" she will take 1/4th of an Alpraz0.5mg  tab & this helps she says;  Also c/o eyes feel strained & left arm/ shoulder discomfort- but it's not too bad & she refuses Ortho eval... We decided to add LOSARTAN 50mg /d to her regimen & recheck her BP in about 6 weeks... We reviewed her prob list, meds, Xrays &  labs>> BMD 5/13:  Severe osteoporosis w/ TScores -2.9 in Spine & -3.3 in Femur; also measured -5.2 in forearm/Radius... rec to start Reclast yearly...  08/15/11 Follow up  Pt returns for follow up of osteoporosis to discuss Reclast infusion.  Previously on fosamax for 48yrs , stopped 2009 .  BMD done 5/13:  Severe osteoporosis w/ TScores -2.9 in Spine & -3.3 in Femur; also measured -5.2 in forearm/Radius. She has been recommended to start Reclast.  We discussed the annual infusion and benefits of drug. She has also been on a bisphosnate drug holiday so would be a good time to restart therapy with the recent decline in BMD We discussed calcium w/ vitamin d , weight bearing exercises She wants to think about this for a while and does not want to make a decision on the reclast yet.    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 eval by Neurology/ stroke team showed tiny right occipital infarct, diffuse intra /extracranial atherosclerotic dis (but no focal stenoses or interventionable lesions) & a part left 3rd nerve palsy as well... PLAVIX was added to her ASA therapy... ~  11/10:  f/u DrSethi stopped ASA, continued Plavix for her cerebrovasc dis... ~  11/10:  [facility]- he predicted good prognosis for spont recovery of left eye movement. ~  1/11:  her left 3rd nerve palsy has resolved...  ALLERGIC RHINITIS (ICD-477.8) - uses OTC antihistamines infrequently...  HYPERTENSION, MILD (ICD-401.1) - she has hx of HBP and on NORVASC 10mg /d...  ~  12/12:  BP= 128/72 today and 130's/ 70's at home... denies HA, fatigue, CP, palipit, dizziness, syncope, dyspnea; min edema noted. ~  5/13:  BP= 134/70 but she is quite concerned about BPs up to 150-160 at home; we decided to add LOSARTAN 50mg /d to her meds & f/u in 6 weeks...  Hx of  MITRAL VALVE PROLAPSE (ICD-424.0) - baseline EKG w/ NSR, WNL;  Neg stress thallium 4/93... she denies CP, palpit, SOB, etc... she exercises regularly walking 3-4 miles and going to the gym. ~  2DEcho 9/10 showed trivial prolapse of the post leaflet, norm LVF w/ EF= 65-70%, no regional wall motion abnormalities...  PERIPHERAL VASCULAR DISEASE (ICD-443.9) - on PLAVIX 75mg /d... CTAbd 8/06 showed thickening of Ao wall & prob plaque;  CDopplers 7/08 showed 0-39% bilat ICA stenoses & plaque in Rt subclavian art (plus Rt Thyroid Mass). ~  MRI/ MRA Brain & Neck 9/10 showed intra & extracranial atherosclerotic changes w/o interventionable lesions & no hemodynamically  signif stenoses in the neck...  VENOUS INSUFFICIENCY (ICD-459.81) - she had a vein stripping yrs ago> she follows a low sodium diet & has no edema at this time...  HYPERCHOLESTEROLEMIA, BORDERLINE (ICD-272.4) - on diet + FISH OIL, she does not want meds. ~  FLP 1/11 showed TChol 199, TG 109, HDL 66, LDL 112 ~  FLP 7/11 showed TChol 212, TG 66, HDL 69, LDL 133 ~  FLP 1/12 showed TChol 208, TG 106, HDL 66, LDL 124 ~  FLP 7/12 showed TChol 198, TG 47, HDL 75, LDL 114  GOITER, UNSPECIFIED (ICD-240.9) - Old CXR's show deviation of trachea to left by thyroid & CDoppler showed right thyroid mass... she has no local symptoms, swallowing difficulty & is clinically euthyroid...  ~  labs 6/09 showed TSH= 1.04 ~  labs 6/10 showed TSH= 0.98 ~  labs 7/11 showed TSH= 1.40 ~  labs 7/12 showed TSH= 1.30  DIVERTICULOSIS OF COLON (ICD-562.10) & HEMORRHOIDS (ICD-455.6) - Hx of very tortuous & redundant colon w/ severe sigmoid diverticulosis;  last colonoscopy w/ pediatric scope 10/98 was difficult & subseq barium enema showed severe divertics otherwise neg;  Rx fiber, anusol, etc...  CTAbd 1/03 w/ divertics otherwise neg;  she is reminded to take the MIRALAX/ Metamucil regularly.  UNSPECIFIED CYSTITIS (ICD-595.9) - Urology eval 8/09 by  DrDalstadt...  OSTEOARTHRITIS (ICD-715.90) - DJD in both hips L>Rt w/ LTHR done 2/09 by DrAlusio... she takes Glucosamine, Calcium, MVI, VitC & VitD. ~  8/11: c/o neck pain & CSpine films by DrSethi showed DDD, 3mm anterolisthesis C4 on C5, osteopenia... ~  2/12:  c/o continued neck discomfort ?eval by neurosurg she said?, advised rest, heat, Tramadol Prn.   OSTEOPOROSIS (ICD-733.00) - BMD by DrNeal 7/02 showed TScores -2.3 to -3.2;  regular f/u BMD from DrNeal - last 8/08 w/ TScores -2.6 to -3.2;  Vit D level checked by GYN in past & was 53 here 6/09... ~  12/09:  she reports that she stopped her Fosamax after 12 yrs Rx per DrNeal & he has rec Reclast but she is undecided & inclined to wait til next BMD and see if there has been any deterioration in measured bone density... ~  BMD 5/13:  Severe osteoporosis w/ TScores -2.9 in Spine & -3.3 in Femur; also measured -5.2 in forearm/Radius... rec to start Reclast yearly...  CVA (ICD-434.91) - Hosp 9/10 w/ blurry vision & exam showing mild field cut & left eye strabismus... eval by Neurology/ stroke team showed tiny right occipital infarct, diffuse intra /extracranial atherosclerotic dis (but no focal stenoses or interventionable lesions) & a part left 3rd nerve palsy as well... PLAVIX was added to her ASA therapy... SEE DC SUMMARY  CEREBRAL ATROPHY (ICD-331.9) - CTBrain 10/07 showed mild atrophy, otherw neg... she denies memory problems- with minor changes on MMSE testing... 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 ~  6/10: when asked about her memory she states "it depends on what day it is" ~  1/11:  I see signs of mild deterioration in memory but she denies- eg. she couldn't recall what DrTMartin told her at [facility] about her 3rd nerve paresis, and she insisted that her mother lived to 16, then later corrected herself (she lived to 81). ~  2/12: similar signs of memory impairment w/ her c/o blurry vision & neck pain> she couldn't  recall prev w/u by neuro or results.  ANXIETY (ICD-300.00) - she has severe stress having found her son at home  after a suicide... she has ALPRAZOLAM for Prn use but doesn't take it... she was rec to try Zoloft per Neuro but she never filled it.   Past Surgical History  Procedure Date  . Breast lumpectomy 1960's    benign breast biopsy  . Cholecystectomy 1980    at [facility]  Marland Kitchen Appendectomy 1980    at [facility].  . Abdominal hysterectomy 07/1980    hyst and rectocele repair by Dr. Dewaine Conger  . Oculoplastic eye surgery 12/1997    in [place]  . Vv stripping years ago     Outpatient Encounter Prescriptions as of 08/15/2011  Medication Sig Dispense Refill  . ALPRAZolam (XANAX) 0.25 MG tablet Take 1/2 to 1 tablet by mouth three times daily as needed for nerves       . amLODipine (NORVASC) 10 MG tablet Take 1 tablet (10 mg total) by mouth daily.  30 tablet  11  . Bioflavonoid Products (ESTER C PO) Take 1 tablet by mouth daily.        . calcium gluconate 500 MG tablet Take 500 mg by mouth daily.       . Cholecalciferol (VITAMIN D) 1000 UNITS capsule Take 1,000 Units by mouth daily.        . clopidogrel (PLAVIX) 75 MG tablet Take 1 tablet (75 mg total) by mouth daily.  30 tablet  11  . fish oil-omega-3 fatty acids 1000 MG capsule Take 2 g by mouth daily.        . Glucosamine HCl 1000 MG TABS Take 1 tablet by mouth daily.        Marland Kitchen losartan (COZAAR) 50 MG tablet Take 1 tablet (50 mg total) by mouth daily.  30 tablet  5  . Multiple Vitamin (MULTIVITAMIN PO) Take 1 tablet by mouth daily.        . Psyllium (METAMUCIL) 30.9 % POWD 1 tsp daily         Allergies  Allergen Reactions  . Amoxicillin-Pot Clavulanate     REACTION: "fuzzy-headed"  . Sulfamethoxazole W-Trimethoprim     REACTION: allergy to Sulfa w/ flu-like illness.    Review of Systems        Constitutional:   No  weight loss, night sweats,  Fevers, chills, fatigue, or  lassitude.  HEENT:   No headaches,  Difficulty swallowing,   Tooth/dental problems, or  Sore throat,                No sneezing, itching, ear ache, nasal congestion, post nasal drip,   CV:  No chest pain,  Orthopnea, PND, swelling in lower extremities, anasarca, dizziness, palpitations, syncope.   GI  No heartburn, indigestion, abdominal pain, nausea, vomiting, diarrhea, change in bowel habits, loss of appetite, bloody stools.   Resp: No shortness of breath with exertion or at rest.  No excess mucus, no productive cough,  No non-productive cough,  No coughing up of blood.  No change in color of mucus.  No wheezing.  No chest wall deformity  Skin: no rash or lesions.  GU: no dysuria, change in color of urine, no urgency or frequency.  No flank pain, no hematuria   MS:  No joint pain or swelling.  No decreased range of motion.  No back pain.  Psych:  No change in mood or affect. No depression or anxiety.  No memory loss.        Objective:   Physical Exam     WD,Thin, 78 y/o WF in NAD.Marland KitchenMarland Kitchen  she is moderately anxious today... GENERAL:  Alert & oriented; pleasant & cooperative... HEENT:  /AT, , EACs-clear, TMs-wnl, NOSE-pale w/ clear discharge, THROAT-clear & WNL. NECK:  Supple w/ fairROM; no JVD; normal carotid impulses w/o bruits; palp thyroid w/o change, no lymphadenopathy. CHEST:  Decr BS bilat, clear- no wheezes/ rales/ rhonchi... HEART:  Regular Rhythm; without murmurs/ rubs/ or gallops heard... ABDOMEN:  Soft & nontender; normal bowel sounds; no organomegaly or masses detected. EXT: without deformities, mild arthritic changes; no varicose veins/ +venous insuffic/ no edema. NEURO: nonfocal   Assessment & Plan:

## 2011-08-15 NOTE — Patient Instructions (Signed)
Begin OScal D Twice daily   Continue on Vitamin D 3 daily  If you change your mind regarding Reclast for your bones- please call back so we can set up  follow up with Dr. Kriste Basque  As planned and As needed   Return for fasting labs as planned

## 2011-08-19 NOTE — Assessment & Plan Note (Signed)
Discussed reclast in detail , reviewed her BMD results  Cautioned on regards to her high risk for fx and the complications of fx in the elderly  She is still undecided on Reclast , will call back if she changes her mind.  She is to continue on calcium and vitamin d

## 2011-08-30 ENCOUNTER — Telehealth: Payer: Self-pay | Admitting: Pulmonary Disease

## 2011-08-30 NOTE — Telephone Encounter (Signed)
Please advise Dr. Kriste Basque what labs pt will need thanks

## 2011-08-30 NOTE — Telephone Encounter (Signed)
Labs are already in the computer and the pt is aware.

## 2011-09-12 ENCOUNTER — Other Ambulatory Visit (INDEPENDENT_AMBULATORY_CARE_PROVIDER_SITE_OTHER): Payer: Medicare Other

## 2011-09-12 DIAGNOSIS — M810 Age-related osteoporosis without current pathological fracture: Secondary | ICD-10-CM

## 2011-09-12 DIAGNOSIS — E049 Nontoxic goiter, unspecified: Secondary | ICD-10-CM | POA: Diagnosis not present

## 2011-09-12 DIAGNOSIS — I10 Essential (primary) hypertension: Secondary | ICD-10-CM

## 2011-09-12 DIAGNOSIS — E785 Hyperlipidemia, unspecified: Secondary | ICD-10-CM | POA: Diagnosis not present

## 2011-09-12 DIAGNOSIS — K5730 Diverticulosis of large intestine without perforation or abscess without bleeding: Secondary | ICD-10-CM | POA: Diagnosis not present

## 2011-09-12 LAB — HEPATIC FUNCTION PANEL
ALT: 15 U/L (ref 0–35)
Albumin: 3.8 g/dL (ref 3.5–5.2)
Bilirubin, Direct: 0.1 mg/dL (ref 0.0–0.3)
Total Protein: 6.7 g/dL (ref 6.0–8.3)

## 2011-09-12 LAB — CBC WITH DIFFERENTIAL/PLATELET
Basophils Absolute: 0.1 10*3/uL (ref 0.0–0.1)
Eosinophils Absolute: 0.1 10*3/uL (ref 0.0–0.7)
Hemoglobin: 12.8 g/dL (ref 12.0–15.0)
Lymphocytes Relative: 12.6 % (ref 12.0–46.0)
MCHC: 33.7 g/dL (ref 30.0–36.0)
Monocytes Relative: 7.6 % (ref 3.0–12.0)
Neutrophils Relative %: 76.6 % (ref 43.0–77.0)
RBC: 3.94 Mil/uL (ref 3.87–5.11)
RDW: 12.6 % (ref 11.5–14.6)

## 2011-09-12 LAB — BASIC METABOLIC PANEL
CO2: 25 mEq/L (ref 19–32)
Calcium: 9.1 mg/dL (ref 8.4–10.5)
Chloride: 99 mEq/L (ref 96–112)
Creatinine, Ser: 0.8 mg/dL (ref 0.4–1.2)
Glucose, Bld: 82 mg/dL (ref 70–99)

## 2011-09-12 LAB — LIPID PANEL
Cholesterol: 190 mg/dL (ref 0–200)
HDL: 61.1 mg/dL (ref >39.00)
Total CHOL/HDL Ratio: 3
Triglycerides: 88 mg/dL (ref 0.0–149.0)

## 2011-09-13 LAB — VITAMIN D 25 HYDROXY (VIT D DEFICIENCY, FRACTURES): Vit D, 25-Hydroxy: 50 ng/mL (ref 30–89)

## 2011-09-19 ENCOUNTER — Ambulatory Visit (INDEPENDENT_AMBULATORY_CARE_PROVIDER_SITE_OTHER): Payer: Medicare Other | Admitting: Pulmonary Disease

## 2011-09-19 ENCOUNTER — Encounter: Payer: Self-pay | Admitting: Pulmonary Disease

## 2011-09-19 VITALS — BP 142/72 | HR 70 | Temp 97.0°F | Ht 61.0 in | Wt 111.0 lb

## 2011-09-19 DIAGNOSIS — M1990 Unspecified osteoarthritis, unspecified site: Secondary | ICD-10-CM

## 2011-09-19 DIAGNOSIS — I739 Peripheral vascular disease, unspecified: Secondary | ICD-10-CM

## 2011-09-19 DIAGNOSIS — E785 Hyperlipidemia, unspecified: Secondary | ICD-10-CM

## 2011-09-19 DIAGNOSIS — I6350 Cerebral infarction due to unspecified occlusion or stenosis of unspecified cerebral artery: Secondary | ICD-10-CM

## 2011-09-19 DIAGNOSIS — G319 Degenerative disease of nervous system, unspecified: Secondary | ICD-10-CM

## 2011-09-19 DIAGNOSIS — F411 Generalized anxiety disorder: Secondary | ICD-10-CM

## 2011-09-19 DIAGNOSIS — I059 Rheumatic mitral valve disease, unspecified: Secondary | ICD-10-CM

## 2011-09-19 DIAGNOSIS — I10 Essential (primary) hypertension: Secondary | ICD-10-CM | POA: Diagnosis not present

## 2011-09-19 DIAGNOSIS — M810 Age-related osteoporosis without current pathological fracture: Secondary | ICD-10-CM

## 2011-09-19 DIAGNOSIS — E049 Nontoxic goiter, unspecified: Secondary | ICD-10-CM

## 2011-09-19 DIAGNOSIS — K5730 Diverticulosis of large intestine without perforation or abscess without bleeding: Secondary | ICD-10-CM

## 2011-09-19 NOTE — Patient Instructions (Addendum)
Today we updated your med list in our EPIC system...    Continue your current medications the same...  We reviewed your recent FASTING blood work & gave you a copy for your records...  We discussed the need for Osteoporosis treatment like RECLAST...    Let me know your decision or if you would like me to refer you for a second opinion consult w/ an endocrinologist...  Call for any questions...  Let's plan a follow up visit in 6 months.Marland KitchenMarland Kitchen

## 2011-09-19 NOTE — Progress Notes (Signed)
Subjective:    Patient ID: [name], female    DOB: 05/13/1924, 78 y.o.   MRN: [id number]  HPI 78 y/o WF here for a follow up visit... she has multiple medical problems as noted below...  Followed for general medical purposes w/ hx of HBP, mild MVP, ASPVD, Goiter, Divertics, DJD w/ left THR, Osteoporosis, hx right occipital stoke & 3rd N paresis, Memory loss, & mod severe anxiety...  ~  April 26, 2010:  6 month ROV- stable overall but she stopped the Plavix on her own (due to some minor bruising) & we discussed restarting this medication due to prev stroke & risk of recurrence.    She has neck pain & saw Neurosurg (?we don't have notes) & told it was a bone spur & might need surg; for now heating pad, Tylenol, phys therapy; she exercises at the gym regularly...    BP controlled on Norvasc;  denies CP, palpit, SOB, edema;  FLP is OK on diet alone, & she declines low dose statin med;  she seems to be managing reasonably well...  ~  May 18, 2010:  Add-on for blurred vision & left neck pain w/ incr anxiety evident... states she's worried about her heart w/ these symptoms (known mild MVP, no known CAD) & EKG today is NSR, essent WNL.Marland Kitchen.  she equates the blurred vision to her stroke 8/10 but she had dysconjugate gaze & 3rd nerve paresis at that time & EOM are normal now... all this hx reviewed w/ the pt & excellent [facility]11 also reviewed w/ her including MRI/ MRA (neg- no acute process), CSpine films (signif DDD & anterolisthesis C4 on C5), etc...  she was prev rec to take Zoloft but she declined, & keeps on perseverating her concerns over heart & stroke>  we decided to treat her neck symptoms w/ rest, heat, Tramadol Prn, and rec f/u neuro check by [name] etal (time for 3762mo rov)... she would like Ophthalmology check for the blurring of vision but doesn't want to see [name] again> we will refer to [facility] ==> (we don't have notes from [name] or  Ophthalmology).  ~  September 07, 2010:  762mo ROV & she relates a stressful "situation" at the [facility] regarding a hug from another member- very upsetting to her, she's been using the Alprazolam but she feels this caused trouble w/ her thinking & she wants to use PROCERA AVH supplement to help w/ her memory;  she notes "pressure" behind eyes, HAs, unable to relax> doesn't want meds & notes "if I get my nerves straightened out I'll be OK"...  ~  October 25, 2010:  6wk ROV & due for fasting blood work today... She never tried the [facility] but feels better w/ decr stress; BP controlled on Amlodipine which she remembers to take 4-5d per week she says; she is better about taking the Plavix (daily) & denies cerebral ischemic symptoms; she notes some left arm aching (works out every day) but states it resolves after 2 cups coffee... Requesting rx for shingles vaccine.  ~  February 28, 2011:  762mo ROV & she is doing well overall just c/o some gas & we discussed Simethacone preps for this- Mylicon, GasX, Phazyme, etc...  BP well controlled on Amlodip & she denies CP, palpit, SOB, edema; she continues to exercise regularly at the gym; she remains on Plavix & denies cerebral ischemic symptoms; we reviewed labs from 7/12 and FLP ok on her diet + FishOil and Thyroid remains  wnl; hx DJD w/ prev left THR & she uses OTC analgesics as needed; she is still not using the Alpraz anxiolytic...  She had the Shingles vaccine & the Flu shot this yr; Plavix & Amlodip refilled per request...  ~  Jul 29, 2011:  28mo ROV & [name] is added-on for mult symptoms> states BP has been up (readings 150-160 at home) & she is very concerned "I can feel it when it's up- I'm more anxious, not as calm" she will take 1/4th of an Alpraz0.5mg  tab & this helps she says;  Also c/o eyes feel strained & left arm/ shoulder discomfort- but it's not too bad & she refuses Ortho eval... We decided to add LOSARTAN 50mg /d to her regimen & recheck her BP in about 6 weeks... We  reviewed her prob list, meds, Xrays & labs>> BMD 5/13:  Severe osteoporosis w/ TScores -2.9 in Spine & -3.3 in Femur; also measured -5.2 in forearm/Radius... rec to start Reclast yearly...  ~  September 19, 2011:  6wk ROV & [name] notes some trouble w/ her memory (see below) and is here to review the need for Reclast to treat her osteoporosis (see below);  Oddly enough- while she refuses interventions for these important medical issues, her CC today is a minor discoloration of her nails thast concerns her & she is referred to [facility] for this analysis...    HBP> on Norvasc10, Cozaar50;  BP= 142/72 & she denies CP, palpit, SOB, edema...    MVP> she denies CP, palpit, and exercises daily at he gym...    ASPVD> on Plavix75; she denies cerebral ischemic symptoms...    Chol> on FishOil; FLP looks ok showing TChol 190, TG 88, HDL 61, LDL 111    Goiter> palp thyroid, no change, and euthyroid w/ TSH=1.29...    GI> Divertics, Hems> on Metamucil;     DJD> on Glucosamine etc; she denies neck discomfort etc & still works out daily in [employer]...    Osteoporosis> on Calcium, VitD, MVI; BMD 5/13 showed severe osteoporosis w/ TScores -2.9 in Spine & -3.3 in Femur; also measured -5.2 in forearm/Radius> she was asked to start Reclast & show TP 5/13 but couldn't make a decision; now she tells me she has decided NOT to take Reclast or any other osteoporosis therapy; I carefully reviewed the serious consequences of her decision & offered her second opinions w/ GYN, Endocrine, Rheum, whatever but she declines; I further offered to explain to her son but she also declined this intervention...    Hx stroke & cerebral atrophy> on Plavix75; she is noting problems w/ her memory as she is having to write herself notes; offered Aricept vs Neuro but she declines new meds or referral...    Anxiety> on Xanax0.25 prn; she'l take 1/2 tab prn & notes that it helps... 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 eval by Neurology/ stroke team showed tiny right occipital infarct, diffuse intra /extracranial atherosclerotic dis (but no focal stenoses or interventionable lesions) & a part left 3rd nerve palsy as well... PLAVIX was added to her ASA therapy... ~  11/10:  f/u [name] stopped ASA, continued Plavix for her cerebrovasc dis... ~  11/10:  [facility]- he predicted good prognosis for spont recovery of left eye movement. ~  1/11:  her left 3rd nerve palsy has resolved...  ALLERGIC RHINITIS (ICD-477.8) - uses OTC antihistamines infrequently...  HYPERTENSION, MILD (ICD-401.1) - she has hx of HBP and on NORVASC 10mg /d...  ~  CXR 1/09 showed normal heart size, clear lungs, NAD... ~  12/12:  BP= 128/72 today and 130's/ 70's at home... denies HA, fatigue, CP, palipit, dizziness, syncope, dyspnea; min edema noted. ~  5-6/13:  BP= 134/70 but she is quite concerned about BPs up to 150-160 at home; we decided to add LOSARTAN 50mg /d to her meds & f/u in 6 weeks...  Hx of MITRAL VALVE PROLAPSE (ICD-424.0) - baseline EKG w/ NSR, WNL;  Neg stress thallium 4/93... she denies CP, palpit, SOB, etc... she exercises regularly walking 3-4 miles and going to the gym. ~  2DEcho 9/10 showed trivial prolapse of the post leaflet, norm LVF w/ EF= 65-70%, no regional wall motion abnormalities...  PERIPHERAL VASCULAR DISEASE (ICD-443.9) - on PLAVIX 75mg /d... CTAbd 8/06 showed thickening of Ao wall & prob plaque;  CDopplers 7/08 showed 0-39% bilat ICA stenoses & plaque in Rt subclavian art (plus Rt Thyroid Mass). ~  MRI/ MRA Brain & Neck 9/10 showed intra & extracranial atherosclerotic changes w/o interventionable lesions & no hemodynamically signif stenoses in the neck...  VENOUS  INSUFFICIENCY (ICD-459.81) - she had a vein stripping yrs ago> she follows a low sodium diet & has no edema at this time...  HYPERCHOLESTEROLEMIA, BORDERLINE (ICD-272.4) - on diet + FISH OIL, she does not want meds. ~  FLP 1/11 showed TChol 199, TG 109, HDL 66, LDL 112 ~  FLP 7/11 showed TChol 212, TG 66, HDL 69, LDL 133 ~  FLP 1/12 showed TChol 208, TG 106, HDL 66, LDL 124 ~  FLP 7/12 showed TChol 198, TG 47, HDL 75, LDL 114 ~  FLP 6/13 showed TChol 190, TG 88, HDL 61, LDL 111  GOITER, UNSPECIFIED (ICD-240.9) - Old CXR's show deviation of trachea to left by thyroid & CDoppler showed right thyroid mass... she has no local symptoms, swallowing difficulty & is clinically euthyroid...  ~  labs 6/09 showed TSH= 1.04 ~  labs 6/10 showed TSH= 0.98 ~  labs 7/11 showed TSH= 1.40 ~  labs 7/12 showed TSH= 1.30 ~  Labs 6/13 showed TSH= 1.29  DIVERTICULOSIS OF COLON (ICD-562.10) & HEMORRHOIDS (ICD-455.6) - Hx of very tortuous & redundant colon w/ severe sigmoid diverticulosis;  last colonoscopy w/ pediatric scope 10/98 was difficult & subseq barium enema showed severe divertics otherwise neg;  Rx fiber, anusol, etc...  CTAbd 1/03 w/ divertics otherwise neg;  she is reminded to take the MIRALAX/ Metamucil regularly.  UNSPECIFIED CYSTITIS (ICD-595.9) - Urology eval 8/09 by DrDalstadt...  OSTEOARTHRITIS (ICD-715.90) - DJD in both hips L>Rt w/ LTHR done 2/09 by DrAlusio... she takes Glucosamine, Calcium, MVI, VitC & VitD. ~  8/11: c/o neck pain & CSpine films by [name] showed DDD, 3mm anterolisthesis C4 on C5, osteopenia... ~  2/12:  c/o continued neck discomfort ?eval by neurosurg she said?, advised rest, heat, Tramadol Prn.   OSTEOPOROSIS (ICD-733.00) - BMD by DrNeal 7/02 showed TScores -2.3 to -3.2;  regular f/u BMD from DrNeal - last 8/08 w/ TScores -2.6 to -3.2;  Vit D level checked by GYN in past & was 53 here 6/09... ~  12/09:  she reports that she stopped her Fosamax after 12 yrs Rx per [facility] &  he  has rec Reclast but she is undecided & inclined to wait til next BMD and see if there has been any deterioration in measured bone density... ~  BMD 5/13:  Severe osteoporosis w/ TScores -2.9 in Spine & -3.3 in Femur; also measured -5.2 in forearm/Radius... rec to start Reclast yearly... ~  5/13:  She has declined Reclast & all other osteoporosis interventions offered to her; she has similarly declined referral for 2nd opinions etc...  CVA (ICD-434.91) - Hosp 9/10 w/ blurry vision & exam showing mild field cut & left eye strabismus... eval by Neurology/ stroke team showed tiny right occipital infarct, diffuse intra /extracranial atherosclerotic dis (but no focal stenoses or interventionable lesions) & a part left 3rd nerve palsy as well... PLAVIX was added to her ASA therapy... SEE DC SUMMARY  CEREBRAL ATROPHY (ICD-331.9) - CTBrain 10/07 showed mild atrophy, otherw neg... she denies memory problems- with minor changes on MMSE testing... 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 ~  6/10: when asked about her memory she states "it depends on what day it is" ~  1/11:  I see signs of mild deterioration in memory but she denies- eg. she couldn't recall what DrTMartin told her at [facility] about her 3rd nerve paresis, and she insisted that her mother lived to 42, then later corrected herself (she lived to 68). ~  2/12: similar signs of memory impairment w/ her c/o blurry vision & neck pain> she couldn't recall prev w/u by neuro or results.  ANXIETY (ICD-300.00) - she has severe stress having found her son at home after a suicide... she has ALPRAZOLAM for Prn use but doesn't take it... she was rec to try Zoloft per Neuro but she never filled it.   Past Surgical History  Procedure Date  . Breast lumpectomy 1960's    benign breast biopsy  . Cholecystectomy 1980    at [facility].  Marland Kitchen Appendectomy 1980    at [facility].  . Abdominal hysterectomy 07/1980    hyst and rectocele repair by Dr.  Dewaine Conger  . Oculoplastic eye surgery 12/1997    in [place]  . Vv stripping years ago     Outpatient Encounter Prescriptions as of 09/19/2011  Medication Sig Dispense Refill  . ALPRAZolam (XANAX) 0.25 MG tablet Take 1/2 to 1 tablet by mouth three times daily as needed for nerves       . amLODipine (NORVASC) 10 MG tablet Take 1 tablet (10 mg total) by mouth daily.  30 tablet  11  . Bioflavonoid Products (ESTER C PO) Take 1 tablet by mouth daily.        . calcium gluconate 500 MG tablet Take 500 mg by mouth daily.       . Cholecalciferol (VITAMIN D) 1000 UNITS capsule Take 1,000 Units by mouth daily.        . clopidogrel (PLAVIX) 75 MG tablet Take 1 tablet (75 mg total) by mouth daily.  30 tablet  11  . fish oil-omega-3 fatty acids 1000 MG capsule Take 2 g by mouth daily.        . Glucosamine HCl 1000 MG TABS Take 1 tablet by mouth daily.        Marland Kitchen losartan (COZAAR) 50 MG tablet Take 1 tablet (50 mg total) by mouth daily.  30 tablet  5  . Multiple Vitamin (MULTIVITAMIN PO) Take 1 tablet by mouth daily.        . Psyllium (METAMUCIL) 30.9 % POWD  1 tsp daily         Allergies  Allergen Reactions  . Amoxicillin-Pot Clavulanate     REACTION: "fuzzy-headed"  . Sulfamethoxazole W-Trimethoprim     REACTION: allergy to Sulfa w/ flu-like illness.    Review of Systems        See HPI - all other systems neg except as noted... The patient denies anorexia, fever, weight loss, weight gain, vision loss, decreased hearing, hoarseness, chest pain, syncope, dyspnea on exertion, peripheral edema, prolonged cough, headaches, hemoptysis, abdominal pain, melena, hematochezia, severe indigestion/heartburn, hematuria, incontinence, muscle weakness, suspicious skin lesions, transient blindness, difficulty walking, depression, unusual weight change, abnormal bleeding, enlarged lymph nodes, and angioedema.    Objective:   Physical Exam     WD,Thin, 78 y/o WF in NAD... she is moderately anxious today... GENERAL:   Alert & oriented; pleasant & cooperative... HEENT:  [place]/AT, EOM- full & WNL, EACs-clear, TMs-wnl, NOSE-pale w/ clear discharge, THROAT-clear & WNL. NECK:  Supple w/ fairROM; no JVD; normal carotid impulses w/o bruits; palp thyroid w/o change, no lymphadenopathy. CHEST:  Decr BS bilat, clear- no wheezes/ rales/ rhonchi... HEART:  Regular Rhythm; without murmurs/ rubs/ or gallops heard... ABDOMEN:  Soft & nontender; normal bowel sounds; no organomegaly or masses detected. EXT: without deformities, mild arthritic changes; no varicose veins/ +venous insuffic/ no edema. NEURO: CN's intact & no other neuro deficits... some decr ROM neck. SKIN:  neg- w/o lesions...  RADIOLOGY DATA:  Reviewed in the EPIC EMR & discussed w/ the patient...  LABORATORY DATA:  Reviewed in the EPIC EMR & discussed w/ the patient...   Assessment & Plan:   OPHTHALMOLOGY>  She was to f/u w/ new eye doctor at [facility], ?if she went, we don't have notes, she can't remember...  HBP>  She is quite concerned about her BP readings at home in the 150-160 range on Norvasc10mg /d which she assures me she takes regularly even though she didn't bring bottles to the OV (again); we decided to add Losartan 50mg /d & she will monitor BP at home & f/u in 6 weeks.Alfonso Ellis Vasc Dis/ Hx of STROKE>  On Plavix daily, no cerebral ischemic symptoms...  CHOL>  FLP has been OK on diet alone, keep up the good work...  GOITER>  Denies compressive symptoms or hyper/hypo symptoms; chemically euthyroid as well & following...  DIVERTICULOSIS>  Known severe sigm divertics & reminded to take Metamucil/ Miralax regularly... We discussed Simethacone for gas symptoms.  DJD>  She had Tramadol to use prn but prefers OTC meds- Tylenol/ Advil etc...  Osteoporosis>  She gets BMDs from DrNeal, Gyn & was prev on Fosamax; this was stopped after 10+ yrs rx & DrNeal has rec reclast but she is holding off...  MEMORY LOSS>  She has some atrophy on scans  & minor changes on prev MMSE; she has declined Aricept & prev tried OTC memory supplements.  ANXIETY>  As noted she feels that the Alpraz caused trouble thinking but does not want substitute medication for nerves...   Patient's Medications  New Prescriptions   No medications on file  Previous Medications   ALPRAZOLAM (XANAX) 0.25 MG TABLET    Take 1/2 to 1 tablet by mouth three times daily as needed for nerves    AMLODIPINE (NORVASC) 10 MG TABLET    Take 1 tablet (10 mg total) by mouth daily.   BIOFLAVONOID PRODUCTS (ESTER C PO)    Take 1 tablet by mouth daily.     CALCIUM GLUCONATE  500 MG TABLET    Take 500 mg by mouth daily.    CHOLECALCIFEROL (VITAMIN D) 1000 UNITS CAPSULE    Take 1,000 Units by mouth daily.     CLOPIDOGREL (PLAVIX) 75 MG TABLET    Take 1 tablet (75 mg total) by mouth daily.   FISH OIL-OMEGA-3 FATTY ACIDS 1000 MG CAPSULE    Take 2 g by mouth daily.     GLUCOSAMINE HCL 1000 MG TABS    Take 1 tablet by mouth daily.     LOSARTAN (COZAAR) 50 MG TABLET    Take 1 tablet (50 mg total) by mouth daily.   MULTIPLE VITAMIN (MULTIVITAMIN PO)    Take 1 tablet by mouth daily.     PSYLLIUM (METAMUCIL) 30.9 % POWD    1 tsp daily   Modified Medications   No medications on file  Discontinued Medications   No medications on file

## 2011-10-03 ENCOUNTER — Telehealth: Payer: Self-pay | Admitting: Pulmonary Disease

## 2011-10-03 NOTE — Telephone Encounter (Signed)
I spoke with pt and she states she does not feel this is what she needs to do and does not feel like she needs to see [facility]. She states she believes the confusion is coming from "the problem she is having"--wasn't clear on what problem. She thinks she may have a blood clot in her lungs and this is causing the confusion.. She states she is going to refill her plavix today since she ran out yesterday. Pt asked I get this resent to SN. Please advise Dr. Kriste Basque, thanks

## 2011-10-03 NOTE — Telephone Encounter (Signed)
Called spoke with patient who describes some nausea, and her "eyes hurt" x2-3days.  Pt also reports having "to think about normal routines."  For example (given by patient), she has to think about what roads she needs to take to get to a specific place.  Pt stated that she has noticed this x3-4 days but has been more pronounced x2days.  Denies any facial drooping, slurred speech, difficulty swallowing, changes in vision.  BP was 135/62, HR 80 while she was on the phone, and pt stated it has been running in this same range since symptoms began.    Pt requesting to be seen today.  TP not in office.  Dr Kriste Basque please advise, thanks.  Allergies  Allergen Reactions  . Amoxicillin-Pot Clavulanate     REACTION: "fuzzy-headed"  . Sulfamethoxazole W-Trimethoprim     REACTION: allergy to Sulfa w/ flu-like illness.

## 2011-10-03 NOTE — Telephone Encounter (Signed)
Per SN---we have no openings---for nausea call in zofran 4mg   #20   1 tab po every 6 hours prn nausea. And the memory will need to be followed up with [facility].

## 2011-10-03 NOTE — Telephone Encounter (Signed)
Per SN---she was seen by SN on 09/19/2011 and she had no signs of blood clots---blood clots in the lungs do not cause nausea and confusion.  thanks

## 2011-10-03 NOTE — Telephone Encounter (Signed)
I spoke with pt and is aware of SN response. She voiced her understanding and needed nothing further

## 2011-10-05 ENCOUNTER — Telehealth: Payer: Self-pay | Admitting: Pulmonary Disease

## 2011-10-05 MED ORDER — ONDANSETRON HCL 4 MG PO TABS: 4.0000 mg | ORAL_TABLET | Freq: Four times a day (QID) | ORAL | Status: AC | PRN

## 2011-10-05 NOTE — Telephone Encounter (Signed)
Called and spoke with pt and she stated that she is still having the nausea. Explained to her that from the last phone note---SN recs were for her to take the zofran 4 mg  1 po every 6 hours prn nausea.  Pt stated that she did not have this med and looks like this was never called in for her.  i have sent this to her pharmacy and pt is aware.  She did call and set up appt with neurology for the memory issues.  Pt is aware to call back for further problems.

## 2011-10-20 ENCOUNTER — Telehealth: Payer: Self-pay | Admitting: Pulmonary Disease

## 2011-10-20 NOTE — Telephone Encounter (Signed)
I spoke with pt and made her aware of this. Advised her to keep taking this until SN returns on Monday. She voiced her understanding and will forward to SN for when he returns. Please advise SN thanks

## 2011-10-20 NOTE — Telephone Encounter (Signed)
Spoke with pt. She states that she has been taking plavix for approx 2 months now and med makes her feel confused and "disoriented" and also causes her to feel pressure behind her eyes. She states that she did not notice these symptoms until she started taking med and would like a replacement. Will forward to doc of the day since SN is off. Please advise, thanks! Allergies  Allergen Reactions  . Amoxicillin-Pot Clavulanate     REACTION: "fuzzy-headed"  . Sulfamethoxazole W-Trimethoprim     REACTION: allergy to Sulfa w/ flu-like illness.

## 2011-10-20 NOTE — Telephone Encounter (Signed)
This issue will have to wait until she can call back and speak to Dr Kriste Basque on Monday.

## 2011-10-21 NOTE — Telephone Encounter (Signed)
Pt is aware of Sn recs and verbalized understanding.

## 2011-10-21 NOTE — Telephone Encounter (Signed)
Per SN: recommend stopping the plavix if she is intolerant.  There is no alternative.  Just use her 81mg  asa daily.

## 2011-11-03 ENCOUNTER — Telehealth: Payer: Self-pay | Admitting: Pulmonary Disease

## 2011-11-03 NOTE — Telephone Encounter (Signed)
Called and spoke with pt and she stated that her eyes were blurry this morning when she got up and she has used some eye drop and walked outside and this has helped.  Pt also c/o nausea x 3 days and if she drinks some coke this does help some with settling her stomach.  Pt requesting recs from SN.      Pt also stated that she has not been taking the plavix and asked if she should be on this---i explained to the pt from the July phone note--- Pt Per SN: recommend stopping the plavix if she is intolerant. There is no alternative. Just use her 81mg  asa daily. Pt stated that she has been taking the 81 mg aspirin daily.  Nothing further needed on this.

## 2011-11-03 NOTE — Telephone Encounter (Signed)
Pt called back again. She asks that dr Kriste Basque "go ahead and call in rx for nausea" - [facility] [address]. Ms.

## 2011-11-03 NOTE — Telephone Encounter (Signed)
Patient calling back stating she found old rx, so she is not needing rx for nausea.  Nothing else needed.

## 2011-11-17 DIAGNOSIS — F411 Generalized anxiety disorder: Secondary | ICD-10-CM | POA: Diagnosis not present

## 2011-11-17 DIAGNOSIS — I6789 Other cerebrovascular disease: Secondary | ICD-10-CM | POA: Diagnosis not present

## 2011-11-17 DIAGNOSIS — F070 Personality change due to known physiological condition: Secondary | ICD-10-CM | POA: Diagnosis not present

## 2011-11-17 DIAGNOSIS — I63219 Cerebral infarction due to unspecified occlusion or stenosis of unspecified vertebral arteries: Secondary | ICD-10-CM | POA: Diagnosis not present

## 2011-11-21 ENCOUNTER — Other Ambulatory Visit: Payer: Self-pay | Admitting: Neurology

## 2011-11-21 DIAGNOSIS — I6789 Other cerebrovascular disease: Secondary | ICD-10-CM

## 2011-11-21 DIAGNOSIS — G3184 Mild cognitive impairment, so stated: Secondary | ICD-10-CM

## 2011-11-22 DIAGNOSIS — H2510 Age-related nuclear cataract, unspecified eye: Secondary | ICD-10-CM | POA: Diagnosis not present

## 2011-12-01 ENCOUNTER — Ambulatory Visit
Admission: RE | Admit: 2011-12-01 | Discharge: 2011-12-01 | Disposition: A | Discharge status: Home / Self Care | Payer: Medicare Other | Source: Ambulatory Visit | Attending: Neurology | Admitting: Neurology

## 2011-12-01 DIAGNOSIS — I6789 Other cerebrovascular disease: Secondary | ICD-10-CM

## 2011-12-01 DIAGNOSIS — G3184 Mild cognitive impairment, so stated: Secondary | ICD-10-CM

## 2011-12-02 ENCOUNTER — Other Ambulatory Visit (INDEPENDENT_AMBULATORY_CARE_PROVIDER_SITE_OTHER): Payer: Medicare Other

## 2011-12-02 ENCOUNTER — Encounter: Payer: Self-pay | Admitting: Adult Health

## 2011-12-02 ENCOUNTER — Ambulatory Visit (INDEPENDENT_AMBULATORY_CARE_PROVIDER_SITE_OTHER): Payer: Medicare Other | Admitting: Adult Health

## 2011-12-02 VITALS — BP 144/60 | HR 85 | Temp 97.9°F | Ht 61.0 in | Wt 106.8 lb

## 2011-12-02 DIAGNOSIS — R413 Other amnesia: Secondary | ICD-10-CM | POA: Diagnosis not present

## 2011-12-02 LAB — CBC WITH DIFFERENTIAL/PLATELET
Basophils Absolute: 0 10*3/uL (ref 0.0–0.1)
Eosinophils Absolute: 0.1 10*3/uL (ref 0.0–0.7)
HCT: 40.8 % (ref 36.0–46.0)
Lymphs Abs: 1.3 10*3/uL (ref 0.7–4.0)
MCHC: 32.8 g/dL (ref 30.0–36.0)
MCV: 98.6 fl (ref 78.0–100.0)
Monocytes Absolute: 0.7 10*3/uL (ref 0.1–1.0)
Monocytes Relative: 9.6 % (ref 3.0–12.0)
Platelets: 255 10*3/uL (ref 150.0–400.0)
RDW: 13.3 % (ref 11.5–14.6)

## 2011-12-02 LAB — BASIC METABOLIC PANEL
BUN: 22 mg/dL (ref 6–23)
GFR: 72.34 mL/min (ref >60.00)
Glucose, Bld: 125 mg/dL — ABNORMAL HIGH (ref 70–99)
Potassium: 4.6 mEq/L (ref 3.5–5.1)

## 2011-12-02 LAB — B12 AND FOLATE PANEL: Vitamin B-12: 857 pg/mL (ref 211–911)

## 2011-12-02 MED ORDER — CLOPIDOGREL BISULFATE 75 MG PO TABS: 75.0000 mg | ORAL_TABLET | Freq: Every day | ORAL | Status: DC

## 2011-12-02 NOTE — Patient Instructions (Addendum)
Restart Plavix 75mg  daily  I will call with lab results.  Follow up back with Dr. Pearlean Brownie as planned  follow up Dr. Kriste Basque  As planned and As needed

## 2011-12-02 NOTE — Assessment & Plan Note (Signed)
Memory impairment suspect this is progressive and now w/ recent neuro evaluation with  MMSE 24/30 - Discussed adding Aricept , pt declines Will check labs with B12 level.  MRI 9/5 w/ no acute process Suspect blurred vision is chronic eye conditions with cataracts.  However suggested recheck with opthalomology if not improving.   Plan;  Restart Plavix 75mg  daily for maximum protection  I will call with lab results.  Follow up back with Dr. Pearlean Brownie as planned  follow up Dr. Kriste Basque  As planned and As needed

## 2011-12-02 NOTE — Addendum Note (Signed)
Addended by: Boone Master E on: 12/02/2011 05:03 PM   Modules accepted: Orders

## 2011-12-02 NOTE — Progress Notes (Signed)
Subjective:    Patient ID: [name], female    DOB: [birth year], 78 y.o.   MRN: [id number]  HPI 78 y/o WF he has multiple medical problems as noted below...  Followed for general medical purposes w/ hx of HBP, mild MVP, ASPVD, Goiter, Divertics, DJD w/ left THR, Osteoporosis, hx right occipital stoke & 3rd N paresis, Memory loss, & mod severe anxiety...  ~  April 26, 2010:  6 month ROV- stable overall but she stopped the Plavix on her own (due to some minor bruising) & we discussed restarting this medication due to prev stroke & risk of recurrence.    She has neck pain & saw Neurosurg (?we don't have notes) & told it was a bone spur & might need surg; for now heating pad, Tylenol, phys therapy; she exercises at the gym regularly...    BP controlled on Norvasc;  denies CP, palpit, SOB, edema;  FLP is OK on diet alone, & she declines low dose statin med;  she seems to be managing reasonably well...  ~  May 18, 2010:  Add-on for blurred vision & left neck pain w/ incr anxiety evident... states she's worried about her heart w/ these symptoms (known mild MVP, no known CAD) & EKG today is NSR, essent WNL.Marland Kitchen.  she equates the blurred vision to her stroke 8/10 but she had dysconjugate gaze & 3rd nerve paresis at that time & EOM are normal now... all this hx reviewed w/ the pt & excellent Neuro note from [facility] 9/11 also reviewed w/ her including MRI/ MRA (neg- no acute process), CSpine films (signif DDD & anterolisthesis C4 on C5), etc...  she was prev rec to take Zoloft but she declined, & keeps on perseverating her concerns over heart & stroke>  we decided to treat her neck symptoms w/ rest, heat, Tramadol Prn, and rec f/u neuro check by DrSethi etal (time for 90mo rov)... she would like Ophthalmology check for the blurring of vision but doesn't want to see DrShapiro again> we will refer to [facility] ==> (we don't have notes from DrSethi or Ophthalmology).  ~  September 07, 2010:  66mo ROV  & she relates a stressful "situation" at the [facility] regarding a hug from another member- very upsetting to her, she's been using the Alprazolam but she feels this caused trouble w/ her thinking & she wants to use PROCERA AVH supplement to help w/ her memory;  she notes "pressure" behind eyes, HAs, unable to relax> doesn't want meds & notes "if I get my nerves straightened out I'll be OK"...  ~  October 25, 2010:  6wk ROV & due for fasting blood work today... She never tried the [facility] but feels better w/ decr stress; BP controlled on Amlodipine which she remembers to take 4-5d per week she says; she is better about taking the Plavix (daily) & denies cerebral ischemic symptoms; she notes some left arm aching (works out every day) but states it resolves after 2 cups coffee... Requesting rx for shingles vaccine.  ~  February 28, 2011:  66mo ROV & she is doing well overall just c/o some gas & we discussed Simethacone preps for this- Mylicon, GasX, Phazyme, etc...  BP well controlled on Amlodip & she denies CP, palpit, SOB, edema; she continues to exercise regularly at the gym; she remains on Plavix & denies cerebral ischemic symptoms; we reviewed labs from 7/12 and FLP ok on her diet + FishOil and Thyroid remains wnl; hx DJD w/ prev left  THR & she uses OTC analgesics as needed; she is still not using the Alpraz anxiolytic...  She had the Shingles vaccine & the Flu shot this yr; Plavix & Amlodip refilled per request...  ~  Jul 29, 2011:  24mo ROV & [name] is added-on for mult symptoms> states BP has been up (readings 150-160 at home) & she is very concerned "I can feel it when it's up- I'm more anxious, not as calm" she will take 1/4th of an Alpraz0.5mg  tab & this helps she says;  Also c/o eyes feel strained & left arm/ shoulder discomfort- but it's not too bad & she refuses Ortho eval... We decided to add LOSARTAN 50mg /d to her regimen & recheck her BP in about 6 weeks... We reviewed her prob list, meds, Xrays &  labs>> BMD 5/13:  Severe osteoporosis w/ TScores -2.9 in Spine & -3.3 in Femur; also measured -5.2 in forearm/Radius... rec to start Reclast yearly...  ~  September 19, 2011:  6wk ROV & [name] notes some trouble w/ her memory (see below) and is here to review the need for Reclast to treat her osteoporosis (see below);  Oddly enough- while she refuses interventions for these important medical issues, her CC today is a minor discoloration of her nails thast concerns her & she is referred to [facility] for this analysis...    HBP> on Norvasc10, Cozaar50;  BP= 142/72 & she denies CP, palpit, SOB, edema...    MVP> she denies CP, palpit, and exercises daily at he gym...    ASPVD> on Plavix75; she denies cerebral ischemic symptoms...    Chol> on FishOil; FLP looks ok showing TChol 190, TG 88, HDL 61, LDL 111    Goiter> palp thyroid, no change, and euthyroid w/ TSH=1.29...    GI> Divertics, Hems> on Metamucil;     DJD> on Glucosamine etc; she denies neck discomfort etc & still works out daily in [employer]...    Osteoporosis> on Calcium, VitD, MVI; BMD 5/13 showed severe osteoporosis w/ TScores -2.9 in Spine & -3.3 in Femur; also measured -5.2 in forearm/Radius> she was asked to start Reclast & show TP 5/13 but couldn't make a decision; now she tells me she has decided NOT to take Reclast or any other osteoporosis therapy; I carefully reviewed the serious consequences of her decision & offered her second opinions w/ GYN, Endocrine, Rheum, whatever but she declines; I further offered to explain to her son but she also declined this intervention...    Hx stroke & cerebral atrophy> on Plavix75; she is noting problems w/ her memory as she is having to write herself notes; offered Aricept vs Neuro but she declines new meds or referral...    Anxiety> on Xanax0.25 prn; she'l take 1/2 tab prn & notes that it helps... We reviewed prob list, meds, xrays and labs> see below>> LABS 6/13:  FLP- at goals x LDL=111;  Chems- ok;  CBC- ok;   TSH=1.29;  VitD=50   12/02/2011 Acute OV  Complains of "not thinking clearly", blurred vision for 3 months , getting worse.   Symptoms have been on/off for last 2-3 months .  Seen by [facility] Neuro last week for same complaints.  Had MRI yesterday showed mild changes of chronic microvascular ischemia an dgeneralized cerbral atrophy. No change from 10/2009.  MMSE notes from neuro was 24/30.  Seen by eye doctor 2 weeks ago, with notable cataracts.  She has been off plavix for 3 months-'stopped due to nausea and felt it was  causing confusion"  Per chart review memory impairment has been an ongoing problem -although mild for few years -notable after stroke and loss of her son -(died from suicide)  We discussed restarting plavix for maximum protection she is agreeable.  Complains of discoloration of nails for last 6 months  No visual loss . No chest pain or edema. No ext weakness.  We discussed memory loss and consideration of Aricept -she refuses.     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 eval by Neurology/ stroke team showed tiny right occipital infarct, diffuse intra /extracranial atherosclerotic dis (but no focal stenoses or interventionable lesions) & a part left 3rd nerve palsy as well... PLAVIX was added to her ASA therapy... ~  11/10:  f/u DrSethi stopped ASA, continued Plavix for her cerebrovasc dis... ~  11/10:  [facility]- he predicted good prognosis for spont recovery of left eye movement. ~  1/11:  her left 3rd nerve palsy has resolved...  ALLERGIC RHINITIS (ICD-477.8) - uses OTC antihistamines infrequently...  HYPERTENSION, MILD (ICD-401.1) - she has hx of HBP and on NORVASC 10mg /d...  ~  CXR 1/09 showed normal heart size, clear lungs, NAD... ~  12/12:  BP= 128/72 today and 130's/ 70's at home... denies HA, fatigue, CP, palipit,  dizziness, syncope, dyspnea; min edema noted. ~  5-6/13:  BP= 134/70 but she is quite concerned about BPs up to 150-160 at home; we decided to add LOSARTAN 50mg /d to her meds & f/u in 6 weeks...  Hx of MITRAL VALVE PROLAPSE (ICD-424.0) - baseline EKG w/ NSR, WNL;  Neg stress thallium 4/93... she denies CP, palpit, SOB, etc... she exercises regularly walking 3-4 miles and going to the gym. ~  2DEcho 9/10 showed trivial prolapse of the post leaflet, norm LVF w/ EF= 65-70%, no regional wall motion abnormalities...  PERIPHERAL VASCULAR DISEASE (ICD-443.9) - on PLAVIX 75mg /d... CTAbd 8/06 showed thickening of Ao wall & prob plaque;  CDopplers 7/08 showed 0-39% bilat ICA stenoses & plaque in Rt subclavian art (plus Rt Thyroid Mass). ~  MRI/ MRA Brain & Neck 9/10 showed intra & extracranial atherosclerotic changes w/o interventionable lesions & no hemodynamically signif stenoses in the neck...  VENOUS INSUFFICIENCY (ICD-459.81) - she had a vein stripping yrs ago> she follows a low sodium diet & has no edema at this time...  HYPERCHOLESTEROLEMIA, BORDERLINE (ICD-272.4) - on diet + FISH OIL, she does not want meds. ~  FLP 1/11 showed TChol 199, TG 109, HDL 66, LDL 112 ~  FLP 7/11 showed TChol 212, TG 66, HDL 69, LDL 133 ~  FLP 1/12 showed TChol 208, TG 106, HDL 66, LDL 124 ~  FLP 7/12 showed TChol 198, TG 47, HDL 75, LDL 114 ~  FLP 6/13 showed TChol 190, TG 88, HDL 61, LDL 111  GOITER, UNSPECIFIED (ICD-240.9) - Old CXR's show deviation of trachea to left by thyroid & CDoppler showed right thyroid mass... she has no local symptoms, swallowing difficulty & is clinically euthyroid...  ~  labs 6/09 showed TSH= 1.04 ~  labs 6/10 showed TSH= 0.98 ~  labs 7/11 showed TSH= 1.40 ~  labs 7/12 showed TSH= 1.30 ~  Labs 6/13 showed TSH= 1.29  DIVERTICULOSIS OF COLON (ICD-562.10) & HEMORRHOIDS (ICD-455.6) - Hx of very tortuous & redundant colon w/ severe sigmoid diverticulosis;  last colonoscopy w/ pediatric  scope  10/98 was difficult & subseq barium enema showed severe divertics otherwise neg;  Rx fiber, anusol, etc...  CTAbd 1/03 w/ divertics otherwise neg;  she is reminded to take the MIRALAX/ Metamucil regularly.  UNSPECIFIED CYSTITIS (ICD-595.9) - Urology eval 8/09 by DrDalstadt...  OSTEOARTHRITIS (ICD-715.90) - DJD in both hips L>Rt w/ LTHR done 2/09 by DrAlusio... she takes Glucosamine, Calcium, MVI, VitC & VitD. ~  8/11: c/o neck pain & CSpine films by DrSethi showed DDD, 3mm anterolisthesis C4 on C5, osteopenia... ~  2/12:  c/o continued neck discomfort ?eval by neurosurg she said?, advised rest, heat, Tramadol Prn.   OSTEOPOROSIS (ICD-733.00) - BMD by DrNeal 7/02 showed TScores -2.3 to -3.2;  regular f/u BMD from DrNeal - last 8/08 w/ TScores -2.6 to -3.2;  Vit D level checked by GYN in past & was 53 here 6/09... ~  12/09:  she reports that she stopped her Fosamax after 12 yrs Rx per DrNeal & he has rec Reclast but she is undecided & inclined to wait til next BMD and see if there has been any deterioration in measured bone density... ~  BMD 5/13:  Severe osteoporosis w/ TScores -2.9 in Spine & -3.3 in Femur; also measured -5.2 in forearm/Radius... rec to start Reclast yearly... ~  5/13:  She has declined Reclast & all other osteoporosis interventions offered to her; she has similarly declined referral for 2nd opinions etc...  CVA (ICD-434.91) - Hosp 9/10 w/ blurry vision & exam showing mild field cut & left eye strabismus... eval by Neurology/ stroke team showed tiny right occipital infarct, diffuse intra /extracranial atherosclerotic dis (but no focal stenoses or interventionable lesions) & a part left 3rd nerve palsy as well... PLAVIX was added to her ASA therapy... SEE DC SUMMARY  CEREBRAL ATROPHY (ICD-331.9) - CTBrain 10/07 showed mild atrophy, otherw neg... she denies memory problems- with minor changes on MMSE testing... 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 ~  6/10:  when asked about her memory she states "it depends on what day it is" ~  1/11:  I see signs of mild deterioration in memory but she denies- eg. she couldn't recall what DrTMartin told her at [facility] about her 3rd nerve paresis, and she insisted that her mother lived to 48, then later corrected herself (she lived to 37). ~  2/12: similar signs of memory impairment w/ her c/o blurry vision & neck pain> she couldn't recall prev w/u by neuro or results.  ANXIETY (ICD-300.00) - she has severe stress having found her son at home after a suicide... she has ALPRAZOLAM for Prn use but doesn't take it... she was rec to try Zoloft per Neuro but she never filled it.   Past Surgical History  Procedure Date  . Breast lumpectomy 1960's    benign breast biopsy  . Cholecystectomy 1980    at [facility]  Marland Kitchen Appendectomy 1980    at [facility].  . Abdominal hysterectomy 07/1980    hyst and rectocele repair by Dr. Dewaine Conger  . Oculoplastic eye surgery 12/1997    in [place]  . Vv stripping years ago     Outpatient Encounter Prescriptions as of 12/02/2011  Medication Sig Dispense Refill  . ALPRAZolam (XANAX) 0.25 MG tablet Take 1/2 to 1 tablet by mouth three times daily as needed for nerves       . amLODipine (NORVASC) 10 MG tablet Take 1 tablet (10 mg total) by mouth daily.  30 tablet  11  . Bioflavonoid Products (ESTER C PO) Take 1 tablet by mouth daily.        . calcium gluconate 500 MG tablet Take 500 mg by mouth daily.       . Cholecalciferol (VITAMIN D) 1000 UNITS capsule Take 1,000 Units by mouth daily.        . fish oil-omega-3 fatty acids 1000 MG capsule Take 2 g by mouth daily.        . Glucosamine HCl 1000 MG TABS Take 1 tablet by mouth daily.        Marland Kitchen losartan (COZAAR) 50 MG tablet Take 1 tablet (50 mg total) by mouth daily.  30 tablet  5  . Multiple Vitamin (MULTIVITAMIN PO) Take 1 tablet by mouth daily.        . Psyllium (METAMUCIL) 30.9 % POWD 1 tsp daily         Allergies  Allergen Reactions  .  Amoxicillin-Pot Clavulanate     REACTION: "fuzzy-headed"  . Sulfamethoxazole W-Trimethoprim     REACTION: allergy to Sulfa w/ flu-like illness.    Review of Systems        Constitutional:   No  weight loss, night sweats,  Fevers, chills, fatigue, or  lassitude.  HEENT:   No headaches,  Difficulty swallowing,  Tooth/dental problems, or  Sore throat,                No sneezing, itching, ear ache, nasal congestion, post nasal drip,   CV:  No chest pain,  Orthopnea, PND, swelling in lower extremities, anasarca, dizziness, palpitations, syncope.   GI  No heartburn, indigestion, abdominal pain, nausea, vomiting, diarrhea, change in bowel habits, loss of appetite, bloody stools.   Resp: No shortness of breath with exertion or at rest.  No excess mucus, no productive cough,  No non-productive cough,  No coughing up of blood.  No change in color of mucus.  No wheezing.  No chest wall deformity  Skin: no rash or lesions.  GU: no dysuria, change in color of urine, no urgency or frequency.  No flank pain, no hematuria   MS:  No joint pain or swelling.  No decreased range of motion.  No back pain.  Psych:  No change in mood or affect. No depression or anxiety.  No memory loss.       Objective:   Physical Exam     WD,Thin, 78 y/o WF in NAD... she is moderately anxious today... GENERAL:  Alert & oriented; pleasant & cooperative... HEENT:  [place]/AT, EOM- full & WNL, EACs-clear, TMs-wnl, NOSE-pale    THROAT-clear & WNL. NECK:  Supple w/ fairROM; no JVD; normal carotid impulses w/o bruits; palp thyroid , no lymphadenopathy. CHEST:  Decr BS bilat, clear- no wheezes/ rales/ rhonchi... HEART:  Regular Rhythm; without murmurs/ rubs/ or gallops heard... ABDOMEN:  Soft & nontender; normal bowel sounds; no organomegaly or masses detected. EXT: without deformities, mild arthritic changes; no varicose veins/ +venous insuffic/ no edema. NEURO: CN's intact & no other neuro deficits.nml grips. nml gait    Confused to recent appointments with neuro and eye doctor. Goes thrus her note cards often and appears to get disorganized and confused easily .  Confused to date  SKIN:  neg- w/o lesions...    Assessment & Plan:

## 2011-12-12 ENCOUNTER — Ambulatory Visit (INDEPENDENT_AMBULATORY_CARE_PROVIDER_SITE_OTHER): Payer: Medicare Other | Admitting: Adult Health

## 2011-12-12 ENCOUNTER — Encounter: Payer: Self-pay | Admitting: Adult Health

## 2011-12-12 VITALS — BP 118/78 | HR 71 | Temp 97.8°F | Ht 62.0 in | Wt 105.4 lb

## 2011-12-12 DIAGNOSIS — H6120 Impacted cerumen, unspecified ear: Secondary | ICD-10-CM

## 2011-12-12 NOTE — Patient Instructions (Addendum)
May use Debrox or Peroxide daily to loosen wax As needed   Return for ear wash later this week if not resolved.  Please contact office for sooner follow up if symptoms do not improve or worsen or seek emergency care

## 2011-12-15 ENCOUNTER — Ambulatory Visit (INDEPENDENT_AMBULATORY_CARE_PROVIDER_SITE_OTHER): Payer: Medicare Other | Admitting: Adult Health

## 2011-12-15 ENCOUNTER — Encounter: Payer: Self-pay | Admitting: Adult Health

## 2011-12-15 DIAGNOSIS — H6120 Impacted cerumen, unspecified ear: Secondary | ICD-10-CM | POA: Diagnosis not present

## 2011-12-15 NOTE — Progress Notes (Signed)
  Subjective:    Patient ID: [name], female    DOB: 25-Jun-1924, 78 y.o.   MRN: [id number]  HPI 78 y/o WF he has multiple medical problems as noted below...  Followed for general medical purposes w/ hx of HBP, mild MVP, ASPVD, Goiter, Divertics, DJD w/ left THR, Osteoporosis, hx right occipital stoke & 3rd N paresis, Memory loss, & mod severe anxiety...  12/15/2011 Follow up  Returns for stopped ear on left  Seen few days ago for wax impaction on left , removed partial with ear irrigation  No pain or drainage  Has been using debrox drops otc .     Review of Systems        Constitutional:   No  weight loss, night sweats,  Fevers, chills, fatigue, or  lassitude.  HEENT:   No headaches,  Difficulty swallowing,  Tooth/dental problems, or  Sore throat,                No sneezing, itching, ear ache, nasal congestion, post nasal drip,         Objective:   Physical Exam     WD,Thin, 78 y/o WF in NAD GENERAL:  Alert & oriented; pleasant & cooperative... HEENT:  [place]/AT,    Left wax impaction      Assessment & Plan:

## 2011-12-15 NOTE — Assessment & Plan Note (Signed)
Ear irrigation w/ wax extraction,  Tolerated well , EAC clear w/ nml TM   Plan  Debrox As needed   Flu shot  Please contact office for sooner follow up if symptoms do not improve or worsen

## 2011-12-15 NOTE — Patient Instructions (Addendum)
Flu shot today  May use Debrox As needed   Please contact office for sooner follow up if symptoms do not improve or worsen or seek emergency care

## 2011-12-15 NOTE — Progress Notes (Signed)
Subjective:    Patient ID: [name], female    DOB: [birth year], 78 y.o.   MRN: [id number]  HPI 78 y/o WF he has multiple medical problems as noted below...  Followed for general medical purposes w/ hx of HBP, mild MVP, ASPVD, Goiter, Divertics, DJD w/ left THR, Osteoporosis, hx right occipital stoke & 3rd N paresis, Memory loss, & mod severe anxiety...  ~  April 26, 2010:  6 month ROV- stable overall but she stopped the Plavix on her own (due to some minor bruising) & we discussed restarting this medication due to prev stroke & risk of recurrence.    She has neck pain & saw Neurosurg (?we don't have notes) & told it was a bone spur & might need surg; for now heating pad, Tylenol, phys therapy; she exercises at the gym regularly...    BP controlled on Norvasc;  denies CP, palpit, SOB, edema;  FLP is OK on diet alone, & she declines low dose statin med;  she seems to be managing reasonably well...  ~  May 18, 2010:  Add-on for blurred vision & left neck pain w/ incr anxiety evident... states she's worried about her heart w/ these symptoms (known mild MVP, no known CAD) & EKG today is NSR, essent WNL.Marland Kitchen.  she equates the blurred vision to her stroke 8/10 but she had dysconjugate gaze & 3rd nerve paresis at that time & EOM are normal now... all this hx reviewed w/ the pt & excellent Neuro note from [facility] 9/11 also reviewed w/ her including MRI/ MRA (neg- no acute process), CSpine films (signif DDD & anterolisthesis C4 on C5), etc...  she was prev rec to take Zoloft but she declined, & keeps on perseverating her concerns over heart & stroke>  we decided to treat her neck symptoms w/ rest, heat, Tramadol Prn, and rec f/u neuro check by DrSethi etal (time for 90mo rov)... she would like Ophthalmology check for the blurring of vision but doesn't want to see DrShapiro again> we will refer to [facility] ==> (we don't have notes from DrSethi or Ophthalmology).  ~  September 07, 2010:  66mo ROV  & she relates a stressful "situation" at the [facility] regarding a hug from another member- very upsetting to her, she's been using the Alprazolam but she feels this caused trouble w/ her thinking & she wants to use PROCERA AVH supplement to help w/ her memory;  she notes "pressure" behind eyes, HAs, unable to relax> doesn't want meds & notes "if I get my nerves straightened out I'll be OK"...  ~  October 25, 2010:  6wk ROV & due for fasting blood work today... She never tried the [facility] but feels better w/ decr stress; BP controlled on Amlodipine which she remembers to take 4-5d per week she says; she is better about taking the Plavix (daily) & denies cerebral ischemic symptoms; she notes some left arm aching (works out every day) but states it resolves after 2 cups coffee... Requesting rx for shingles vaccine.  ~  February 28, 2011:  66mo ROV & she is doing well overall just c/o some gas & we discussed Simethacone preps for this- Mylicon, GasX, Phazyme, etc...  BP well controlled on Amlodip & she denies CP, palpit, SOB, edema; she continues to exercise regularly at the gym; she remains on Plavix & denies cerebral ischemic symptoms; we reviewed labs from 7/12 and FLP ok on her diet + FishOil and Thyroid remains wnl; hx DJD w/ prev left  THR & she uses OTC analgesics as needed; she is still not using the Alpraz anxiolytic...  She had the Shingles vaccine & the Flu shot this yr; Plavix & Amlodip refilled per request...  ~  Jul 29, 2011:  24mo ROV & [name] is added-on for mult symptoms> states BP has been up (readings 150-160 at home) & she is very concerned "I can feel it when it's up- I'm more anxious, not as calm" she will take 1/4th of an Alpraz0.5mg  tab & this helps she says;  Also c/o eyes feel strained & left arm/ shoulder discomfort- but it's not too bad & she refuses Ortho eval... We decided to add LOSARTAN 50mg /d to her regimen & recheck her BP in about 6 weeks... We reviewed her prob list, meds, Xrays &  labs>> BMD 5/13:  Severe osteoporosis w/ TScores -2.9 in Spine & -3.3 in Femur; also measured -5.2 in forearm/Radius... rec to start Reclast yearly...  ~  September 19, 2011:  6wk ROV & [name] notes some trouble w/ her memory (see below) and is here to review the need for Reclast to treat her osteoporosis (see below);  Oddly enough- while she refuses interventions for these important medical issues, her CC today is a minor discoloration of her nails thast concerns her & she is referred to [facility] for this analysis...    HBP> on Norvasc10, Cozaar50;  BP= 142/72 & she denies CP, palpit, SOB, edema...    MVP> she denies CP, palpit, and exercises daily at he gym...    ASPVD> on Plavix75; she denies cerebral ischemic symptoms...    Chol> on FishOil; FLP looks ok showing TChol 190, TG 88, HDL 61, LDL 111    Goiter> palp thyroid, no change, and euthyroid w/ TSH=1.29...    GI> Divertics, Hems> on Metamucil;     DJD> on Glucosamine etc; she denies neck discomfort etc & still works out daily in [employer]...    Osteoporosis> on Calcium, VitD, MVI; BMD 5/13 showed severe osteoporosis w/ TScores -2.9 in Spine & -3.3 in Femur; also measured -5.2 in forearm/Radius> she was asked to start Reclast & show TP 5/13 but couldn't make a decision; now she tells me she has decided NOT to take Reclast or any other osteoporosis therapy; I carefully reviewed the serious consequences of her decision & offered her second opinions w/ GYN, Endocrine, Rheum, whatever but she declines; I further offered to explain to her son but she also declined this intervention...    Hx stroke & cerebral atrophy> on Plavix75; she is noting problems w/ her memory as she is having to write herself notes; offered Aricept vs Neuro but she declines new meds or referral...    Anxiety> on Xanax0.25 prn; she'l take 1/2 tab prn & notes that it helps... We reviewed prob list, meds, xrays and labs> see below>> LABS 6/13:  FLP- at goals x LDL=111;  Chems- ok;  CBC- ok;   TSH=1.29;  VitD=50   12/02/2011 Acute OV  Complains of "not thinking clearly", blurred vision for 3 months , getting worse.   Symptoms have been on/off for last 2-3 months .  Seen by [facility] Neuro last week for same complaints.  Had MRI yesterday showed mild changes of chronic microvascular ischemia an dgeneralized cerbral atrophy. No change from 10/2009.  MMSE notes from neuro was 24/30.  Seen by eye doctor 2 weeks ago, with notable cataracts.  She has been off plavix for 3 months-'stopped due to nausea and felt it was  causing confusion"  Per chart review memory impairment has been an ongoing problem -although mild for few years -notable after stroke and loss of her son -(died from suicide)  We discussed restarting plavix for maximum protection she is agreeable.  Complains of discoloration of nails for last 6 months  No visual loss . No chest pain or edema. No ext weakness.  We discussed memory loss and consideration of Aricept -she refuses.  >>labs , restart plavix and return back to Neuro /Dr. Pearlean Brownie   12/12/11 Acute OV  Complains that ears are stopped up x3days Decreased hearing. 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 eval by Neurology/ stroke team showed tiny right occipital infarct, diffuse intra /extracranial atherosclerotic dis (but no focal stenoses or interventionable lesions) & a part left 3rd nerve palsy as well... PLAVIX was added to her ASA therapy... ~  11/10:  f/u DrSethi stopped ASA, continued Plavix for her cerebrovasc dis... ~  11/10:  [facility]- he predicted good prognosis for spont recovery of left eye movement. ~  1/11:  her left 3rd nerve palsy has resolved...  ALLERGIC RHINITIS (ICD-477.8) - uses OTC antihistamines infrequently...  HYPERTENSION, MILD (ICD-401.1) - she has  hx of HBP and on NORVASC 10mg /d...  ~  CXR 1/09 showed normal heart size, clear lungs, NAD... ~  12/12:  BP= 128/72 today and 130's/ 70's at home... denies HA, fatigue, CP, palipit, dizziness, syncope, dyspnea; min edema noted. ~  5-6/13:  BP= 134/70 but she is quite concerned about BPs up to 150-160 at home; we decided to add LOSARTAN 50mg /d to her meds & f/u in 6 weeks...  Hx of MITRAL VALVE PROLAPSE (ICD-424.0) - baseline EKG w/ NSR, WNL;  Neg stress thallium 4/93... she denies CP, palpit, SOB, etc... she exercises regularly walking 3-4 miles and going to the gym. ~  2DEcho 9/10 showed trivial prolapse of the post leaflet, norm LVF w/ EF= 65-70%, no regional wall motion abnormalities...  PERIPHERAL VASCULAR DISEASE (ICD-443.9) - on PLAVIX 75mg /d... CTAbd 8/06 showed thickening of Ao wall & prob plaque;  CDopplers 7/08 showed 0-39% bilat ICA stenoses & plaque in Rt subclavian art (plus Rt Thyroid Mass). ~  MRI/ MRA Brain & Neck 9/10 showed intra & extracranial atherosclerotic changes w/o interventionable lesions & no hemodynamically signif stenoses in the neck...  VENOUS INSUFFICIENCY (ICD-459.81) - she had a vein stripping yrs ago> she follows a low sodium diet & has no edema at this time...  HYPERCHOLESTEROLEMIA, BORDERLINE (ICD-272.4) - on diet + FISH OIL, she does not want meds. ~  FLP 1/11 showed TChol 199, TG 109, HDL 66, LDL 112 ~  FLP 7/11 showed TChol 212, TG 66, HDL 69, LDL 133 ~  FLP 1/12 showed TChol 208, TG 106, HDL 66, LDL 124 ~  FLP 7/12 showed TChol 198, TG 47, HDL 75, LDL 114 ~  FLP 6/13 showed TChol 190, TG 88, HDL 61, LDL 111  GOITER, UNSPECIFIED (ICD-240.9) - Old CXR's show deviation of trachea to left by thyroid & CDoppler showed right thyroid mass... she has no local symptoms, swallowing difficulty & is clinically euthyroid...  ~  labs 6/09 showed TSH= 1.04 ~  labs 6/10 showed TSH= 0.98 ~  labs 7/11 showed TSH= 1.40 ~  labs 7/12 showed  TSH= 1.30 ~  Labs 6/13 showed  TSH= 1.29  DIVERTICULOSIS OF COLON (ICD-562.10) & HEMORRHOIDS (ICD-455.6) - Hx of very tortuous & redundant colon w/ severe sigmoid diverticulosis;  last colonoscopy w/ pediatric scope 10/98 was difficult & subseq barium enema showed severe divertics otherwise neg;  Rx fiber, anusol, etc...  CTAbd 1/03 w/ divertics otherwise neg;  she is reminded to take the MIRALAX/ Metamucil regularly.  UNSPECIFIED CYSTITIS (ICD-595.9) - Urology eval 8/09 by DrDalstadt...  OSTEOARTHRITIS (ICD-715.90) - DJD in both hips L>Rt w/ LTHR done 2/09 by DrAlusio... she takes Glucosamine, Calcium, MVI, VitC & VitD. ~  8/11: c/o neck pain & CSpine films by DrSethi showed DDD, 3mm anterolisthesis C4 on C5, osteopenia... ~  2/12:  c/o continued neck discomfort ?eval by neurosurg she said?, advised rest, heat, Tramadol Prn.   OSTEOPOROSIS (ICD-733.00) - BMD by DrNeal 7/02 showed TScores -2.3 to -3.2;  regular f/u BMD from DrNeal - last 8/08 w/ TScores -2.6 to -3.2;  Vit D level checked by GYN in past & was 53 here 6/09... ~  12/09:  she reports that she stopped her Fosamax after 12 yrs Rx per DrNeal & he has rec Reclast but she is undecided & inclined to wait til next BMD and see if there has been any deterioration in measured bone density... ~  BMD 5/13:  Severe osteoporosis w/ TScores -2.9 in Spine & -3.3 in Femur; also measured -5.2 in forearm/Radius... rec to start Reclast yearly... ~  5/13:  She has declined Reclast & all other osteoporosis interventions offered to her; she has similarly declined referral for 2nd opinions etc...  CVA (ICD-434.91) - Hosp 9/10 w/ blurry vision & exam showing mild field cut & left eye strabismus... eval by Neurology/ stroke team showed tiny right occipital infarct, diffuse intra /extracranial atherosclerotic dis (but no focal stenoses or interventionable lesions) & a part left 3rd nerve palsy as well... PLAVIX was added to her ASA therapy... SEE DC SUMMARY  CEREBRAL ATROPHY (ICD-331.9) -  CTBrain 10/07 showed mild atrophy, otherw neg... she denies memory problems- with minor changes on MMSE testing... 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 ~  6/10: when asked about her memory she states "it depends on what day it is" ~  1/11:  I see signs of mild deterioration in memory but she denies- eg. she couldn't recall what DrTMartin told her at [facility] about her 3rd nerve paresis, and she insisted that her mother lived to 69, then later corrected herself (she lived to 22). ~  2/12: similar signs of memory impairment w/ her c/o blurry vision & neck pain> she couldn't recall prev w/u by neuro or results.  ANXIETY (ICD-300.00) - she has severe stress having found her son at home after a suicide... she has ALPRAZOLAM for Prn use but doesn't take it... she was rec to try Zoloft per Neuro but she never filled it.   Past Surgical History  Procedure Date  . Breast lumpectomy 1960's    benign breast biopsy  . Cholecystectomy 1980    at [facility]  Marland Kitchen Appendectomy 1980    at [facility].  . Abdominal hysterectomy 07/1980    hyst and rectocele repair by Dr. Dewaine Conger  . Oculoplastic eye surgery 12/1997    in [place]  . Vv stripping years ago     Outpatient Encounter Prescriptions as of 12/12/2011  Medication Sig Dispense Refill  . ALPRAZolam (XANAX) 0.25 MG tablet Take 1/2 to 1 tablet  by mouth three times daily as needed for nerves       . amLODipine (NORVASC) 10 MG tablet Take 1 tablet (10 mg total) by mouth daily.  30 tablet  11  . aspirin 81 MG tablet Take 81 mg by mouth daily.      Marland Kitchen Bioflavonoid Products (ESTER C PO) Take 1 tablet by mouth daily.        . calcium gluconate 500 MG tablet Take 500 mg by mouth daily.       . Cholecalciferol (VITAMIN D) 1000 UNITS capsule Take 1,000 Units by mouth daily.        . clopidogrel (PLAVIX) 75 MG tablet Take 1 tablet (75 mg total) by mouth daily.  30 tablet  3  . fish oil-omega-3 fatty acids 1000 MG capsule Take 2 g by mouth  daily.        . Glucosamine HCl 1000 MG TABS Take 1 tablet by mouth daily.        Marland Kitchen losartan (COZAAR) 50 MG tablet Take 1 tablet (50 mg total) by mouth daily.  30 tablet  5  . Multiple Vitamin (MULTIVITAMIN PO) Take 1 tablet by mouth daily.        . Psyllium (METAMUCIL) 30.9 % POWD 1 tsp daily         Allergies  Allergen Reactions  . Amoxicillin-Pot Clavulanate     REACTION: "fuzzy-headed"  . Sulfamethoxazole W-Trimethoprim     REACTION: allergy to Sulfa w/ flu-like illness.    Review of Systems        Constitutional:   No  weight loss, night sweats,  Fevers, chills, fatigue, or  lassitude.  HEENT:   No headaches,  Difficulty swallowing,  Tooth/dental problems, or  Sore throat,                No sneezing, itching, ear ache, nasal congestion, post nasal drip,   CV:  No chest pain,  Orthopnea, PND, swelling in lower extremities, anasarca, dizziness, palpitations, syncope.   GI  No heartburn, indigestion, abdominal pain, nausea, vomiting, diarrhea, change in bowel habits, loss of appetite, bloody stools.   Resp: No shortness of breath with exertion or at rest.  No excess mucus, no productive cough,  No non-productive cough,  No coughing up of blood.  No change in color of mucus.  No wheezing.  No chest wall deformity        Objective:   Physical Exam     WD,Thin, 78 y/o WF in NAD...  GENERAL:  Alert & oriented; pleasant & cooperative... HEENT:  [place]/AT, EOM- full & WNL, EACs-left wax impaction , TMs-wnl, NOSE-pale    THROAT-clear & WNL. NECK:  Supple w/ fairROM; no JVD; normal carotid impulses w/o bruits; palp thyroid , no lymphadenopathy. CHEST:  Decr BS bilat, clear- no wheezes/ rales/ rhonchi... HEART:  Regular Rhythm; without murmurs/ rubs/ or gallops heard... ABDOMEN:  Soft & nontender; normal bowel sounds; no organomegaly or masses detected. EXT: without deformities, mild arthritic changes; no varicose veins/ +venous insuffic/ no edema.  SKIN:  neg- w/o lesions...     Assessment & Plan:

## 2011-12-15 NOTE — Assessment & Plan Note (Signed)
Ear irrigation with partial removal of wax on left  Tolerated well  Plan ;  May use Debrox or Peroxide daily to loosen wax As needed   Return for ear wash later this week if not resolved.  Please contact office for sooner follow up if symptoms do not improve or worsen or seek emergency care

## 2011-12-21 ENCOUNTER — Telehealth: Payer: Self-pay | Admitting: Pulmonary Disease

## 2011-12-21 NOTE — Telephone Encounter (Signed)
Called spoke with patient who is still concerned about taking plavix 75mg .  Pt stated that her memory loss and confusion are "about the same" since last ov w/ TP but is now having dark stools "toward black" in color and w/ some coffey grounds x5 days.  Also stated that her "skin doesn't look like it normally does" and her nails are brownish in color.  Pt is wondering if coumadin/warfarin would be an option since the milligram dose is smaller (though I did attempt to explain to patient that the milligram is not associated with potency b/t different medications).  Pt is also aware that if she stops the plavix she will be at greater risk for blood clot.  Pt is requesting SN's recs but would like a call back on tomorrow.  Dr Kriste Basque please advise, thanks.  Last ov w/ TP 9.16 Last ov w/ SN 6.24, upcoming Dec 2013

## 2011-12-22 NOTE — Telephone Encounter (Signed)
I spoke with pt and made her aware of SN recs. She states she will stop the plavix and will take aspirin 81 mg daily. Will forward to SN as FYI.

## 2011-12-22 NOTE — Telephone Encounter (Signed)
Per SN-----coumadin not an option for her.  If she is intolerant to plavix the she will have to stop it and recs to substitute it with an 81 mg ec aspirin daily.  thanks

## 2011-12-27 ENCOUNTER — Telehealth: Payer: Self-pay | Admitting: Pulmonary Disease

## 2011-12-27 NOTE — Telephone Encounter (Signed)
Pt reports that finger nails are turning brown on both hands.  Denies soreness at nail area.  Also c/o increased confusion here lately.  PT is concerned that she may have some type of blockage.  Pt denies headaches or dizziness but occas has some blurred vision.  Please advise.

## 2011-12-27 NOTE — Telephone Encounter (Signed)
Pt advised. Mirelle Biskup, CMA  

## 2011-12-27 NOTE — Telephone Encounter (Signed)
Pt is requesting to be referred to a specialist.  Pt would like to speak w/ someone asap.  [name]

## 2011-12-27 NOTE — Telephone Encounter (Signed)
Per SN---brown nails will need to be seen by Dermatology---refer to dermatology.  Pt will also need referral to neuro for eval of difficulty with memory--Dr. Anne Hahn.  thanks

## 2012-01-05 ENCOUNTER — Telehealth: Payer: Self-pay | Admitting: Pulmonary Disease

## 2012-01-05 DIAGNOSIS — R413 Other amnesia: Secondary | ICD-10-CM

## 2012-01-05 DIAGNOSIS — B351 Tinea unguium: Secondary | ICD-10-CM

## 2012-01-05 NOTE — Telephone Encounter (Signed)
Pt is aware that i will place these orders for her and we will work on getting her an appt with dermatology and neurology.

## 2012-01-12 ENCOUNTER — Telehealth: Payer: Self-pay | Admitting: Pulmonary Disease

## 2012-01-12 NOTE — Telephone Encounter (Signed)
Pt returned call. Says "she will call back tomorrow [name]

## 2012-01-12 NOTE — Telephone Encounter (Signed)
ATC na wcb. RX sent 12/02/11 #30 x 3 refills.

## 2012-01-12 NOTE — Telephone Encounter (Signed)
ATC at home number provided. No answer, no option to leave message. WCB

## 2012-01-13 ENCOUNTER — Telehealth: Payer: Self-pay | Admitting: Pulmonary Disease

## 2012-01-13 NOTE — Telephone Encounter (Signed)
ATC PT NA unable to leave VM wcb 

## 2012-01-13 NOTE — Telephone Encounter (Signed)
[email or web address]  

## 2012-01-13 NOTE — Telephone Encounter (Signed)
I spoke with the pt and she states her BP today was 145/66, HR 88. Pt denies any SOB, chest pain, dizziness, fatigue, palpitations. I advised the pt to continue her meds as directed, I reviewed meds with her, and to continue to monitor her BP at home. Advised the pt that if she develops any of the above named symptoms to call on call or go to ER. Pt states understanding. [name], CMA

## 2012-01-16 NOTE — Telephone Encounter (Signed)
Pt aware rx was taken care of in Sept 2013 and she should check with her pharmacy. Pt verbalized understanding.

## 2012-01-17 ENCOUNTER — Telehealth: Payer: Self-pay | Admitting: Pulmonary Disease

## 2012-01-17 NOTE — Telephone Encounter (Signed)
Per SN---he is aware.  Tell her we will mark her chart intolerant to plavix.  So glad that she is feeling better. thanks

## 2012-01-17 NOTE — Telephone Encounter (Signed)
I spoke with pt and is aware of this. She needed nothing further

## 2012-01-17 NOTE — Telephone Encounter (Signed)
I spoke with pt and she stated she d/c'd the plavix couple weeks now due to having blurred vision, feels "terrible", and thinking was not clear. She stated she ahs been taking aspirin 81 mg daily since stopping the plavix. Will forward to SN as an [place]

## 2012-01-18 DIAGNOSIS — G3184 Mild cognitive impairment, so stated: Secondary | ICD-10-CM | POA: Diagnosis not present

## 2012-01-18 DIAGNOSIS — R413 Other amnesia: Secondary | ICD-10-CM | POA: Diagnosis not present

## 2012-01-19 ENCOUNTER — Other Ambulatory Visit: Payer: Self-pay | Admitting: Neurology

## 2012-01-19 DIAGNOSIS — R413 Other amnesia: Secondary | ICD-10-CM

## 2012-01-30 DIAGNOSIS — R51 Headache: Secondary | ICD-10-CM | POA: Diagnosis not present

## 2012-01-30 DIAGNOSIS — R413 Other amnesia: Secondary | ICD-10-CM | POA: Diagnosis not present

## 2012-01-31 ENCOUNTER — Telehealth: Payer: Self-pay | Admitting: Pulmonary Disease

## 2012-01-31 NOTE — Telephone Encounter (Signed)
Called and spoke with pt and she stated that she has been having chills and feeling disoriented.  Pt stated that she had an eeg done yesterday.   She feels that she may need to be back on the plavix due to her hx of blood clots.  She stated that she has been taking the aspirin daily.  Pt stated that she is not having any fever but the chills come more at night.  Pt denied any cough, congestion or urinary symptoms.  Pt stated that she is worried about her condition.  She stated that this is not urgent and is ok with a call back in the morning.  SN please advise. Thanks  Allergies  Allergen Reactions  . Amoxicillin-Pot Clavulanate     REACTION: "fuzzy-headed"  . Plavix (Clopidogrel Bisulfate)     Confused, blurred vision  . Sulfamethoxazole W-Trimethoprim     REACTION: allergy to Sulfa w/ flu-like illness.

## 2012-02-01 NOTE — Telephone Encounter (Signed)
Per SN---recs to take the tylenol 2 tabs every 6 hours as needed for pain/chills and we can set her up to see TP if she needs to be seen.  thanks

## 2012-02-01 NOTE — Telephone Encounter (Signed)
Called and spoke with pt and she is aware to take the tylenol as directed by SN.  She was advised to call neuro to follow up on the results of the EEG  That the pt stated was done on Monday and will need to have eval of the confusion that she stated that has come back.  Pt is aware and voiced her understanding to call neuro for an appt.

## 2012-02-08 ENCOUNTER — Ambulatory Visit
Admission: RE | Admit: 2012-02-08 | Discharge: 2012-02-08 | Disposition: A | Discharge status: Home / Self Care | Payer: Medicare Other | Source: Ambulatory Visit | Attending: Neurology | Admitting: Neurology

## 2012-02-08 DIAGNOSIS — R413 Other amnesia: Secondary | ICD-10-CM

## 2012-02-08 MED ORDER — GADOBENATE DIMEGLUMINE 529 MG/ML IV SOLN
9.0000 mL | Freq: Once | INTRAVENOUS | Status: AC | PRN
  Administered 2012-02-08: 9 mL via INTRAVENOUS

## 2012-02-14 ENCOUNTER — Telehealth: Payer: Self-pay | Admitting: Pulmonary Disease

## 2012-02-14 MED ORDER — CLOPIDOGREL BISULFATE 75 MG PO TABS: 75.0000 mg | ORAL_TABLET | Freq: Every day | ORAL | Status: DC

## 2012-02-14 NOTE — Telephone Encounter (Signed)
Per SN---ok to call in plavix 75 mg  1 daily #30.  thanks

## 2012-02-14 NOTE — Telephone Encounter (Signed)
Spoke with patient in regards to waking up with blurry eyes and some confusion. Patient is requesting a Rx for Plavix. Patient states that this has happened before when she was having difficulty with leg circulation and she was rxd plavix and this rx helped with the symptoms she was having.  Patient would like rx sent to [facility] or appt with SN if needed.   Dr Kriste Basque please advise. Thanks.

## 2012-02-14 NOTE — Telephone Encounter (Signed)
Plavix 75 mg 1 daily #30. x 0rf sent to [facility] [address].  Left detailed msg on machine.

## 2012-02-20 ENCOUNTER — Telehealth: Payer: Self-pay | Admitting: Pulmonary Disease

## 2012-02-20 NOTE — Telephone Encounter (Signed)
Per SN----have the pt check with Dr. Pearlean Brownie about the resutls of her MRI---we dont have these results.  thanks

## 2012-02-20 NOTE — Telephone Encounter (Signed)
Pt c/o blurred vision for past 2 days.  Denies headaches or dizziness.  Pt had MRI of brain on 02-08-12 ordered by Dr Pearlean Brownie.  Please advise on what needs to do from here.

## 2012-02-20 NOTE — Telephone Encounter (Signed)
Spoke with pt and notified of recs per SN Pt verbalized understanding  

## 2012-02-27 ENCOUNTER — Telehealth: Payer: Self-pay | Admitting: Pulmonary Disease

## 2012-02-27 NOTE — Telephone Encounter (Signed)
ASHTYN N MABE, CMA 02/14/2012 4:30 PM Signed  Plavix 75 mg 1 daily #30. x 0rf sent to [facility] [address]  Left detailed msg on machine.   Randell Loop, [facility] 02/14/2012 4:14 PM Signed  Per SN---ok to call in plavix 75 mg 1 daily #30. thanks Nita Sells, CMA 02/14/2012 10:05 AM Signed  Spoke with patient in regards to waking up with blurry eyes and some confusion. Patient is requesting a Rx for Plavix. Patient states that this has happened before when she was having difficulty with leg circulation and she was rxd plavix and this rx helped with the symptoms she was having.  Patient would like rx sent to [facility] or appt with SN if needed.  Dr Kriste Basque please advise. Thanks.   I spoke with the pt and she states she never received the message so she never went to get the medication. I advised the pt to go get the med and start and let us know how her symptoms do. Pt states understanding.[name], CMA

## 2012-03-12 ENCOUNTER — Encounter: Payer: Self-pay | Admitting: *Deleted

## 2012-03-13 ENCOUNTER — Ambulatory Visit (INDEPENDENT_AMBULATORY_CARE_PROVIDER_SITE_OTHER): Payer: Medicare Other | Admitting: Pulmonary Disease

## 2012-03-13 ENCOUNTER — Encounter: Payer: Self-pay | Admitting: Pulmonary Disease

## 2012-03-13 VITALS — BP 120/80 | HR 73 | Temp 98.1°F | Ht 61.0 in | Wt 104.0 lb

## 2012-03-13 DIAGNOSIS — M1990 Unspecified osteoarthritis, unspecified site: Secondary | ICD-10-CM

## 2012-03-13 DIAGNOSIS — I059 Rheumatic mitral valve disease, unspecified: Secondary | ICD-10-CM

## 2012-03-13 DIAGNOSIS — R413 Other amnesia: Secondary | ICD-10-CM

## 2012-03-13 DIAGNOSIS — E785 Hyperlipidemia, unspecified: Secondary | ICD-10-CM

## 2012-03-13 DIAGNOSIS — K5730 Diverticulosis of large intestine without perforation or abscess without bleeding: Secondary | ICD-10-CM

## 2012-03-13 DIAGNOSIS — I739 Peripheral vascular disease, unspecified: Secondary | ICD-10-CM | POA: Diagnosis not present

## 2012-03-13 DIAGNOSIS — I6350 Cerebral infarction due to unspecified occlusion or stenosis of unspecified cerebral artery: Secondary | ICD-10-CM | POA: Diagnosis not present

## 2012-03-13 DIAGNOSIS — F411 Generalized anxiety disorder: Secondary | ICD-10-CM

## 2012-03-13 DIAGNOSIS — G319 Degenerative disease of nervous system, unspecified: Secondary | ICD-10-CM

## 2012-03-13 DIAGNOSIS — E049 Nontoxic goiter, unspecified: Secondary | ICD-10-CM

## 2012-03-13 DIAGNOSIS — I10 Essential (primary) hypertension: Secondary | ICD-10-CM | POA: Diagnosis not present

## 2012-03-13 DIAGNOSIS — I872 Venous insufficiency (chronic) (peripheral): Secondary | ICD-10-CM | POA: Diagnosis not present

## 2012-03-13 DIAGNOSIS — M810 Age-related osteoporosis without current pathological fracture: Secondary | ICD-10-CM

## 2012-03-13 NOTE — Progress Notes (Signed)
Subjective:    Patient ID: [name], female    DOB: 25-Nov-1924, 78 y.o.   MRN: [id number]  HPI 78 y/o WF here for a follow up visit... she has multiple medical problems as noted below...  Followed for general medical purposes w/ hx of HBP, mild MVP, ASPVD, Goiter, Divertics, DJD w/ left THR, Osteoporosis, hx right occipital stoke & 3rd N paresis, Memory loss, & mod severe anxiety...  ~  February 28, 2011:  26mo ROV & she is doing well overall just c/o some gas & we discussed Simethacone preps for this- Mylicon, GasX, Phazyme, etc...  BP well controlled on Amlodip & she denies CP, palpit, SOB, edema; she continues to exercise regularly at the gym; she remains on Plavix & denies cerebral ischemic symptoms; we reviewed labs from 7/12 and FLP ok on her diet + FishOil and Thyroid remains wnl; hx DJD w/ prev left THR & she uses OTC analgesics as needed; she is still not using the Alpraz anxiolytic...  She had the Shingles vaccine & the Flu shot this yr; Plavix & Amlodip refilled per request...  ~  Jul 29, 2011:  81mo ROV & [name] is added-on for mult symptoms> states BP has been up (readings 150-160 at home) & she is very concerned "I can feel it when it's up- I'm more anxious, not as calm" she will take 1/4th of an Alpraz0.5mg  tab & this helps she says;  Also c/o eyes feel strained & left arm/ shoulder discomfort- but it's not too bad & she refuses Ortho eval... We decided to add LOSARTAN 50mg /d to her regimen & recheck her BP in about 6 weeks... We reviewed her prob list, meds, Xrays & labs>> BMD 5/13:  Severe osteoporosis w/ TScores -2.9 in Spine & -3.3 in Femur; also measured -5.2 in forearm/Radius... rec to start Reclast yearly...  ~  September 19, 2011:  6wk ROV & [name] notes some trouble w/ her memory (see below) and is here to review the need for Reclast to treat her osteoporosis (see below);  Oddly enough- while she refuses interventions for these important medical issues, her CC today is a minor  discoloration of her nails that concerns her & she is referred to [facility] for this analysis...    HBP> on Norvasc10, Cozaar50;  BP= 142/72 & she denies CP, palpit, SOB, edema...    MVP> she denies CP, palpit, and exercises daily at he gym...    ASPVD> on Plavix75; she denies cerebral ischemic symptoms...    Chol> on FishOil; FLP looks ok showing TChol 190, TG 88, HDL 61, LDL 111    Goiter> palp thyroid, no change, and euthyroid w/ TSH=1.29...    GI> Divertics, Hems> on Metamucil;     DJD> on Glucosamine etc; she denies neck discomfort etc & still works out daily in [employer]...    Osteoporosis> on Calcium, VitD, MVI; BMD 5/13 showed severe osteoporosis w/ TScores -2.9 in Spine & -3.3 in Femur; also measured -5.2 in forearm/Radius> she was asked to start Reclast & show TP 5/13 but couldn't make a decision; now she tells me she has decided NOT to take Reclast or any other osteoporosis therapy; I carefully reviewed the serious consequences of her decision & offered her second opinions w/ GYN, Endocrine, Rheum, whatever but she declines; I further offered to explain to her son but she also declined this intervention...    Hx stroke & cerebral atrophy> on Plavix75; she is noting problems w/ her memory as she  is having to write herself notes; offered Aricept vs Neuro but she declines new meds or referral...    Anxiety> on Xanax0.25 prn; she'l take 1/2 tab prn & notes that it helps... We reviewed prob list, meds, xrays and labs> see below>> LABS 6/13:  FLP- at goals x LDL=111;  Chems- ok;  CBC- ok;  TSH=1.29;  VitD=50  ~  March 13, 2012:  23mo ROV & [name] is stable but has mult somatic complaints that she blames on her Plavix rx;  We reviewed the following medical problems during today's office visit >>     HBP> on Norvasc10, Cozaar50;  BP= 142/72 & she denies CP, palpit, SOB, edema...    MVP> she denies CP, palpit, and exercises daily at he gym...    ASPVD> on ASA81, Plavix75; she denies cerebral ischemic  symptoms...    Chol> on FishOil; she has refused any & all statin meds; FLP 6/13 looked ok showing TChol 190, TG 88, HDL 61, LDL 111    Goiter> palp thyroid, no change, and euthyroid w/ TSH=1.29...    GI> Divertics, Hems> on Metamucil;     DJD> on Glucosamine, bioflavinoids, etc; she denies neck discomfort etc & still works out daily in the gym...    Osteoporosis> on Calcium, VitD, MVI; BMD 5/13 showed severe osteoporosis w/ TScores -2.9 in Spine & -3.3 in Femur; also measured -5.2 in forearm/Radius> she was asked to start Reclast & saw TP 5/13 but couldn't make a decision; now she tells me she has decided NOT to take Reclast or any other osteoporosis therapy; I carefully reviewed the serious consequences of her decision & offered her second opinions w/ GYN, Endocrine, Rheum, whatever but she declines; I further offered to explain to her son but she also declined this intervention...    Hx stroke & cerebral atrophy> on ASA81, Plavix75; she had right occipital infarct 9/10; she is followed by Neuro, Pearlean Brownie- note 8/13 from NP is reviewed> MMSE 24/30, mult somatic complaints, anxious & wanted MRI since several relatives w/ brain tumors- MRI 11/13 showed chr microvasc ischemic dis & generalized cerebral atrophy, no acute ischemia, no lesions seen...    Anxiety> on Xanax0.25 prn; she'll take 1/2 tab prn & notes that it helps... We reviewed prob list, meds, xrays and labs> see below for updates >> she had the 2013 Flu vaccine 10/13...          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 eval by Neurology/ stroke team showed tiny right occipital infarct, diffuse intra /extracranial atherosclerotic dis (but no focal stenoses or interventionable lesions) & a part left 3rd nerve palsy as well... PLAVIX was added to her ASA therapy... ~  11/10:  f/u DrSethi stopped ASA, continued  Plavix for her cerebrovasc dis... ~  11/10:  [facility]- he predicted good prognosis for spont recovery of left eye movement. ~  1/11:  her left 3rd nerve palsy has resolved...  ALLERGIC RHINITIS (ICD-477.8) - uses OTC antihistamines infrequently...  HYPERTENSION, MILD (ICD-401.1) - she has hx of HBP and on NORVASC 10mg /d...  ~  CXR 1/09 showed normal heart size, clear lungs, NAD... ~  12/12:  BP= 128/72 today and 130's/ 70's at home... denies HA, fatigue, CP, palipit, dizziness, syncope, dyspnea; min edema noted. ~  5-6/13:  BP= 134/70 but she is quite concerned about  BPs up to 150-160 at home; we decided to add LOSARTAN 50mg /d to her meds & f/u in 6 weeks... ~  12/13: on Norvasc10, Cozaar50;  BP= 142/72 & she denies CP, palpit, SOB, edema.  Hx of MITRAL VALVE PROLAPSE (ICD-424.0) - baseline EKG w/ NSR, WNL;  Neg stress thallium 4/93... she denies CP, palpit, SOB, etc... she exercises regularly walking 3-4 miles and going to the gym. ~  2DEcho 9/10 showed trivial prolapse of the post leaflet, norm LVF w/ EF= 65-70%, no regional wall motion abnormalities...  PERIPHERAL VASCULAR DISEASE (ICD-443.9) - on ASA81, PLAVIX 75mg /d... CTAbd 8/06 showed thickening of Ao wall & prob plaque;  CDopplers 7/08 showed 0-39% bilat ICA stenoses & plaque in Rt subclavian art (plus Rt Thyroid Mass). ~  MRI/ MRA Brain & Neck 9/10 showed intra & extracranial atherosclerotic changes w/o interventionable lesions & no hemodynamically signif stenoses in the neck...  VENOUS INSUFFICIENCY (ICD-459.81) - she had a vein stripping yrs ago> she follows a low sodium diet & has no edema at this time...  HYPERCHOLESTEROLEMIA, BORDERLINE (ICD-272.4) - on diet + FISH OIL, she does not want meds. ~  FLP 1/11 showed TChol 199, TG 109, HDL 66, LDL 112 ~  FLP 7/11 showed TChol 212, TG 66, HDL 69, LDL 133 ~  FLP 1/12 showed TChol 208, TG 106, HDL 66, LDL 124 ~  FLP 7/12 showed TChol 198, TG 47, HDL 75, LDL 114 ~  FLP 6/13  showed TChol 190, TG 88, HDL 61, LDL 111  GOITER, UNSPECIFIED (ICD-240.9) - Old CXR's show deviation of trachea to left by thyroid & CDoppler showed right thyroid mass... she has no local symptoms, swallowing difficulty & is clinically euthyroid...  ~  labs 6/09 showed TSH= 1.04 ~  labs 6/10 showed TSH= 0.98 ~  labs 7/11 showed TSH= 1.40 ~  labs 7/12 showed TSH= 1.30 ~  Labs 6/13 showed TSH= 1.29  DIVERTICULOSIS OF COLON (ICD-562.10) & HEMORRHOIDS (ICD-455.6) - Hx of very tortuous & redundant colon w/ severe sigmoid diverticulosis;  last colonoscopy w/ pediatric scope 10/98 was difficult & subseq barium enema showed severe divertics otherwise neg;  Rx fiber, anusol, etc...  CTAbd 1/03 w/ divertics otherwise neg;  she is reminded to take the MIRALAX/ Metamucil regularly.  UNSPECIFIED CYSTITIS (ICD-595.9) - Urology eval 8/09 by DrDalstadt...  OSTEOARTHRITIS (ICD-715.90) - DJD in both hips L>Rt w/ LTHR done 2/09 by DrAlusio... she takes Glucosamine, Calcium, MVI, VitC & VitD. ~  8/11: c/o neck pain & CSpine films by DrSethi showed DDD, 3mm anterolisthesis C4 on C5, osteopenia... ~  2/12:  c/o continued neck discomfort ?eval by neurosurg she said?, advised rest, heat, Tramadol Prn.   OSTEOPOROSIS (ICD-733.00) - BMD by DrNeal 7/02 showed TScores -2.3 to -3.2;  regular f/u BMD from DrNeal - last 8/08 w/ TScores -2.6 to -3.2;  Vit D level checked by GYN in past & was 53 here 6/09... ~  12/09:  she reports that she stopped her Fosamax after 12 yrs Rx per DrNeal & he has rec Reclast but she is undecided & inclined to wait til next BMD and see if there has been any deterioration in measured bone density... ~  BMD 5/13:  Severe osteoporosis w/ TScores -2.9 in Spine & -3.3 in Femur; also measured -5.2 in forearm/Radius... rec to start Reclast yearly... ~  5/13:  She has declined Reclast & all other osteoporosis interventions offered to her; she has similarly declined referral for 2nd opinions etc... ~  12/13: we reviewed prev data & again offered Reclast rx, & referrals for 2nd opinions; she declines all interventions...  CVA (ICD-434.91) - Hosp 9/10 w/ blurry vision & exam showing mild field cut & left eye strabismus... eval by Neurology/ stroke team showed tiny right occipital infarct, diffuse intra /extracranial atherosclerotic dis (but no focal stenoses or interventionable lesions) & a part left 3rd nerve palsy as well... PLAVIX was added to her ASA therapy... SEE DC SUMMARY  CEREBRAL ATROPHY (ICD-331.9) - CTBrain 10/07 showed mild atrophy, otherw neg... she denies memory problems- with minor changes on MMSE testing... 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 ~  6/10: when asked about her memory she states "it depends on what day it is" ~  1/11:  I see signs of mild deterioration in memory but she denies- eg. she couldn't recall what DrTMartin told her at [facility] about her 3rd nerve paresis, and she insisted that her mother lived to 51, then later corrected herself (she lived to 50). ~  2/12: similar signs of memory impairment w/ her c/o blurry vision & neck pain> she couldn't recall prev w/u by neuro or results. ~  F/u Neuro(Sethi)- note 8/13 from NP is reviewed> MMSE 24/30, mult somatic complaints, anxious & wanted MRI since several relatives w/ brain tumors- MRI 11/13 showed chr microvasc ischemic dis & generalized cerebral atrophy, no acute ischemia, no lesions seen...  ANXIETY (ICD-300.00) - she has severe stress having found her son at home after a suicide... she has ALPRAZOLAM for Prn use but doesn't take it... she was rec to try Zoloft per Neuro but she never filled it.   Past Surgical History  Procedure Date  . Breast lumpectomy 1960's    benign breast biopsy  . Cholecystectomy 1980    at [facility].  Marland Kitchen Appendectomy 1980    at [facility].  . Abdominal hysterectomy 07/1980    hyst and rectocele repair by Dr. Dewaine Conger  . Oculoplastic eye surgery 12/1997    in  [place]  . Vv stripping years ago     Outpatient Encounter Prescriptions as of 03/13/2012  Medication Sig Dispense Refill  . ALPRAZolam (XANAX) 0.25 MG tablet Take 1/2 to 1 tablet by mouth three times daily as needed for nerves       . amLODipine (NORVASC) 10 MG tablet Take 1 tablet (10 mg total) by mouth daily.  30 tablet  11  . aspirin 81 MG tablet Take 81 mg by mouth daily.      Marland Kitchen Bioflavonoid Products (ESTER C PO) Take 1 tablet by mouth daily.        . calcium gluconate 500 MG tablet Take 500 mg by mouth daily.       . Cholecalciferol (VITAMIN D) 1000 UNITS capsule Take 1,000 Units by mouth daily.        . clopidogrel (PLAVIX) 75 MG tablet Take 1 tablet (75 mg total) by mouth daily.  30 tablet  0  . fish oil-omega-3 fatty acids 1000 MG capsule Take 2 g by mouth daily.        . Glucosamine HCl 1000 MG TABS Take 1 tablet by mouth daily.        Marland Kitchen losartan (COZAAR) 50 MG tablet Take 1 tablet (50 mg total) by mouth daily.  30 tablet  5  . Multiple Vitamin (MULTIVITAMIN PO) Take 1 tablet by mouth daily.        . Psyllium (METAMUCIL) 30.9 % POWD 1 tsp  daily         Allergies  Allergen Reactions  . Sulfamethoxazole W-Trimethoprim Other (See Comments)    allergy to Sulfa w/ flu-like illness.  . Amoxicillin-Pot Clavulanate Other (See Comments)     "fuzzy-headed"  . Plavix (Clopidogrel Bisulfate) Other (See Comments)    Confused, blurred vision    Review of Systems        See HPI - all other systems neg except as noted... The patient denies anorexia, fever, weight loss, weight gain, vision loss, decreased hearing, hoarseness, chest pain, syncope, dyspnea on exertion, peripheral edema, prolonged cough, headaches, hemoptysis, abdominal pain, melena, hematochezia, severe indigestion/heartburn, hematuria, incontinence, muscle weakness, suspicious skin lesions, transient blindness, difficulty walking, depression, unusual weight change, abnormal bleeding, enlarged lymph nodes, and angioedema.     Objective:   Physical Exam     WD,Thin, 78 y/o WF in NAD... she is moderately anxious today... GENERAL:  Alert & oriented; pleasant & cooperative... HEENT:  /AT, EOM- full & WNL, EACs-clear, TMs-wnl, NOSE-pale w/ clear discharge, THROAT-clear & WNL. NECK:  Supple w/ fairROM; no JVD; normal carotid impulses w/o bruits; palp thyroid w/o change, no lymphadenopathy. CHEST:  Decr BS bilat, clear- no wheezes/ rales/ rhonchi... HEART:  Regular Rhythm; without murmurs/ rubs/ or gallops heard... ABDOMEN:  Soft & nontender; normal bowel sounds; no organomegaly or masses detected. EXT: without deformities, mild arthritic changes; no varicose veins/ +venous insuffic/ no edema. NEURO: CN's intact & no other neuro deficits... some decr ROM neck. SKIN:  neg- w/o lesions...  RADIOLOGY DATA:  Reviewed in the EPIC EMR & discussed w/ the patient...  LABORATORY DATA:  Reviewed in the EPIC EMR & discussed w/ the patient...   Assessment & Plan:    OPHTHALMOLOGY>  She was to f/u w/ new eye doctor at [facility], ?if she went, we don't have notes, she can't remember...  HBP>  BP improved on Amlodipine & Losartan50; continue same...  Periph Vasc Dis/ Hx of STROKE>  On ASA & Plavix daily, no cerebral ischemic symptoms...  CHOL>  FLP has been OK on diet alone, keep up the good work...  GOITER>  Denies compressive symptoms or hyper/hypo symptoms; chemically euthyroid as well & following...  DIVERTICULOSIS>  Known severe sigm divertics & reminded to take Metamucil/ Miralax regularly... We discussed Simethacone for gas symptoms.  DJD>  She had Tramadol to use prn but prefers OTC meds- Tylenol/ Advil etc...  Osteoporosis>  She gets BMDs from DrNeal, Gyn & was prev on Fosamax; this was stopped after 10+ yrs rx & DrNeal has rec reclast but she has refused rx.  MEMORY LOSS>  She has some atrophy on scans & minor changes on prev MMSE; she has declined Aricept & prev tried OTC memory  supplements.  ANXIETY>  As noted she feels that the Alpraz caused trouble thinking but does not want substitute medication for nerves...   Patient's Medications  New Prescriptions   No medications on file  Previous Medications   ALPRAZOLAM (XANAX) 0.25 MG TABLET    Take 1/2 to 1 tablet by mouth three times daily as needed for nerves    ASPIRIN 81 MG TABLET    Take 81 mg by mouth daily.   BIOFLAVONOID PRODUCTS (ESTER C PO)    Take 1 tablet by mouth daily.     CALCIUM GLUCONATE 500 MG TABLET    Take 500 mg by mouth daily.    CHOLECALCIFEROL (VITAMIN D) 1000 UNITS CAPSULE    Take 1,000 Units by mouth  daily.     CLOPIDOGREL (PLAVIX) 75 MG TABLET    Take 1 tablet (75 mg total) by mouth daily.   FISH OIL-OMEGA-3 FATTY ACIDS 1000 MG CAPSULE    Take 2 g by mouth daily.     GLUCOSAMINE HCL 1000 MG TABS    Take 1 tablet by mouth daily.     LOSARTAN (COZAAR) 50 MG TABLET    Take 1 tablet (50 mg total) by mouth daily.   MULTIPLE VITAMIN (MULTIVITAMIN PO)    Take 1 tablet by mouth daily.     PSYLLIUM (METAMUCIL) 30.9 % POWD    1 tsp daily   Modified Medications   Modified Medication Previous Medication   AMLODIPINE (NORVASC) 10 MG TABLET amLODipine (NORVASC) 10 MG tablet      Take 1 tablet (10 mg total) by mouth daily.    Take 1 tablet (10 mg total) by mouth daily.  Discontinued Medications   No medications on file

## 2012-03-13 NOTE — Patient Instructions (Addendum)
Today we updated your med list in our EPIC system...    Continue your current medications the same...  Call for any questions or if we can be of service in any way...  Let's plan a follow up appt in 6 months w/ FASTING blood work at that time.Marland KitchenMarland Kitchen

## 2012-03-15 DIAGNOSIS — G3184 Mild cognitive impairment, so stated: Secondary | ICD-10-CM | POA: Diagnosis not present

## 2012-03-15 DIAGNOSIS — R413 Other amnesia: Secondary | ICD-10-CM | POA: Diagnosis not present

## 2012-03-22 ENCOUNTER — Other Ambulatory Visit: Payer: Self-pay | Admitting: Pulmonary Disease

## 2012-03-22 ENCOUNTER — Telehealth: Payer: Self-pay | Admitting: Pulmonary Disease

## 2012-03-22 MED ORDER — AMLODIPINE BESYLATE 10 MG PO TABS: 10.0000 mg | ORAL_TABLET | Freq: Every day | ORAL | Status: DC

## 2012-03-22 NOTE — Telephone Encounter (Signed)
Spoke with pharmacist and okayed refill for amlodipine Nothing further needed

## 2012-04-11 ENCOUNTER — Telehealth: Payer: Self-pay | Admitting: Pulmonary Disease

## 2012-04-11 NOTE — Telephone Encounter (Signed)
Per SN----add the losartan 50 mg  1 daily for her BP.  thanks

## 2012-04-11 NOTE — Telephone Encounter (Signed)
I spoke with pt. Aware of SN recs. She voiced her understanding and needed nothing further

## 2012-04-11 NOTE — Telephone Encounter (Signed)
Pt called back. She stated she found her losartan pill bottle but have not been taking this. She stated it was behind her other pill bottles and must have looked over this. Please advise SN thanks

## 2012-04-11 NOTE — Telephone Encounter (Signed)
I spoke with pt she stated her BP has been running high. Readings are: 151/74 pulse 91, 162/79 pulse 79, 144/71 pulse 86, 156/70 pulse 89, 143/59 pulse 93. She has been taking amlodipine 10 mg QD. She has losartan 50 mg on her medlist but states she is not taking that medication and is not familiar with that medication. Please advise SN thanks Last OV 03/13/12 Pending OV 09/11/12

## 2012-04-13 ENCOUNTER — Telehealth: Payer: Self-pay | Admitting: Pulmonary Disease

## 2012-04-13 NOTE — Telephone Encounter (Signed)
Spoke with pt.  PT c/o blurred vision for 2 days with some increase in confusion.  Denies headache or dizziness.  B/P assessed and was 140/70.  Dr Kriste Basque advised to stay on amlodipine and losartan and give extra b/p med time to work.  Get plenty of rest and if feeling anxiety about b/p ok to take xanax.  Advised to notify our office if no better.

## 2012-04-23 ENCOUNTER — Telehealth: Payer: Self-pay | Admitting: Pulmonary Disease

## 2012-04-23 NOTE — Telephone Encounter (Signed)
Per SN----BP is good but she's been on these meds.     1.  Observe off meds with BP 2.  Refer to cardiology for 2nd opinion  Ask her what she wants to do.  thanks

## 2012-04-23 NOTE — Telephone Encounter (Signed)
PT reports that she took last doses of Amlodipine and Losartan yesterday and doesn't want to refill these again because they are not helping her.  Pt states that vision is more blurry than ever and this causes her head to feel "funny".  Pt denies headaches or dizziness.  B/P reading 124/57 and pulse 79.  Pt thinks that a shot of Penicillin will help her.  Please advise.

## 2012-04-23 NOTE — Telephone Encounter (Signed)
Pt informed of Dr Jodelle Green recommendations and she will let us know if b/p elevates.  She does not want cardiology at this time.

## 2012-04-23 NOTE — Telephone Encounter (Signed)
Pt says to tell SN if he gave her a shot of penicillin it may help her.[name]

## 2012-05-14 ENCOUNTER — Encounter: Payer: Self-pay | Admitting: Nurse Practitioner

## 2012-06-19 ENCOUNTER — Ambulatory Visit: Payer: Self-pay | Admitting: Neurology

## 2012-07-06 ENCOUNTER — Telehealth: Payer: Self-pay | Admitting: Pulmonary Disease

## 2012-07-06 NOTE — Telephone Encounter (Signed)
I spoke with [name]. She stated the referral we made for her to see Dr. Pearlean Brownie they cancelled. She was nto told why. She stated when they r/s it for her was told next available was not until July. She stated she does not want to wait this long. She asks that we call and get her in sooner d/t her family hx. They advised her we had to call to get her in sooner. Please advise PCC's thanks

## 2012-07-09 NOTE — Telephone Encounter (Signed)
I spoke with the [name] and asked if she wants [place] to call for a sooner appt with Dr. Pearlean Brownie. [name] states NO she wanted to know should she continue her plavix. I advised if she has been taking it and has not been advised to stop then she should continue this medication until visit. [name] states understanding. [name], CMA

## 2012-07-09 NOTE — Telephone Encounter (Signed)
Pt states she is still really confused about this referral.  2nd Issue:  Pt would like to know if it will be ok if she has her clopidogrel refilled while awaiting this referral.  Antionette Fairy

## 2012-07-28 ENCOUNTER — Inpatient Hospital Stay (HOSPITAL_COMMUNITY)
Admission: EM | Admit: 2012-07-28 | Discharge: 2012-07-31 | DRG: 369 | Disposition: A | Discharge status: Home / Self Care | Payer: Medicare Other | Attending: Internal Medicine | Admitting: Internal Medicine

## 2012-07-28 ENCOUNTER — Encounter (HOSPITAL_COMMUNITY): Payer: Self-pay

## 2012-07-28 DIAGNOSIS — Z8673 Personal history of transient ischemic attack (TIA), and cerebral infarction without residual deficits: Secondary | ICD-10-CM

## 2012-07-28 DIAGNOSIS — I059 Rheumatic mitral valve disease, unspecified: Secondary | ICD-10-CM

## 2012-07-28 DIAGNOSIS — G319 Degenerative disease of nervous system, unspecified: Secondary | ICD-10-CM | POA: Diagnosis not present

## 2012-07-28 DIAGNOSIS — I6350 Cerebral infarction due to unspecified occlusion or stenosis of unspecified cerebral artery: Secondary | ICD-10-CM

## 2012-07-28 DIAGNOSIS — Z79899 Other long term (current) drug therapy: Secondary | ICD-10-CM | POA: Diagnosis not present

## 2012-07-28 DIAGNOSIS — R42 Dizziness and giddiness: Secondary | ICD-10-CM | POA: Diagnosis not present

## 2012-07-28 DIAGNOSIS — R413 Other amnesia: Secondary | ICD-10-CM

## 2012-07-28 DIAGNOSIS — H6120 Impacted cerumen, unspecified ear: Secondary | ICD-10-CM

## 2012-07-28 DIAGNOSIS — H4900 Third [oculomotor] nerve palsy, unspecified eye: Secondary | ICD-10-CM | POA: Diagnosis present

## 2012-07-28 DIAGNOSIS — I959 Hypotension, unspecified: Secondary | ICD-10-CM | POA: Diagnosis present

## 2012-07-28 DIAGNOSIS — F411 Generalized anxiety disorder: Secondary | ICD-10-CM | POA: Diagnosis not present

## 2012-07-28 DIAGNOSIS — K5730 Diverticulosis of large intestine without perforation or abscess without bleeding: Secondary | ICD-10-CM

## 2012-07-28 DIAGNOSIS — D62 Acute posthemorrhagic anemia: Secondary | ICD-10-CM | POA: Diagnosis not present

## 2012-07-28 DIAGNOSIS — M810 Age-related osteoporosis without current pathological fracture: Secondary | ICD-10-CM

## 2012-07-28 DIAGNOSIS — I10 Essential (primary) hypertension: Secondary | ICD-10-CM | POA: Diagnosis present

## 2012-07-28 DIAGNOSIS — K226 Gastro-esophageal laceration-hemorrhage syndrome: Secondary | ICD-10-CM

## 2012-07-28 DIAGNOSIS — J3089 Other allergic rhinitis: Secondary | ICD-10-CM | POA: Diagnosis not present

## 2012-07-28 DIAGNOSIS — I739 Peripheral vascular disease, unspecified: Secondary | ICD-10-CM

## 2012-07-28 DIAGNOSIS — E049 Nontoxic goiter, unspecified: Secondary | ICD-10-CM

## 2012-07-28 DIAGNOSIS — I70209 Unspecified atherosclerosis of native arteries of extremities, unspecified extremity: Secondary | ICD-10-CM | POA: Diagnosis present

## 2012-07-28 DIAGNOSIS — R404 Transient alteration of awareness: Secondary | ICD-10-CM | POA: Diagnosis not present

## 2012-07-28 DIAGNOSIS — E785 Hyperlipidemia, unspecified: Secondary | ICD-10-CM

## 2012-07-28 DIAGNOSIS — K625 Hemorrhage of anus and rectum: Secondary | ICD-10-CM | POA: Diagnosis not present

## 2012-07-28 DIAGNOSIS — M1990 Unspecified osteoarthritis, unspecified site: Secondary | ICD-10-CM

## 2012-07-28 DIAGNOSIS — N3090 Cystitis, unspecified without hematuria: Secondary | ICD-10-CM

## 2012-07-28 DIAGNOSIS — E7800 Pure hypercholesterolemia, unspecified: Secondary | ICD-10-CM | POA: Diagnosis present

## 2012-07-28 DIAGNOSIS — K649 Unspecified hemorrhoids: Secondary | ICD-10-CM

## 2012-07-28 DIAGNOSIS — K922 Gastrointestinal hemorrhage, unspecified: Secondary | ICD-10-CM | POA: Diagnosis not present

## 2012-07-28 DIAGNOSIS — I872 Venous insufficiency (chronic) (peripheral): Secondary | ICD-10-CM

## 2012-07-28 DIAGNOSIS — M542 Cervicalgia: Secondary | ICD-10-CM

## 2012-07-28 DIAGNOSIS — H538 Other visual disturbances: Secondary | ICD-10-CM

## 2012-07-28 HISTORY — DX: Other amnesia: R41.3

## 2012-07-28 HISTORY — DX: Gastro-esophageal laceration-hemorrhage syndrome: K22.6

## 2012-07-28 LAB — COMPREHENSIVE METABOLIC PANEL
ALT: 16 U/L (ref 0–35)
Alkaline Phosphatase: 76 U/L (ref 39–117)
BUN: 79 mg/dL — ABNORMAL HIGH (ref 6–23)
CO2: 25 mEq/L (ref 19–32)
Chloride: 100 mEq/L (ref 96–112)
GFR calc Af Amer: 64 mL/min — ABNORMAL LOW (ref >90)
GFR calc non Af Amer: 56 mL/min — ABNORMAL LOW (ref >90)
Glucose, Bld: 181 mg/dL — ABNORMAL HIGH (ref 70–99)
Potassium: 3.7 mEq/L (ref 3.5–5.1)
Sodium: 136 mEq/L (ref 135–145)
Total Bilirubin: 0.4 mg/dL (ref 0.3–1.2)
Total Protein: 5.8 g/dL — ABNORMAL LOW (ref 6.0–8.3)

## 2012-07-28 LAB — RETICULOCYTES: RBC.: 2.96 MIL/uL — ABNORMAL LOW (ref 3.87–5.11)

## 2012-07-28 LAB — CBC WITH DIFFERENTIAL/PLATELET
Hemoglobin: 9.4 g/dL — ABNORMAL LOW (ref 12.0–15.0)
Lymphocytes Relative: 5 % — ABNORMAL LOW (ref 12–46)
Lymphs Abs: 0.5 10*3/uL — ABNORMAL LOW (ref 0.7–4.0)
MCH: 31.8 pg (ref 26.0–34.0)
Monocytes Relative: 3 % (ref 3–12)
Neutro Abs: 9.5 10*3/uL — ABNORMAL HIGH (ref 1.7–7.7)
Neutrophils Relative %: 92 % — ABNORMAL HIGH (ref 43–77)
Platelets: 284 10*3/uL (ref 150–400)
RBC: 2.96 MIL/uL — ABNORMAL LOW (ref 3.87–5.11)
WBC: 10.3 10*3/uL (ref 4.0–10.5)

## 2012-07-28 LAB — URINALYSIS, ROUTINE W REFLEX MICROSCOPIC
Ketones, ur: NEGATIVE mg/dL
Protein, ur: NEGATIVE mg/dL
Urobilinogen, UA: 0.2 mg/dL (ref 0.0–1.0)

## 2012-07-28 LAB — POCT I-STAT, CHEM 8
Chloride: 103 mEq/L (ref 96–112)
Creatinine, Ser: 1 mg/dL (ref 0.50–1.10)
Glucose, Bld: 181 mg/dL — ABNORMAL HIGH (ref 70–99)
Potassium: 3.8 mEq/L (ref 3.5–5.1)

## 2012-07-28 LAB — APTT: aPTT: 29 seconds (ref 24–37)

## 2012-07-28 LAB — URINE MICROSCOPIC-ADD ON

## 2012-07-28 LAB — IRON AND TIBC
Iron: 133 ug/dL (ref 42–135)
Saturation Ratios: 48 % (ref 20–55)
UIBC: 143 ug/dL (ref 125–400)

## 2012-07-28 LAB — PREPARE RBC (CROSSMATCH)

## 2012-07-28 LAB — FERRITIN: Ferritin: 45 ng/mL (ref 10–291)

## 2012-07-28 LAB — FOLATE: Folate: >20 ng/mL

## 2012-07-28 MED ORDER — ALPRAZOLAM 0.25 MG PO TABS
0.2500 mg | ORAL_TABLET | Freq: Three times a day (TID) | ORAL | Status: DC | PRN
  Administered 2012-07-30: 0.25 mg via ORAL
  Filled 2012-07-28: qty 1

## 2012-07-28 MED ORDER — ONDANSETRON HCL 4 MG/2ML IJ SOLN: 4.0000 mg | Freq: Four times a day (QID) | INTRAMUSCULAR | Status: DC | PRN

## 2012-07-28 MED ORDER — SODIUM CHLORIDE 0.9 % IV SOLN
INTRAVENOUS | Status: DC
  Administered 2012-07-28: 20 mL via INTRAVENOUS

## 2012-07-28 MED ORDER — ONDANSETRON HCL 4 MG PO TABS: 4.0000 mg | ORAL_TABLET | Freq: Four times a day (QID) | ORAL | Status: DC | PRN

## 2012-07-28 MED ORDER — DEXTROSE-NACL 5-0.9 % IV SOLN
INTRAVENOUS | Status: DC
  Administered 2012-07-28: 100 mL/h via INTRAVENOUS
  Administered 2012-07-29 – 2012-07-31 (×3): via INTRAVENOUS

## 2012-07-28 MED ORDER — SODIUM CHLORIDE 0.9 % IV SOLN
Freq: Once | INTRAVENOUS | Status: AC
  Administered 2012-07-28: 14:00:00 via INTRAVENOUS

## 2012-07-28 MED ORDER — PANTOPRAZOLE SODIUM 40 MG IV SOLR
40.0000 mg | INTRAVENOUS | Status: AC
  Administered 2012-07-28: 40 mg via INTRAVENOUS
  Filled 2012-07-28: qty 40

## 2012-07-28 MED ORDER — SODIUM CHLORIDE 0.9 % IJ SOLN
3.0000 mL | Freq: Two times a day (BID) | INTRAMUSCULAR | Status: DC
  Administered 2012-07-28: 3 mL via INTRAVENOUS

## 2012-07-28 MED ORDER — SODIUM CHLORIDE 0.9 % IV BOLUS (SEPSIS)
1000.0000 mL | Freq: Once | INTRAVENOUS | Status: AC
  Administered 2012-07-28: 1000 mL via INTRAVENOUS

## 2012-07-28 MED ORDER — HYDROCODONE-ACETAMINOPHEN 5-325 MG PO TABS
1.0000 | ORAL_TABLET | ORAL | Status: DC | PRN
  Administered 2012-07-30 (×2): 2 via ORAL
  Filled 2012-07-28 (×2): qty 2

## 2012-07-28 MED ORDER — FAMOTIDINE IN NACL 20-0.9 MG/50ML-% IV SOLN
20.0000 mg | INTRAVENOUS | Status: AC
  Administered 2012-07-28: 20 mg via INTRAVENOUS
  Filled 2012-07-28: qty 50

## 2012-07-28 MED ORDER — PANTOPRAZOLE SODIUM 40 MG IV SOLR
40.0000 mg | Freq: Two times a day (BID) | INTRAVENOUS | Status: DC
  Administered 2012-07-28 – 2012-07-29 (×3): 40 mg via INTRAVENOUS
  Filled 2012-07-28 (×4): qty 40

## 2012-07-28 NOTE — Progress Notes (Signed)
Attempted to assisted patient to bedside commode, upon sitting on side of bed patient became very weak and c/o dizziness. At that time patient became pale, drowsy and BP dropped 86/61 returned patient back to bed and repositioned with assist x 1. Repeated BP 127/103. Will notify MD and continue to monitor.

## 2012-07-28 NOTE — Consult Note (Addendum)
[name] Consultation  Requesting Provider: [facility] Primary Care Physician:  Carol Mcalpine, MD Primary Gastroenterologist:  none  Reason for Consultation:  Hematemesis, melena - GI bleed  Assessment:  Upper GI bleeding as above, on Plavix, probably orthostatic - suspect (given BUN #) she has been bleeding longer than today only    Acute blood loss anemia     Recommendations: IVF bolus and continuous IVF    Serial Hgb, transfuse if next Hgb < 8 (would not be surprised)    IV PPI    EGD tomorrow The risks and benefits as well as alternatives of endoscopic procedure(s) have been discussed and reviewed. All questions answered. The patient agrees to proceed.    HPI: [name] is an 79 y.o. female who is admitted after a new onset of red hematemesis today. She says she tool 2 Plavix by mistake today. Also says that she was recently told to take ASA 81 mg. Denies NSAID's. She is having melena per hx at least a few times, not noticed until today.and by ED MD rectal exam. No pain. Is weak and  light headed when standing and RN says she was unable to have her stand without assistance. She does not recall having had GI problems or work-up before. Plavix is used because of hx of stroke and PVD. Currently trying to void but unable to on bedpan. Says always difficult. GI ROS o/w negative.   Past Medical History  Diagnosis Date  . Paralytic strabismus, third or oculomotor nerve palsy, partial   . Allergic rhinitis due to other allergen   . Mild hypertension   . Mitral valve prolapse   . Peripheral vascular disease   . Hypercholesteremia   . Goiter, unspecified   . Diverticulosis of colon   . Hemorrhoids   . Cystitis, unspecified   . Osteoarthritis   . Osteoporosis   . CVA (cerebral infarction)   . Cerebral atrophy   . Anxiety     Past Surgical History  Procedure Laterality Date  . Breast lumpectomy  1960's    benign breast biopsy  . Cholecystectomy   1980    at [facility]  Marland Kitchen Appendectomy  1980    at [facility]  . Abdominal hysterectomy  07/1980    hyst and rectocele repair by Dr. Dewaine Conger  . Oculoplastic eye surgery  12/1997    in [place]  . Vv stripping years ago      Prior to Admission medications   Medication Sig Start Date End Date Taking? Authorizing Provider  ALPRAZolam Prudy Feeler) 0.25 MG tablet Take 1/2 to 1 tablet by mouth three times daily as needed for nerves    Yes Historical Provider, MD  amLODipine (NORVASC) 10 MG tablet Take 1 tablet (10 mg total) by mouth daily. 03/22/12  Yes Carol Mcalpine, MD  aspirin 81 MG tablet Take 81 mg by mouth daily.   Yes Historical Provider, MD  Bioflavonoid Products (ESTER C PO) Take 1 tablet by mouth daily.     Yes Historical Provider, MD  calcium gluconate 500 MG tablet Take 500 mg by mouth daily.    Yes Historical Provider, MD  Cholecalciferol (VITAMIN D) 1000 UNITS capsule Take 1,000 Units by mouth daily.     Yes Historical Provider, MD  clopidogrel (PLAVIX) 75 MG tablet Take 1 tablet (75 mg total) by mouth daily. 02/14/12  Yes Carol Mcalpine, MD  fish oil-omega-3 fatty acids 1000 MG capsule Take 2 g by mouth daily.  Yes Historical Provider, MD  Glucosamine HCl 1000 MG TABS Take 1 tablet by mouth daily.     Yes Historical Provider, MD  losartan (COZAAR) 50 MG tablet Take 1 tablet (50 mg total) by mouth daily. 07/29/11 07/28/12 Yes Carol Mcalpine, MD  Multiple Vitamin (MULTIVITAMIN PO) Take 1 tablet by mouth daily.     Yes Historical Provider, MD  Psyllium (METAMUCIL) 30.9 % POWD 1 tsp daily    Yes Historical Provider, MD    Current Facility-Administered Medications  Medication Dose Route Frequency Provider Last Rate Last Dose  . ALPRAZolam (XANAX) tablet 0.25 mg  0.25 mg Oral TID PRN Dorothea Ogle, MD      . dextrose 5 %-0.9 % sodium chloride infusion   Intravenous Continuous Iva Boop, MD      . HYDROcodone-acetaminophen (NORCO/VICODIN) 5-325 MG per tablet 1-2 tablet  1-2 tablet Oral Q4H  PRN Dorothea Ogle, MD      . ondansetron [facility]) tablet 4 mg  4 mg Oral Q6H PRN Dorothea Ogle, MD       Or  . ondansetron [facility]) injection 4 mg  4 mg Intravenous Q6H PRN Dorothea Ogle, MD      . pantoprazole (PROTONIX) injection 40 mg  40 mg Intravenous Q12H Dorothea Ogle, MD      . sodium chloride 0.9 % bolus 1,000 mL  1,000 mL Intravenous Once Iva Boop, MD      . sodium chloride 0.9 % injection 3 mL  3 mL Intravenous Q12H Dorothea Ogle, MD        Allergies as of 07/28/2012 - Review Complete 07/28/2012  Allergen Reaction Noted  . Augmentin (amoxicillin-pot clavulanate)  05/14/2012  . Septra (sulfamethoxazole-tmp ds)  05/14/2012  . Shellfish allergy  05/14/2012  . Sulfamethoxazole w-trimethoprim Other (See Comments)   . Amoxicillin-pot clavulanate Other (See Comments)     History reviewed. No pertinent family history.  History   Social History  . Marital Status: Widowed    Spouse Name: N/A    Number of Children: 3  . Years of Education: N/A   Occupational History  . 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 Smoking status: Never Smoker   . Smokeless tobacco: Never Used  . Alcohol Use: No  . Drug Use: No  . Sexually Active: No    Review of Systems: All other review of systems negative except as mentioned in the HPI.  Physical Exam: Vital signs in last 24 hours: Temp:  [97.7 F (36.5 C)] 97.7 F (36.5 C) (05/03 1606) Pulse Rate:  [80-90] 90 (05/03 1606) Resp:  [16-20] 17 (05/03 1530) BP: (102-129)/(40-52) 119/40 mmHg (05/03 1606) SpO2:  [97 %-100 %] 97 % (05/03 1606) Weight:  [101 lb 6.4 oz (45.995 kg)] 101 lb 6.4 oz (45.995 kg) (05/03 1606)   General:   Alert,  Well-developed, well-nourished, pleasant and cooperative in NAD Eyes:  Sclera clear, no icterus.   Conjunctiva pink. Mouth:  No deformity or lesions.  Oropharynx pink & moist. Neck:  Supple; no masses or thyromegaly. Lungs:  Clear throughout to auscultation.    Heart:  Regular rate and rhythm; no murmurs, clicks, rubs,  or gallops. Abdomen:  Soft, nontender and nondistended. No masses, hepatosplenomegaly or hernias noted. Normal bowel sounds, without guarding, and without rebound.   Extremities:  Without clubbing or edema. Neurologic:  Alert oriented to person not place or time Lymph  Nodes:  No significant cervical or supraclavicular adenopathy. Psych:  Alert and cooperative. Normal mood and affect.  Lab Results:  Recent Labs  07/28/12 1328 07/28/12 1336  WBC 10.3  --   HGB 9.4* 9.2*  HCT 27.8* 27.0*  PLT 284  --    BMET  Recent Labs  07/28/12 1328 07/28/12 1336  NA 136 138  K 3.7 3.8  CL 100 103  CO2 25  --   GLUCOSE 181* 181*  BUN 79* 86*  CREATININE 0.91 1.00  CALCIUM 9.2  --    LFT  Recent Labs  07/28/12 1328  PROT 5.8*  ALBUMIN 3.0*  AST 17  ALT 16  ALKPHOS 76  BILITOT 0.4   PT/INR  Recent Labs  07/28/12 1328  LABPROT 13.6  INR 1.05    LOS: 0 days    @Carl  Sena Slate, MD, Antionette Fairy Koch [phone] (pager) 07/28/2012 4:52 PM@

## 2012-07-28 NOTE — ED Provider Notes (Signed)
History     CSN: [id number]  Arrival date & time 07/28/12  1250   First MD Initiated Contact with Patient 07/28/12 1255      Chief Complaint  Patient presents with  . Rectal Bleeding  . Hematemesis  . Dizziness    (Consider location/radiation/quality/duration/timing/severity/associated sxs/prior treatment) HPI Comments: 79 year old female who takes Plavix, she is unsure why she takes the Plavix but thinks is because of her peripheral vascular disease. She states that yesterday she took a second dose of Plavix in the same day thinking that it would work better and afterwards felt like she had been vomiting some bright red blood and having dark stools. The stools appear black, she has had several stools in the last 24 hours like this but has no abdominal pain. This morning she had some associated lightheadedness. The symptoms are intermittent, gradually worsening, nothing seems to make it better or worse, she denies using prednisone or other anti-inflammatories, denies history of EGD and denies history of known stomach ulcers or GERD.  Patient is a 79 y.o. female presenting with hematochezia. The history is provided by the patient and the EMS personnel.  Rectal Bleeding     Past Medical History  Diagnosis Date  . Paralytic strabismus, third or oculomotor nerve palsy, partial   . Allergic rhinitis due to other allergen   . Mild hypertension   . Mitral valve prolapse   . Peripheral vascular disease   . Hypercholesteremia   . Goiter, unspecified   . Diverticulosis of colon   . Hemorrhoids   . Cystitis, unspecified   . Osteoarthritis   . Osteoporosis   . CVA (cerebral infarction)   . Cerebral atrophy   . Anxiety     Past Surgical History  Procedure Laterality Date  . Breast lumpectomy  1960's    benign breast biopsy  . Cholecystectomy  1980    at [facility]  Marland Kitchen Appendectomy  1980    at [facility]  . Abdominal hysterectomy  07/1980    hyst and rectocele repair by Dr. Dewaine Conger   . Oculoplastic eye surgery  12/1997    in [place]  . Vv stripping years ago      No family history on file.  History  Substance Use Topics  . Smoking status: Never Smoker   . Smokeless tobacco: Never Used  . Alcohol Use: No    OB History   Grav Para Term Preterm Abortions TAB SAB Ect Mult Living                  Review of Systems  Gastrointestinal: Positive for hematochezia.  All other systems reviewed and are negative.    Allergies  Augmentin; Septra; Shellfish allergy; Sulfamethoxazole w-trimethoprim; and Amoxicillin-pot clavulanate  Home Medications   Current Outpatient Rx  Name  Route  Sig  Dispense  Refill  . ALPRAZolam (XANAX) 0.25 MG tablet      Take 1/2 to 1 tablet by mouth three times daily as needed for nerves          . amLODipine (NORVASC) 10 MG tablet   Oral   Take 1 tablet (10 mg total) by mouth daily.   30 tablet   11   . aspirin 81 MG tablet   Oral   Take 81 mg by mouth daily.         Marland Kitchen Bioflavonoid Products (ESTER C PO)   Oral   Take 1 tablet by mouth daily.           Marland Kitchen  calcium gluconate 500 MG tablet   Oral   Take 500 mg by mouth daily.          . Cholecalciferol (VITAMIN D) 1000 UNITS capsule   Oral   Take 1,000 Units by mouth daily.           . clopidogrel (PLAVIX) 75 MG tablet   Oral   Take 1 tablet (75 mg total) by mouth daily.   30 tablet   0   . fish oil-omega-3 fatty acids 1000 MG capsule   Oral   Take 2 g by mouth daily.           . Glucosamine HCl 1000 MG TABS   Oral   Take 1 tablet by mouth daily.           Marland Kitchen losartan (COZAAR) 50 MG tablet   Oral   Take 1 tablet (50 mg total) by mouth daily.   30 tablet   5   . Multiple Vitamin (MULTIVITAMIN PO)   Oral   Take 1 tablet by mouth daily.           . Psyllium (METAMUCIL) 30.9 % POWD      1 tsp daily            BP 104/43  Pulse 84  Temp(Src) 97.7 F (36.5 C) (Oral)  Resp 17  SpO2 97%  Physical Exam  Nursing note and vitals  reviewed. Constitutional: She appears well-developed and well-nourished. No distress.  HENT:  Head: Normocephalic and atraumatic.  Mouth/Throat: Oropharynx is clear and moist. No oropharyngeal exudate.  Eyes: Conjunctivae and EOM are normal. Pupils are equal, round, and reactive to light. Right eye exhibits no discharge. Left eye exhibits no discharge. No scleral icterus.  Neck: Normal range of motion. Neck supple. No JVD present. No thyromegaly present.  Cardiovascular: Normal rate, regular rhythm, normal heart sounds and intact distal pulses.  Exam reveals no gallop and no friction rub.   No murmur heard. Pulmonary/Chest: Effort normal and breath sounds normal. No respiratory distress. She has no wheezes. She has no rales.  Abdominal: Soft. Bowel sounds are normal. She exhibits no distension and no mass. There is no tenderness.  Genitourinary:  Chaperone present for rectal exam, one small nontender hemorrhoid, nonbleeding, nonthrombosed. No fissures, no amount of stools in the rectal vault.  Musculoskeletal: Normal range of motion. She exhibits no edema and no tenderness.  Lymphadenopathy:    She has no cervical adenopathy.  Neurological: She is alert. Coordination normal.  Skin: Skin is warm and dry. No rash noted. No erythema.  Psychiatric: She has a normal mood and affect. Her behavior is normal.    ED Course  Procedures (including critical care time)  Labs Reviewed  CBC WITH DIFFERENTIAL - Abnormal; Notable for the following:    RBC 2.96 (*)    Hemoglobin 9.4 (*)    HCT 27.8 (*)    Neutrophils Relative 92 (*)    Neutro Abs 9.5 (*)    Lymphocytes Relative 5 (*)    Lymphs Abs 0.5 (*)    All other components within normal limits  COMPREHENSIVE METABOLIC PANEL - Abnormal; Notable for the following:    Glucose, Bld 181 (*)    BUN 79 (*)    Total Protein 5.8 (*)    Albumin 3.0 (*)    GFR calc non Af Amer 56 (*)    GFR calc Af Amer 64 (*)    All other components within normal  limits  RETICULOCYTES - Abnormal; Notable for the following:    RBC. 2.96 (*)    All other components within normal limits  OCCULT BLOOD, POC DEVICE - Abnormal; Notable for the following:    Fecal Occult Bld POSITIVE (*)    All other components within normal limits  POCT I-STAT, CHEM 8 - Abnormal; Notable for the following:    BUN 86 (*)    Glucose, Bld 181 (*)    Hemoglobin 9.2 (*)    HCT 27.0 (*)    All other components within normal limits  APTT  PROTIME-INR  VITAMIN B12  FOLATE  IRON AND TIBC  FERRITIN  TYPE AND SCREEN   No results found.   1. GI bleed       MDM  At this time the patient appears to have a rectal bleed which I suspect is upper GI given her symptoms of vomiting bright red blood, on antiplatelet therapy with dark melanotic stools. She is hemodynamically stable, will acquire labs and GI evaluation.  Hemoccult positive on my exam.  D/w Dr. Leone Payor who will see in consultation.    Hgb drop ~ 4 grams,   Pepcid / Protonix ordered and given.    Discussed with hospitalist Dr. Izola Price who will admit.      Vida Roller, MD 07/28/12 1501

## 2012-07-28 NOTE — Progress Notes (Signed)
Paged  Dr. Izola Price in regards to patients dizzy spell and inability to void.  In and out cath done and 700 cc of pink non odorous urine returned.  New orders received will continue to monitor.

## 2012-07-28 NOTE — ED Notes (Signed)
Pt sts she took the additional Plavix, because she thought it would "fix whatever I take it for."    No active bleeding noted.

## 2012-07-28 NOTE — H&P (Signed)
[facility] History and Physical  [name] WGN:[id number] DOB: 01-Jun-1924 DOA: 07/28/2012  Referring physician: ED physician PCP: Michele Mcalpine, MD   Chief Complaint: Hematemesis and blood in stool  HPI:  Pt is 79 yo female with history of CVA and PVD (on Plavix), who presents to [facility] ED with main concern of sudden onset hematemesis and blood per rectum that she noticed one day prior to admission, associated with lightheadedness and generalized weakness. She explains that she has taken by mistake extra dose of Plavix. She reports no similar events in the past, no chest pain or shortness of breath, no specific abdominal or urinary concerns. In ED, pt with persistent dizziness and unable to stand up as a result, Hg ~9 (at baseline Hg ~12-13). On rectal exam, FOBT +. TRH asked to admit for further evaluation. GI consulted by ED physician.   Assessment and Plan:  Active Problems:   Acute blood loss anemia - rectal bleed and hematemesis - appreciate GI consultation, will follow up on their recommendations  - plan for endoscopy in AM, CBC for 7 pm tonight pending  - CBC In AM   HYPERTENSION, MILD - pt is actually hypotensive on admission - will hold home BP medications Norvasc and Losartan   CVA - on Plavix, will hold for now until etiology of bleeding established   Code Status: Full Family Communication: Pt at bedside Disposition Plan: Admit to telemetry bed   Review of Systems:  Constitutional: Negative for fever, chills and malaise/fatigue. Negative for diaphoresis.  HENT: Negative for hearing loss, ear pain, nosebleeds, congestion, sore throat, neck pain, tinnitus and ear discharge.   Eyes: Negative for blurred vision, double vision, photophobia, pain, discharge and redness.  Respiratory: Negative for cough, hemoptysis, sputum production, shortness of breath, wheezing and stridor.   Cardiovascular: Negative for chest pain, palpitations, orthopnea, claudication and  leg swelling.  Gastrointestinal: Negative for nausea, vomiting and abdominal pain. Negative for heartburn, constipation  Genitourinary: Negative for dysuria, urgency, frequency, hematuria and flank pain.  Musculoskeletal: Negative for myalgias, back pain, joint pain and falls.  Skin: Negative for itching and rash.  Neurological: Negative for tingling, tremors, sensory change, speech change, focal weakness, loss of consciousness and headaches.  Endo/Heme/Allergies: Negative for environmental allergies and polydipsia. Does not bruise/bleed easily.  Psychiatric/Behavioral: Negative for suicidal ideas. The patient is not nervous/anxious.      Past Medical History  Diagnosis Date  . Paralytic strabismus, third or oculomotor nerve palsy, partial   . Allergic rhinitis due to other allergen   . Mild hypertension   . Mitral valve prolapse   . Peripheral vascular disease   . Hypercholesteremia   . Goiter, unspecified   . Diverticulosis of colon   . Hemorrhoids   . Cystitis, unspecified   . Osteoarthritis   . Osteoporosis   . CVA (cerebral infarction)   . Cerebral atrophy   . Anxiety     Past Surgical History  Procedure Laterality Date  . Breast lumpectomy  1960's    benign breast biopsy  . Cholecystectomy  1980    at [facility]  Marland Kitchen Appendectomy  1980    at [facility]  . Abdominal hysterectomy  07/1980    hyst and rectocele repair by Dr. Dewaine Conger  . Oculoplastic eye surgery  12/1997    in [place]  . Vv stripping years ago      Social History:  reports that she has never smoked. She has never used smokeless tobacco. She reports that  she does not drink alcohol or use illicit drugs.  Allergies  Allergen Reactions  . Augmentin (Amoxicillin-Pot Clavulanate)   . Septra (Sulfamethoxazole-Tmp Ds)   . Shellfish Allergy   . Sulfamethoxazole W-Trimethoprim Other (See Comments)    allergy to Sulfa w/ flu-like illness.  . Amoxicillin-Pot Clavulanate Other (See Comments)     "fuzzy-headed"     Family history of HTN  Prior to Admission medications   Medication Sig Start Date End Date Taking? Authorizing Provider  ALPRAZolam Prudy Feeler) 0.25 MG tablet Take 1/2 to 1 tablet by mouth three times daily as needed for nerves    Yes Historical Provider, MD  amLODipine (NORVASC) 10 MG tablet Take 1 tablet (10 mg total) by mouth daily. 03/22/12  Yes Michele Mcalpine, MD  aspirin 81 MG tablet Take 81 mg by mouth daily.   Yes Historical Provider, MD  Bioflavonoid Products (ESTER C PO) Take 1 tablet by mouth daily.     Yes Historical Provider, MD  calcium gluconate 500 MG tablet Take 500 mg by mouth daily.    Yes Historical Provider, MD  Cholecalciferol (VITAMIN D) 1000 UNITS capsule Take 1,000 Units by mouth daily.     Yes Historical Provider, MD  clopidogrel (PLAVIX) 75 MG tablet Take 1 tablet (75 mg total) by mouth daily. 02/14/12  Yes Michele Mcalpine, MD  fish oil-omega-3 fatty acids 1000 MG capsule Take 2 g by mouth daily.     Yes Historical Provider, MD  Glucosamine HCl 1000 MG TABS Take 1 tablet by mouth daily.     Yes Historical Provider, MD  losartan (COZAAR) 50 MG tablet Take 1 tablet (50 mg total) by mouth daily. 07/29/11 07/28/12 Yes Michele Mcalpine, MD  Multiple Vitamin (MULTIVITAMIN PO) Take 1 tablet by mouth daily.     Yes Historical Provider, MD  Psyllium (METAMUCIL) 30.9 % POWD 1 tsp daily    Yes Historical Provider, MD    Physical Exam: Filed Vitals:   07/28/12 1255 07/28/12 1330 07/28/12 1400  BP: 108/46 129/52 103/43  Pulse: 80 82 83  Temp: 97.7 F (36.5 C)    TempSrc: Oral    Resp: 16 16 16   SpO2: 100% 99% 98%    Physical Exam  Constitutional: Appears well-developed and well-nourished. No distress.  HENT: Normocephalic. External right and left ear normal. Oropharynx is clear and moist.  Eyes: Conjunctivae and EOM are normal. PERRLA, no scleral icterus.  Neck: Normal ROM. Neck supple. No JVD. No tracheal deviation. No thyromegaly.  CVS: RRR, S1/S2 +, no murmurs, no gallops,  no carotid bruit.  Pulmonary: Effort and breath sounds normal, no stridor, rhonchi, wheezes, rales.  Abdominal: Soft. BS +,  no distension, tenderness, rebound or guarding.  Musculoskeletal: Normal range of motion. No edema and no tenderness.  Lymphadenopathy: No lymphadenopathy noted, cervical, inguinal. Neuro: Alert. Normal reflexes, muscle tone coordination. No cranial nerve deficit. Skin: Skin is warm and dry. No rash noted. Not diaphoretic. No erythema. No pallor.  Psychiatric: Normal mood and affect. Behavior, judgment, thought content normal.   Labs on Admission:  Basic Metabolic Panel:  Recent Labs Lab 07/28/12 1328 07/28/12 1336  NA 136 138  K 3.7 3.8  CL 100 103  CO2 25  --   GLUCOSE 181* 181*  BUN 79* 86*  CREATININE 0.91 1.00  CALCIUM 9.2  --    Liver Function Tests:  Recent Labs Lab 07/28/12 1328  AST 17  ALT 16  ALKPHOS 76  BILITOT 0.4  PROT 5.8*  ALBUMIN 3.0*   CBC:  Recent Labs Lab 07/28/12 1328 07/28/12 1336  WBC 10.3  --   NEUTROABS 9.5*  --   HGB 9.4* 9.2*  HCT 27.8* 27.0*  MCV 93.9  --   PLT 284  --    Radiological Exams on Admission: No results found.  EKG: Normal sinus rhythm, no ST/T wave changes  Debbora Presto, MD  [facility] Pager [phone]  If 7PM-7AM, please contact night-coverage [email or web address] Password [email or web address] 07/28/2012, 2:58 PM

## 2012-07-28 NOTE — ED Notes (Signed)
MD at bedside. 

## 2012-07-28 NOTE — ED Notes (Addendum)
Per EMS, Pt, from home, c/o vomiting bright red blood x 2, last night and bloody stool x 2, last night and today.  Denies pain.  Takes Plavix and took a double dose yesterday.  Vitals are stable.  A & O.  Pt sts dizziness upon standing.

## 2012-07-28 NOTE — Progress Notes (Signed)
[facility] progress note. Chief complaint. Transfuse will anemia. This 79 year old  female in hospital with acute blood loss anemia had a hemoglobin result low at 6.5. Despite nursing best efforts they were unable to contact any family members to obtain blood transfusion consent. The patient is felt to be too confused to give informed consent personally. Because the blood transfusion is a medical necessity I did authorize transfusion of the packed red blood cells.

## 2012-07-28 NOTE — ED Notes (Signed)
[id number]<AV> Expected date:07/28/12<BR> Expected time:12:49 PM<BR> Means of arrival:<BR> Comments:<BR> Took extra Plavix yesterday

## 2012-07-28 NOTE — Progress Notes (Signed)
CRITICAL VALUE ALERT  Critical value received: hgb 6.5  Date of notification:  07/28/12  Time of notification:  1915  Critical value read back:yes  Nurse who received alert:  b Zerick Prevette   MD notified (1st page):  Benedetto Coons  Time of first page:  1915  MD notified (2nd page):  Time of second page:  Responding MD: Benedetto Coons Time MD responded:  [phone]

## 2012-07-29 ENCOUNTER — Encounter (HOSPITAL_COMMUNITY): Payer: Self-pay | Admitting: Internal Medicine

## 2012-07-29 ENCOUNTER — Encounter (HOSPITAL_COMMUNITY)
Admission: EM | Disposition: A | Discharge status: Home / Self Care | Payer: Self-pay | Source: Home / Self Care | Attending: Internal Medicine

## 2012-07-29 DIAGNOSIS — F411 Generalized anxiety disorder: Secondary | ICD-10-CM | POA: Diagnosis not present

## 2012-07-29 DIAGNOSIS — K226 Gastro-esophageal laceration-hemorrhage syndrome: Principal | ICD-10-CM

## 2012-07-29 DIAGNOSIS — D62 Acute posthemorrhagic anemia: Secondary | ICD-10-CM | POA: Diagnosis not present

## 2012-07-29 DIAGNOSIS — K922 Gastrointestinal hemorrhage, unspecified: Secondary | ICD-10-CM | POA: Diagnosis not present

## 2012-07-29 DIAGNOSIS — J3089 Other allergic rhinitis: Secondary | ICD-10-CM | POA: Diagnosis not present

## 2012-07-29 HISTORY — PX: ESOPHAGOGASTRODUODENOSCOPY: SHX5428

## 2012-07-29 HISTORY — DX: Gastro-esophageal laceration-hemorrhage syndrome: K22.6

## 2012-07-29 LAB — BASIC METABOLIC PANEL
BUN: 52 mg/dL — ABNORMAL HIGH (ref 6–23)
CO2: 27 mEq/L (ref 19–32)
Chloride: 110 mEq/L (ref 96–112)
Creatinine, Ser: 0.85 mg/dL (ref 0.50–1.10)
GFR calc Af Amer: 70 mL/min — ABNORMAL LOW (ref >90)
Potassium: 3.7 mEq/L (ref 3.5–5.1)

## 2012-07-29 LAB — CBC
HCT: 24.3 % — ABNORMAL LOW (ref 36.0–46.0)
Hemoglobin: 8.1 g/dL — ABNORMAL LOW (ref 12.0–15.0)
MCV: 88.7 fL (ref 78.0–100.0)
RDW: 14.4 % (ref 11.5–15.5)
WBC: 7.9 10*3/uL (ref 4.0–10.5)

## 2012-07-29 SURGERY — EGD (ESOPHAGOGASTRODUODENOSCOPY)
Anesthesia: Moderate Sedation

## 2012-07-29 MED ORDER — PANTOPRAZOLE SODIUM 40 MG PO TBEC
40.0000 mg | DELAYED_RELEASE_TABLET | Freq: Two times a day (BID) | ORAL | Status: DC
  Administered 2012-07-29 – 2012-07-31 (×4): 40 mg via ORAL
  Filled 2012-07-29 (×5): qty 1

## 2012-07-29 MED ORDER — FERROUS SULFATE 325 (65 FE) MG PO TABS
325.0000 mg | ORAL_TABLET | Freq: Two times a day (BID) | ORAL | Status: DC
  Administered 2012-07-29 – 2012-07-31 (×4): 325 mg via ORAL
  Filled 2012-07-29 (×6): qty 1

## 2012-07-29 MED ORDER — PANTOPRAZOLE SODIUM 40 MG PO TBEC: 40.0000 mg | DELAYED_RELEASE_TABLET | Freq: Every day | ORAL | Status: DC

## 2012-07-29 MED ORDER — BUTAMBEN-TETRACAINE-BENZOCAINE 2-2-14 % EX AERO
INHALATION_SPRAY | CUTANEOUS | Status: DC | PRN
  Administered 2012-07-29: 2 via TOPICAL

## 2012-07-29 MED ORDER — FENTANYL CITRATE 0.05 MG/ML IJ SOLN
INTRAMUSCULAR | Status: DC | PRN
  Administered 2012-07-29: 25 ug via INTRAVENOUS

## 2012-07-29 MED ORDER — MIDAZOLAM HCL 10 MG/2ML IJ SOLN
INTRAMUSCULAR | Status: DC | PRN
  Administered 2012-07-29: 2 mg via INTRAVENOUS

## 2012-07-29 NOTE — Progress Notes (Signed)
Patient ID: [name], female   DOB: 28-May-1924, 79 y.o.   MRN: [id number]  [facility] PROGRESS NOTE  [name] WUJ:[id number] DOB: 1924/10/03 DOA: 07/28/2012 PCP: Michele Mcalpine, MD  Brief narrative: HPI:  Pt is 79 yo female with history of CVA and PVD (on Plavix), who presents to [facility] ED with main concern of sudden onset hematemesis and blood per rectum that she noticed one day prior to admission, associated with lightheadedness and generalized weakness. She explains that she has taken by mistake extra dose of Plavix. She reports no similar events in the past, no chest pain or shortness of breath, no specific abdominal or urinary concerns.   In ED, pt with persistent dizziness and unable to stand up as a result, Hg ~9 (at baseline Hg ~12-13). On rectal exam, FOBT +. [facility] asked to admit for further evaluation. GI consulted by ED physician.   Assessment and Plan:  Active Problems:  Acute blood loss anemia  - secondary to Mallory - Weiss tear as per endoscopy results - pt has received one unit of PRBC 05/40/2014 as Hg dropped from 9.2 --> 6.5 earlier this AM - appreciate GI consultation - recommendation is to hold Plavix until 08/12/2012 and to continue low-dose ASA (81 mg) if necessary  - also recommended daily PPI, iron and possible d/c in 1-2 days if stable  HYPERTENSION, MILD  - BP still soft - will continue to hold home BP medications Norvasc and Losartan  CVA  - on Plavix, will hold for now until etiology of bleeding established   Consultants:  GI Procedures/Studies:  EGD 07/29/2012 --> Mallory-Weiss tear in the cardia, the remainder of the upper endoscopy exam was otherwise normal. (Dr. Leone Payor) Antibiotics:  None  Code Status: Full Family Communication: Pt at bedside Disposition Plan: Home when medically stable  HPI/Subjective: No events overnight.   Objective: Filed Vitals:   07/29/12 1200 07/29/12 1215 07/29/12 1230 07/29/12 1232  BP: 93/36  87/38 110/52 105/51  Pulse: 77 78 78 77  Temp: 97.5 F (36.4 C)     TempSrc: Oral     Resp: 28 18 19 15   Height:      Weight:      SpO2: 99% 100% 98% 97%    Intake/Output Summary (Last 24 hours) at 07/29/12 1247 Last data filed at 07/29/12 1200  Gross per 24 hour  Intake 2081.33 ml  Output   1751 ml  Net 330.33 ml    Exam:   General:  Pt is alert, follows commands appropriately, not in acute distress  Cardiovascular: Regular rate and rhythm, S1/S2, no murmurs, no rubs, no gallops  Respiratory: Clear to auscultation bilaterally, no wheezing, no crackles, no rhonchi  Abdomen: Soft, non tender, non distended, bowel sounds present, no guarding  Extremities: No edema, pulses DP and PT palpable bilaterally  Neuro: Grossly nonfocal  Data Reviewed: Basic Metabolic Panel:  Recent Labs Lab 07/28/12 1328 07/28/12 1336 07/29/12 0515  NA 136 138 141  K 3.7 3.8 3.7  CL 100 103 110  CO2 25  --  27  GLUCOSE 181* 181* 110*  BUN 79* 86* 52*  CREATININE 0.91 1.00 0.85  CALCIUM 9.2  --  8.2*   Liver Function Tests:  Recent Labs Lab 07/28/12 1328  AST 17  ALT 16  ALKPHOS 76  BILITOT 0.4  PROT 5.8*  ALBUMIN 3.0*   CBC:  Recent Labs Lab 07/28/12 1328 07/28/12 1336 07/28/12 1855 07/29/12 0515  WBC 10.3  --   --  7.9  NEUTROABS 9.5*  --   --   --   HGB 9.4* 9.2* 6.5* 8.1*  HCT 27.8* 27.0*  --  24.3*  MCV 93.9  --   --  88.7  PLT 284  --   --  139*   Scheduled Meds: . [START ON 07/30/2012] pantoprazole  40 mg Oral QAC breakfast  . sodium chloride  3 mL Intravenous Q12H   Continuous Infusions: . dextrose 5 % and 0.9% NaCl Stopped (07/29/12 1116)   Debbora Presto, MD  [facility] Pager [phone]  If 7PM-7AM, please contact night-coverage [email or web address] Password [email or web address] 07/29/2012, 12:47 PM   LOS: 1 day

## 2012-07-29 NOTE — Evaluation (Signed)
Physical Therapy Evaluation Patient Details Name: [name] MRN: [id number] DOB: 1925/03/07 Today's Date: 07/29/2012 Time: 4098-1191 PT Time Calculation (min): 24 min  PT Assessment / Plan / Recommendation Clinical Impression  79 y.o. female admitted with blood in stool and hematemesis, acute blood loss anemia. Pt received 2 units of blood yesterday. At baseline she is independent with mobiltiy. Today she walked 200' with supervision, very mild unsteadiness when walking, no LOB. Expect pt can return home. LIkely no f/u PT needed. Will follow acutely to maximize safety and independence with mobility.     PT Assessment  Patient needs continued PT services    Follow Up Recommendations  No PT follow up    Does the patient have the potential to tolerate intense rehabilitation      Barriers to Discharge None      Equipment Recommendations  None recommended by PT    Recommendations for Other Services     Frequency Min 3X/week    Precautions / Restrictions Precautions Precautions: None Restrictions Weight Bearing Restrictions: No   Pertinent Vitals/Pain **Pt denies pain.  BP sitting 142/56, standing 133/66 No dizziness reported*      Mobility  Bed Mobility Bed Mobility: Supine to Sit Supine to Sit: 6: Modified independent (Device/Increase time);With rails;HOB elevated Transfers Transfers: Sit to Stand;Stand to Sit Sit to Stand: 5: Supervision;From bed;With upper extremity assist Stand to Sit: 5: Supervision;To chair/3-in-1;With armrests;With upper extremity assist Details for Transfer Assistance: supervision 2* h/o dizziness yesterday. pt reports no dizziness today. Ambulation/Gait Ambulation/Gait Assistance: 5: Supervision;4: Min guard Ambulation Distance (Feet): 200 Feet Assistive device: None Ambulation/Gait Assistance Details: 47' with min/guard assist 2* dizziness yesterday, 100' with supervision, pt very mildly unsteady Gait Pattern: Within Functional  Limits Gait velocity: WNL    Exercises     PT Diagnosis: Generalized weakness  PT Problem List: Decreased balance;Decreased activity tolerance PT Treatment Interventions: Gait training;Balance training;Patient/family education;Therapeutic exercise   PT Goals Acute Rehab PT Goals PT Goal Formulation: With patient Time For Goal Achievement: 08/12/12 Potential to Achieve Goals: Good Pt will go Sit to Stand: Independently;without upper extremity assist PT Goal: Sit to Stand - Progress: Goal set today Pt will Ambulate: >150 feet;Independently PT Goal: Ambulate - Progress: Goal set today Pt will Perform Home Exercise Program: Independently PT Goal: Perform Home Exercise Program - Progress: Goal set today  Visit Information  Last PT Received On: 07/29/12 Assistance Needed: +1    Subjective Data  Subjective: I overdosed on Plavix.  Patient Stated Goal: return to working out at gym   Prior Functioning  Home Living Lives With: Alone Type of Home: House Home Layout: Two level;Able to live on main level with bedroom/bathroom Alternate Level Stairs-Number of Steps: flight Bathroom Shower/Tub: Walk-in shower;Tub/shower unit Bathroom Toilet: Standard Home Adaptive Equipment: Straight cane Additional Comments: normally is very active, works out at gym Prior Function Level of Independence: Independent Able to Take Stairs?: Yes Driving: Yes Communication Communication: No difficulties    Cognition  Cognition Arousal/Alertness: Awake/alert Behavior During Therapy: WFL for tasks assessed/performed Overall Cognitive Status: Within Functional Limits for tasks assessed    Extremity/Trunk Assessment Right Upper Extremity Assessment RUE ROM/Strength/Tone: [facility] for tasks assessed Left Upper Extremity Assessment LUE ROM/Strength/Tone: WFL for tasks assessed Right Lower Extremity Assessment RLE ROM/Strength/Tone: Within functional levels RLE Sensation: WFL - Light Touch RLE  Coordination: WFL - gross/fine motor Left Lower Extremity Assessment LLE ROM/Strength/Tone: Within functional levels LLE Sensation: WFL - Light Touch LLE Coordination: WFL - gross/fine motor  Balance Balance Balance Assessed: Yes Static Sitting Balance Static Sitting - Balance Support: Bilateral upper extremity supported;Feet supported Static Sitting - Level of Assistance: 6: Modified independent (Device/Increase time) Static Sitting - Comment/# of Minutes: 2  End of Session PT - End of Session Equipment Utilized During Treatment: Gait belt Activity Tolerance: Patient tolerated treatment well Patient left: in chair;with call bell/phone within reach Nurse Communication: Mobility status  GP     [name] 07/29/2012, 10:26 AM [phone]

## 2012-07-29 NOTE — Op Note (Signed)
[facility] [address] [place], [address]   ENDOSCOPY PROCEDURE REPORT  PATIENT: Carol, Koch  MR#: [id number] BIRTHDATE: 10-25-1924 , 88  yrs. old GENDER: Female ENDOSCOPIST: Iva Boop, MD, [facility] PROCEDURE DATE:  07/29/2012 PROCEDURE:  EGD, diagnostic ASA CLASS:     Class III INDICATIONS:  Hematemesis. MEDICATIONS: Versed 2 mg IV and Fentanyl 25 mcg IV TOPICAL ANESTHETIC: Cetacaine Spray  DESCRIPTION OF PROCEDURE: After the risks benefits and alternatives of the procedure were thoroughly explained, informed consent was obtained.  The Pentax Gastroscope [id number] endoscope was introduced through the mouth and advanced to the second portion of the duodenum. Without limitations.  The instrument was slowly withdrawn as the mucosa was fully examined.        ESOPHAGUS: A non-bleeding Mallory-Weiss tear with evidence of recent bleeding was found in the cardia.  The remainder of the upper endoscopy exam was otherwise normal. Retroflexed views revealed a Mallory-Weiss tear.     The scope was then withdrawn from the patient and the procedure completed.  COMPLICATIONS: There were no complications. ENDOSCOPIC IMPRESSION: 1.   Mallory-Weiss tear in the cardia 2.   The remainder of the upper endoscopy exam was otherwise normal  RECOMMENDATIONS: hold clopidogrel until 5/18 low-dose ASA ok (81 mg) if necessary daily PPI (would do continuous if to remain on ASA and clopidogrel - as prophylaxis) Advance diet F/u PCP to reassess anemia, after dc, should not need GI follow-up for this, can see prn daily iron at discharge (next 1-2 days?) eSigned:  Iva Boop, MD, [facility] 07/29/2012 12:03 PM revise

## 2012-07-30 ENCOUNTER — Encounter (HOSPITAL_COMMUNITY): Payer: Self-pay | Admitting: Internal Medicine

## 2012-07-30 DIAGNOSIS — K226 Gastro-esophageal laceration-hemorrhage syndrome: Secondary | ICD-10-CM | POA: Diagnosis not present

## 2012-07-30 DIAGNOSIS — K922 Gastrointestinal hemorrhage, unspecified: Secondary | ICD-10-CM | POA: Diagnosis not present

## 2012-07-30 DIAGNOSIS — G319 Degenerative disease of nervous system, unspecified: Secondary | ICD-10-CM | POA: Diagnosis not present

## 2012-07-30 DIAGNOSIS — F411 Generalized anxiety disorder: Secondary | ICD-10-CM | POA: Diagnosis not present

## 2012-07-30 DIAGNOSIS — D62 Acute posthemorrhagic anemia: Secondary | ICD-10-CM | POA: Diagnosis not present

## 2012-07-30 LAB — CBC
HCT: 23.5 % — ABNORMAL LOW (ref 36.0–46.0)
Hemoglobin: 8.1 g/dL — ABNORMAL LOW (ref 12.0–15.0)
MCH: 30.8 pg (ref 26.0–34.0)
MCHC: 34.5 g/dL (ref 30.0–36.0)
MCV: 89.4 fL (ref 78.0–100.0)
RBC: 2.63 MIL/uL — ABNORMAL LOW (ref 3.87–5.11)

## 2012-07-30 LAB — BASIC METABOLIC PANEL
BUN: 16 mg/dL (ref 6–23)
CO2: 26 mEq/L (ref 19–32)
Calcium: 8.3 mg/dL — ABNORMAL LOW (ref 8.4–10.5)
Creatinine, Ser: 0.62 mg/dL (ref 0.50–1.10)
GFR calc non Af Amer: 79 mL/min — ABNORMAL LOW (ref >90)
Glucose, Bld: 109 mg/dL — ABNORMAL HIGH (ref 70–99)
Sodium: 140 mEq/L (ref 135–145)

## 2012-07-30 NOTE — Progress Notes (Signed)
Physical Therapy Treatment Patient Details Name: [name] MRN: [id number] DOB: 07-25-1924 Today's Date: 07/30/2012 Time: 4098-1191 PT Time Calculation (min): 25 min  PT Assessment / Plan / Recommendation Comments on Treatment Session  Assisted pt out of bathroom then amb in hallway and around her room with an AD and without holding to IV pole.  Pt states she feels better has has no dizzyness. Pt admits she is eager to get back to the gym.    Follow Up Recommendations  No PT follow up     Does the patient have the potential to tolerate intense rehabilitation     Barriers to Discharge        Equipment Recommendations  None recommended by PT    Recommendations for Other Services    Frequency Min 3X/week   Plan      Precautions / Restrictions Precautions Precautions: None   Pertinent Vitals/Pain No c/o pain    Mobility  Bed Mobility Bed Mobility: Sit to Supine Supine to Sit: 5: Supervision Details for Bed Mobility Assistance: Assisted pt back to bed Transfers Transfers: Sit to Stand;Stand to Sit Sit to Stand: 5: Supervision;From chair/3-in-1 Stand to Sit: To bed;5: Supervision Details for Transfer Assistance: caution to IV lines and increased time Ambulation/Gait Ambulation/Gait Assistance: 5: Supervision;4: Min guard Ambulation Distance (Feet): 450 Feet Assistive device: None Ambulation/Gait Assistance Details: Had pt amb in hallway w/o holding to IV pole.  Good alternating gait with only one mild lateral LOB in which pt self corrected.  She turned her head to talk to a nurse.  Gait Pattern: Within Functional Limits     PT Goals                                    progressing    Visit Information  Last PT Received On: 07/30/12 Assistance Needed: +1    Subjective Data      Cognition    good   Balance   fair+  End of Session PT - End of Session Equipment Utilized During Treatment: Gait belt Activity Tolerance: Patient tolerated treatment  well Patient left: in bed;with call bell/phone within reach   Felecia Shelling  PTA [facility]  Acute  Rehab Pager      [phone]

## 2012-07-30 NOTE — Progress Notes (Signed)
Patient ID: [name], female   DOB: [birth year], 79 y.o.   MRN: [id number]  [facility] PROGRESS NOTE  [name] WUJ:[id number] DOB: [birth year] DOA: 07/28/2012 PCP: Carol Mcalpine, MD  Brief narrative:  HPI:  Pt is 79 yo female with history of CVA and PVD (on Plavix), who presents to [facility] ED with main concern of sudden onset hematemesis and blood per rectum that she noticed one day prior to admission, associated with lightheadedness and generalized weakness. She explains that she has taken by mistake extra dose of Plavix. She reports no similar events in the past, no chest pain or shortness of breath, no specific abdominal or urinary concerns.   In ED, pt with persistent dizziness and unable to stand up as a result, Hg ~9 (at baseline Hg ~12-13). On rectal exam, FOBT +. TR[facility] asked to admit for further evaluation. GI consulted by ED physician.   Assessment and Plan:  Active Problems:  Acute blood loss anemia  - secondary to Mallory - Weiss tear as per endoscopy results  - pt has received one unit of PRBC 05/40/2014 as Hg dropped from 9.2 --> 6.5 - appreciate GI consultation  - recommendation is to hold Plavix until 08/12/2012 and to continue low-dose ASA (81 mg) if necessary  - also recommended daily PPI, iron and possible d/c in 1-2 days if stable  - plan on transfusing one unit PRBC today  HYPERTENSION, MILD  - BP still soft  - will continue to hold home BP medications Norvasc and Losartan  CVA  - on Plavix, will hold for now until etiology of bleeding established   Consultants:  GI Procedures/Studies:  EGD 07/29/2012 --> Mallory-Weiss tear in the cardia, the remainder of the upper endoscopy exam was otherwise normal. (Dr. Leone Payor) Antibiotics:  None  Code Status: Full  Family Communication: Pt at bedside  Disposition Plan: Home when medically stable, likely in AM   HPI/Subjective: No events overnight.   Objective: Filed Vitals:   07/29/12 1232  07/29/12 1247 07/29/12 2140 07/30/12 0551  BP: 105/51 125/48 128/52 133/57  Pulse: 77 77 85 91  Temp:  98.5 F (36.9 C) 98.2 F (36.8 C) 98.3 F (36.8 C)  TempSrc:  Oral Oral Oral  Resp: 15 16 18 16   Height:      Weight:      SpO2: 97% 100% 100% 100%    Intake/Output Summary (Last 24 hours) at 07/30/12 1036 Last data filed at 07/30/12 0841  Gross per 24 hour  Intake 2726.66 ml  Output   3251 ml  Net -524.34 ml    Exam:   General:  Pt is alert, follows commands appropriately, not in acute distress  Cardiovascular: Regular rate and rhythm, S1/S2, no murmurs, no rubs, no gallops  Respiratory: Clear to auscultation bilaterally, no wheezing, no crackles, no rhonchi  Abdomen: Soft, non tender, non distended, bowel sounds present, no guarding  Extremities: No edema, pulses DP and PT palpable bilaterally  Neuro: Grossly nonfocal  Data Reviewed: Basic Metabolic Panel:  Recent Labs Lab 07/28/12 1328 07/28/12 1336 07/29/12 0515 07/30/12 0432  NA 136 138 141 140  K 3.7 3.8 3.7 3.5  CL 100 103 110 110  CO2 25  --  27 26  GLUCOSE 181* 181* 110* 109*  BUN 79* 86* 52* 16  CREATININE 0.91 1.00 0.85 0.62  CALCIUM 9.2  --  8.2* 8.3*   Liver Function Tests:  Recent Labs Lab 07/28/12 1328  AST 17  ALT 16  ALKPHOS 76  BILITOT 0.4  PROT 5.8*  ALBUMIN 3.0*   CBC:  Recent Labs Lab 07/28/12 1328 07/28/12 1336 07/28/12 1855 07/29/12 0515 07/30/12 0432  WBC 10.3  --   --  7.9 5.8  NEUTROABS 9.5*  --   --   --   --   HGB 9.4* 9.2* 6.5* 8.1* 8.1*  HCT 27.8* 27.0*  --  24.3* 23.5*  MCV 93.9  --   --  88.7 89.4  PLT 284  --   --  139* 124*   Scheduled Meds: . ferrous sulfate  325 mg Oral BID WC  . pantoprazole  40 mg Oral BID  . sodium chloride  3 mL Intravenous Q12H   Continuous Infusions: . dextrose 5 % and 0.9% NaCl 100 mL/hr at 07/29/12 1640   Debbora Presto, MD  [facility] Pager [phone]  If 7PM-7AM, please contact  night-coverage [email or web address] Password [email or web address] 07/30/2012, 10:36 AM   LOS: 2 days

## 2012-07-30 NOTE — Progress Notes (Signed)
Grossly confused, thinks she is supposed to be at home. States her sons will be worried about her but won't let this RN call her sons to ensure they know where she is tonight. States she should be in [place] which is 2 hours away. Reorients fairly well for brief periods of time. States "Clydie Braun didn't tell me that this was all going to happen" and states she "will tell Dr Kriste Basque in the morning about all of this mess". Will continue to monitor. Ginny Forth

## 2012-07-30 NOTE — Progress Notes (Signed)
 Gastroenterology Progress Note  Subjective:  No BM since admission.  No abdominal pain.  Had a full liquid diet this AM.  Objective:  Vital signs in last 24 hours: Temp:  [97.5 F (36.4 C)-98.6 F (37 C)] 98.3 F (36.8 C) (05/05 0551) Pulse Rate:  [76-98] 91 (05/05 0551) Resp:  [15-28] 16 (05/05 0551) BP: (87-154)/(36-66) 133/57 mmHg (05/05 0551) SpO2:  [97 %-100 %] 100 % (05/05 0551) Last BM Date: 07/29/12 General:   Alert, thin, in NAD Heart:  Regular rate and rhythm; no murmurs Pulm:  CTAB.  No W/R/R. Abdomen:  Soft, nontender and nondistended. Normal bowel sounds, without guarding, and without rebound.   Extremities:  Without edema. Psych:  Alert and cooperative. Normal mood and affect.  Intake/Output from previous day: 05/04 0701 - 05/05 0700 In: 2726.7 [P.O.:240; I.V.:2486.7] Out: 2651 [Urine:2650; Stool:1]  Lab Results:  Recent Labs  07/28/12 1328 07/28/12 1336 07/28/12 1855 07/29/12 0515 07/30/12 0432  WBC 10.3  --   --  7.9 5.8  HGB 9.4* 9.2* 6.5* 8.1* 8.1*  HCT 27.8* 27.0*  --  24.3* 23.5*  PLT 284  --   --  139* 124*   BMET  Recent Labs  07/28/12 1328 07/28/12 1336 07/29/12 0515 07/30/12 0432  NA 136 138 141 140  K 3.7 3.8 3.7 3.5  CL 100 103 110 110  CO2 25  --  27 26  GLUCOSE 181* 181* 110* 109*  BUN 79* 86* 52* 16  CREATININE 0.91 1.00 0.85 0.62  CALCIUM 9.2  --  8.2* 8.3*   LFT  Recent Labs  07/28/12 1328  PROT 5.8*  ALBUMIN 3.0*  AST 17  ALT 16  ALKPHOS 76  BILITOT 0.4   PT/INR  Recent Labs  07/28/12 1328  LABPROT 13.6  INR 1.05   Assessment / Plan: -MWT, in the cardia seen on EGD 5/4. -UGIB and ABLA secondary to the above in a patient who was on plavix and ASA 81 mg.  S/P one unit of PRBC's on 5/4 and Hgb stable today.  *Hold plavix until 5/18 according to Dr. Marvell Fuller recommendations.  Ok to give ASA 81 mg if necessary. *Continue PPI (is currently on BID). *Continue to advance diet as tolerated. *Monitor  Hgb and transfuse further if needed. *No GI follow-up needed.  **Just of note, patient says that she was only taking her Plavix "every once in a while" because she "didn't see the need to take it every day". I have reviewed the above note, examined the patient and agree with plan of treatment.Pt has very limited understanding of her condition. I wonder if she is at all a candidate for long term Plavix since she takes it erratically and doubled up on it before she bled. I will leave up to Dr Izola Price whether to transfuse her before discharge, she is asking about it.  Willa Rough [facility] Pager # [phone]    LOS: 2 days   Doug Sou D.  07/30/2012, 8:25 AM  Pager number [phone]

## 2012-07-31 DIAGNOSIS — J3089 Other allergic rhinitis: Secondary | ICD-10-CM | POA: Diagnosis not present

## 2012-07-31 DIAGNOSIS — K922 Gastrointestinal hemorrhage, unspecified: Secondary | ICD-10-CM | POA: Diagnosis not present

## 2012-07-31 DIAGNOSIS — F411 Generalized anxiety disorder: Secondary | ICD-10-CM | POA: Diagnosis not present

## 2012-07-31 DIAGNOSIS — D62 Acute posthemorrhagic anemia: Secondary | ICD-10-CM | POA: Diagnosis not present

## 2012-07-31 LAB — CBC
HCT: 26 % — ABNORMAL LOW (ref 36.0–46.0)
Hemoglobin: 8.8 g/dL — ABNORMAL LOW (ref 12.0–15.0)
MCH: 30.4 pg (ref 26.0–34.0)
MCHC: 33.8 g/dL (ref 30.0–36.0)
MCV: 90 fL (ref 78.0–100.0)
Platelets: 126 10*3/uL — ABNORMAL LOW (ref 150–400)
RBC: 2.89 MIL/uL — ABNORMAL LOW (ref 3.87–5.11)
RDW: 14 % (ref 11.5–15.5)
WBC: 5.3 10*3/uL (ref 4.0–10.5)

## 2012-07-31 LAB — BASIC METABOLIC PANEL
BUN: 8 mg/dL (ref 6–23)
Calcium: 8.3 mg/dL — ABNORMAL LOW (ref 8.4–10.5)
Chloride: 104 mEq/L (ref 96–112)
Creatinine, Ser: 0.62 mg/dL (ref 0.50–1.10)
GFR calc Af Amer: >90 mL/min (ref >90)

## 2012-07-31 LAB — TYPE AND SCREEN
ABO/RH(D): O POS
Unit division: 0

## 2012-07-31 MED ORDER — FERROUS SULFATE 325 (65 FE) MG PO TABS: 325.0000 mg | ORAL_TABLET | Freq: Two times a day (BID) | ORAL | Status: DC

## 2012-07-31 MED ORDER — POTASSIUM CHLORIDE CRYS ER 20 MEQ PO TBCR
40.0000 meq | EXTENDED_RELEASE_TABLET | Freq: Once | ORAL | Status: AC
  Administered 2012-07-31: 40 meq via ORAL
  Filled 2012-07-31: qty 2

## 2012-07-31 MED ORDER — PANTOPRAZOLE SODIUM 40 MG PO TBEC: 40.0000 mg | DELAYED_RELEASE_TABLET | Freq: Two times a day (BID) | ORAL | Status: DC

## 2012-07-31 NOTE — Progress Notes (Signed)
Patient took off her telemetry and tore off her armbands and tried to leave via the back stairway. Redirected with the help of security, several nurses, and nurse technicians.  Educated that her car is not here and for safety that the patient needs to wait for her son to get here to leave. Patient is still not oriented to where she is and still believes that her car is in the parking lot. Patient's son notified and he will cancel some afternoon appointments in order to try to be here at 1630. [name]

## 2012-07-31 NOTE — Progress Notes (Signed)
Patient was picked up by her son. Discharge instructions given to both patient and son. Son expressed that the confusion was not a new issue but that he would be following up on her home situation.  Erskin Burnet RN

## 2012-07-31 NOTE — Progress Notes (Signed)
Post  UGI bleed from a MWT in the setting of erroneous Plavix dosage due to patients confusion. She is stable from GI standpoint but  She  should not go back on Plavix , ever, due to her confusion. Will sign off.

## 2012-07-31 NOTE — Discharge Summary (Signed)
Physician Discharge Summary  Katlyne Nishida OZH:[id number] DOB: [birth year] DOA: 07/28/2012  PCP: Michele Mcalpine, MD  Admit date: 07/28/2012 Discharge date: 07/31/2012  Recommendations for Outpatient Follow-up:  1. Pt will need to follow up with PCP in 2-3 weeks post discharge 2. Please obtain BMP to evaluate electrolytes and kidney function, potassium level 3. Pt given one dose of K-dur 40 MEQ prior to discharge 4. Please also check CBC to evaluate Hg and Hct levels 5. Please also note that pt was advised to stop taking Plavix and possibly permanently due to intermittent episodes of confusion   Discharge Diagnoses: Acute blood loss anemia secondary to Mallory weiss tear Active Problems:   Acute blood loss anemia   HYPERTENSION, MILD   CVA   Mallory-Weiss tear with bleeding  Discharge Condition: Stable  Diet recommendation: Heart healthy diet discussed in details   Brief narrative:  HPI:  Pt is 79 yo female with history of CVA and PVD (on Plavix), who presents to [facility] ED with main concern of sudden onset hematemesis and blood per rectum that she noticed one day prior to admission, associated with lightheadedness and generalized weakness. She explains that she has taken by mistake extra dose of Plavix. She reports no similar events in the past, no chest pain or shortness of breath, no specific abdominal or urinary concerns.   In ED, pt with persistent dizziness and unable to stand up as a result, Hg ~9 (at baseline Hg ~12-13). On rectal exam, FOBT +. TRH asked to admit for further evaluation. GI consulted by ED physician.   Assessment and Plan:  Active Problems:  Acute blood loss anemia  - secondary to Mallory - Weiss tear as per endoscopy results  - pt has received one unit of PRBC 05/40/2014 as Hg dropped from 9.2 --> 6.5  - appreciate GI consultation  - recommendation is to hold Plavix until 08/12/2012 and possibly even permanently due to confusion and to continue low-dose ASA (81  mg) if necessary  - also recommended daily PPI, iron  - pt has also received one additional unit of blood 5/5 HYPERTENSION, MILD  - BP reasonably well controlled in the hospital - continue home medical regimen  CVA  - on Plavix but recommended stopping until 5/18 or even permanently, to be determined by PCP  Consultants:  GI Procedures/Studies:  EGD 07/29/2012 --> Mallory-Weiss tear in the cardia, the remainder of the upper endoscopy exam was otherwise normal. (Dr. Leone Payor) Antibiotics:  None  Code Status: Full  Family Communication: Pt at bedside   Discharge Exam: Filed Vitals:   07/31/12 0550  BP: 132/52  Pulse: 78  Temp: 98.4 F (36.9 C)  Resp: 16   Filed Vitals:   07/30/12 1445 07/30/12 1524 07/30/12 2201 07/31/12 0550  BP: 140/58 142/64 141/55 132/52  Pulse: 72 79 72 78  Temp: 98.6 F (37 C) 98.4 F (36.9 C) 98.1 F (36.7 C) 98.4 F (36.9 C)  TempSrc: Oral Oral Oral Oral  Resp: 16 16 16 16   Height:      Weight:      SpO2:   100% 99%    General: Pt is alert, follows commands appropriately, not in acute distress Cardiovascular: Regular rate and rhythm, S1/S2 +, no murmurs, no rubs, no gallops Respiratory: Clear to auscultation bilaterally, no wheezing, no crackles, no rhonchi Abdominal: Soft, non tender, non distended, bowel sounds +, no guarding Extremities: no edema, no cyanosis, pulses palpable bilaterally DP and PT Neuro: Grossly nonfocal  Discharge Instructions  Discharge Orders   Future Appointments Provider Department Dept Phone   09/11/2012 10:30 AM Michele Mcalpine, MD [facility] [phone]   Future Orders Complete By Expires     Diet - low sodium heart healthy  As directed     Increase activity slowly  As directed         Medication List    STOP taking these medications       clopidogrel 75 MG tablet  Commonly known as:  PLAVIX      TAKE these medications       ALPRAZolam 0.25 MG tablet  Commonly known as:  XANAX  Take 1/2  to 1 tablet by mouth three times daily as needed for nerves     amLODipine 10 MG tablet  Commonly known as:  NORVASC  Take 1 tablet (10 mg total) by mouth daily.     aspirin 81 MG tablet  Take 81 mg by mouth daily.     calcium gluconate 500 MG tablet  Take 500 mg by mouth daily.     ESTER C PO  Take 1 tablet by mouth daily.     ferrous sulfate 325 (65 FE) MG tablet  Take 1 tablet (325 mg total) by mouth 2 (two) times daily with a meal.     fish oil-omega-3 fatty acids 1000 MG capsule  Take 2 g by mouth daily.     Glucosamine HCl 1000 MG Tabs  Take 1 tablet by mouth daily.     losartan 50 MG tablet  Commonly known as:  COZAAR  Take 1 tablet (50 mg total) by mouth daily.     METAMUCIL 30.9 % Powd  Generic drug:  Psyllium  1 tsp daily     MULTIVITAMIN PO  Take 1 tablet by mouth daily.     pantoprazole 40 MG tablet  Commonly known as:  PROTONIX  Take 1 tablet (40 mg total) by mouth 2 (two) times daily.     Vitamin D 1000 UNITS capsule  Take 1,000 Units by mouth daily.           Follow-up Information   Follow up with NADEL,SCOTT M, MD In 4 weeks.   Contact information:   [address] [place] [address] [phone]        The results of significant diagnostics from this hospitalization (including imaging, microbiology, ancillary and laboratory) are listed below for reference.     Microbiology: No results found for this or any previous visit (from the past 240 hour(s)).   Labs: Basic Metabolic Panel:  Recent Labs Lab 07/28/12 1328 07/28/12 1336 07/29/12 0515 07/30/12 0432 07/31/12 0425  NA 136 138 141 140 136  K 3.7 3.8 3.7 3.5 3.1*  CL 100 103 110 110 104  CO2 25  --  27 26 26   GLUCOSE 181* 181* 110* 109* 117*  BUN 79* 86* 52* 16 8  CREATININE 0.91 1.00 0.85 0.62 0.62  CALCIUM 9.2  --  8.2* 8.3* 8.3*   Liver Function Tests:  Recent Labs Lab 07/28/12 1328  AST 17  ALT 16  ALKPHOS 76  BILITOT 0.4  PROT 5.8*  ALBUMIN 3.0*    CBC:  Recent Labs Lab 07/28/12 1328 07/28/12 1336 07/28/12 1855 07/29/12 0515 07/30/12 0432 07/31/12 0425  WBC 10.3  --   --  7.9 5.8 5.3  NEUTROABS 9.5*  --   --   --   --   --   HGB 9.4* 9.2* 6.5* 8.1*  8.1* 8.8*  HCT 27.8* 27.0*  --  24.3* 23.5* 26.0*  MCV 93.9  --   --  88.7 89.4 90.0  PLT 284  --   --  139* 124* 126*    SIGNED: Time coordinating discharge: Over 30 minutes  Debbora Presto, MD  [facility] 07/31/2012, 11:19 AM Pager [phone]  If 7PM-7AM, please contact night-coverage [email or web address] Password [email or web address]

## 2012-07-31 NOTE — Progress Notes (Addendum)
Patient is not fully oriented. Patient keeps telling me that she is at the [facility] and does not know that she is in the hospital. She is able to say her name, the year, and why she came in. She keeps stating that her car is in the parking lot and she wants to drive home. Spoke with patient's son. Confusion is not a new issue. Patient was brought in by ambulance and her car is not here per son. He will be here at 5:15 to 5:30 to come pick her up. Patient is upset about not being able to leave and keeps stating that she knows enough to get around. It seems she is aware of the confusion on a certain level and is trying to compensate in order to stay home on her own. Notified Dr. Izola Price of situation. She will contact son and discuss with him as well.  Will continue to monitor. Erskin Burnet RN

## 2012-08-09 ENCOUNTER — Ambulatory Visit (INDEPENDENT_AMBULATORY_CARE_PROVIDER_SITE_OTHER): Payer: Medicare Other | Admitting: Pulmonary Disease

## 2012-08-09 ENCOUNTER — Other Ambulatory Visit (INDEPENDENT_AMBULATORY_CARE_PROVIDER_SITE_OTHER): Payer: Medicare Other

## 2012-08-09 ENCOUNTER — Encounter: Payer: Self-pay | Admitting: Pulmonary Disease

## 2012-08-09 ENCOUNTER — Telehealth: Payer: Self-pay | Admitting: Neurology

## 2012-08-09 VITALS — BP 110/60 | HR 97 | Temp 98.5°F | Ht 61.0 in | Wt 101.4 lb

## 2012-08-09 DIAGNOSIS — I10 Essential (primary) hypertension: Secondary | ICD-10-CM

## 2012-08-09 DIAGNOSIS — K226 Gastro-esophageal laceration-hemorrhage syndrome: Secondary | ICD-10-CM

## 2012-08-09 DIAGNOSIS — D62 Acute posthemorrhagic anemia: Secondary | ICD-10-CM

## 2012-08-09 DIAGNOSIS — I6350 Cerebral infarction due to unspecified occlusion or stenosis of unspecified cerebral artery: Secondary | ICD-10-CM | POA: Diagnosis not present

## 2012-08-09 DIAGNOSIS — M810 Age-related osteoporosis without current pathological fracture: Secondary | ICD-10-CM

## 2012-08-09 DIAGNOSIS — R413 Other amnesia: Secondary | ICD-10-CM

## 2012-08-09 DIAGNOSIS — M542 Cervicalgia: Secondary | ICD-10-CM

## 2012-08-09 DIAGNOSIS — M1990 Unspecified osteoarthritis, unspecified site: Secondary | ICD-10-CM

## 2012-08-09 DIAGNOSIS — G319 Degenerative disease of nervous system, unspecified: Secondary | ICD-10-CM

## 2012-08-09 DIAGNOSIS — E785 Hyperlipidemia, unspecified: Secondary | ICD-10-CM

## 2012-08-09 LAB — CBC WITH DIFFERENTIAL/PLATELET
Basophils Absolute: 0 10*3/uL (ref 0.0–0.1)
Eosinophils Relative: 0.7 % (ref 0.0–5.0)
MCV: 94.6 fl (ref 78.0–100.0)
Monocytes Absolute: 0.5 10*3/uL (ref 0.1–1.0)
Neutrophils Relative %: 84 % — ABNORMAL HIGH (ref 43.0–77.0)
Platelets: 364 10*3/uL (ref 150.0–400.0)
WBC: 8.2 10*3/uL (ref 4.5–10.5)

## 2012-08-09 LAB — BASIC METABOLIC PANEL
BUN: 12 mg/dL (ref 6–23)
Chloride: 99 mEq/L (ref 96–112)
Creatinine, Ser: 1 mg/dL (ref 0.4–1.2)
GFR: 55.83 mL/min — ABNORMAL LOW (ref >60.00)

## 2012-08-09 LAB — IBC PANEL
Iron: 77 ug/dL (ref 42–145)
Transferrin: 219.3 mg/dL (ref 212.0–360.0)

## 2012-08-09 MED ORDER — PHOSPHATIDYLSERINE-DHA-EPA 100-19.5-6.5 MG PO CAPS: 1.0000 | ORAL_CAPSULE | Freq: Every day | ORAL | Status: DC

## 2012-08-09 NOTE — Patient Instructions (Addendum)
Today we updated your med list in our EPIC system...    Continue your current medications the same => see list below...  Today we did your follow up blood work...     We will contact you w/ the results when available...   We decided to add VAYACOG one cap daily to see if you feel this is helping your memory...  Call for any questions...  Let's plan a follow up visit in 1 month for recheck.Marland KitchenMarland Kitchen

## 2012-08-09 NOTE — Progress Notes (Signed)
Subjective:    Patient ID: [name], female    DOB: September 28, 1924, 79 y.o.   MRN: [id number]  HPI 79 y/o WF here for a follow up visit... she has multiple medical problems as noted below...  Followed for general medical purposes w/ hx of HBP, mild MVP, ASPVD, Goiter, Divertics, DJD w/ left THR, Osteoporosis, hx right occipital stoke & 3rd N paresis, Memory loss, & mod severe anxiety...  ~  February 28, 2011:  80mo ROV & she is doing well overall just c/o some gas & we discussed Simethacone preps for this- Mylicon, GasX, Phazyme, etc...  BP well controlled on Amlodip & she denies CP, palpit, SOB, edema; she continues to exercise regularly at the gym; she remains on Plavix & denies cerebral ischemic symptoms; we reviewed labs from 7/12 and FLP ok on her diet + FishOil and Thyroid remains wnl; hx DJD w/ prev left THR & she uses OTC analgesics as needed; she is still not using the Alpraz anxiolytic...  She had the Shingles vaccine & the Flu shot this yr; Plavix & Amlodip refilled per request...  ~  Jul 29, 2011:  95mo ROV & [name] is added-on for mult symptoms> states BP has been up (readings 150-160 at home) & she is very concerned "I can feel it when it's up- I'm more anxious, not as calm" she will take 1/4th of an Alpraz0.5mg  tab & this helps she says;  Also c/o eyes feel strained & left arm/ shoulder discomfort- but it's not too bad & she refuses Ortho eval... We decided to add LOSARTAN 50mg /d to her regimen & recheck her BP in about 6 weeks... We reviewed her prob list, meds, Xrays & labs>> BMD 5/13:  Severe osteoporosis w/ TScores -2.9 in Spine & -3.3 in Femur; also measured -5.2 in forearm/Radius... rec to start Reclast yearly...  ~  September 19, 2011:  6wk ROV & [name] notes some trouble w/ her memory (see below) and is here to review the need for Reclast to treat her osteoporosis (see below);  Oddly enough- while she refuses interventions for these important medical issues, her CC today is a minor  discoloration of her nails that concerns her & she is referred to [facility] for this analysis...    HBP> on Norvasc10, Cozaar50;  BP= 142/72 & she denies CP, palpit, SOB, edema...    MVP> she denies CP, palpit, and exercises daily at he gym...    ASPVD> on Plavix75; she denies cerebral ischemic symptoms...    Chol> on FishOil; FLP looks ok showing TChol 190, TG 88, HDL 61, LDL 111    Goiter> palp thyroid, no change, and euthyroid w/ TSH=1.29...    GI> Divertics, Hems> on Metamucil;     DJD> on Glucosamine etc; she denies neck discomfort etc & still works out daily in [employer]...    Osteoporosis> on Calcium, VitD, MVI; BMD 5/13 showed severe osteoporosis w/ TScores -2.9 in Spine & -3.3 in Femur; also measured -5.2 in forearm/Radius> she was asked to start Reclast & show TP 5/13 but couldn't make a decision; now she tells me she has decided NOT to take Reclast or any other osteoporosis therapy; I carefully reviewed the serious consequences of her decision & offered her second opinions w/ GYN, Endocrine, Rheum, whatever but she declines; I further offered to explain to her son but she also declined this intervention...    Hx stroke & cerebral atrophy> on Plavix75; she is noting problems w/ her memory as she  is having to write herself notes; offered Aricept vs Neuro but she declines new meds or referral...    Anxiety> on Xanax0.25 prn; she'l take 1/2 tab prn & notes that it helps... We reviewed prob list, meds, xrays and labs> see below>> LABS 6/13:  FLP- at goals x LDL=111;  Chems- ok;  CBC- ok;  TSH=1.29;  VitD=50  ~  March 13, 2012:  72mo ROV & Addeline is stable but has mult somatic complaints that she blames on her Plavix rx;  We reviewed the following medical problems during today's office visit >>     HBP> on Norvasc10, Cozaar50;  BP= 142/72 & she denies CP, palpit, SOB, edema...    MVP> she denies CP, palpit, and exercises daily at he gym...    ASPVD> on ASA81, Plavix75; she denies cerebral ischemic  symptoms...    Chol> on FishOil; she has refused any & all statin meds; FLP 6/13 looked ok showing TChol 190, TG 88, HDL 61, LDL 111    Goiter> palp thyroid, no change, and euthyroid w/ TSH=1.29...    GI> Divertics, Hems> on Metamucil;     DJD> on Glucosamine, bioflavinoids, etc; she denies neck discomfort etc & still works out daily in the gym...    Osteoporosis> on Calcium, VitD, MVI; BMD 5/13 showed severe osteoporosis w/ TScores -2.9 in Spine & -3.3 in Femur; also measured -5.2 in forearm/Radius> she was asked to start Reclast & saw TP 5/13 but couldn't make a decision; now she tells me she has decided NOT to take Reclast or any other osteoporosis therapy; I carefully reviewed the serious consequences of her decision & offered her second opinions w/ GYN, Endocrine, Rheum, whatever but she declines; I further offered to explain to her son but she also declined this intervention...    Hx stroke & cerebral atrophy> on ASA81, Plavix75; she had right occipital infarct 9/10; she is followed by Neuro, Pearlean Brownie- note 8/13 from NP is reviewed> MMSE 24/30, mult somatic complaints, anxious & wanted MRI since several relatives w/ brain tumors- MRI 11/13 showed chr microvasc ischemic dis & generalized cerebral atrophy, no acute ischemia, no lesions seen...    Anxiety> on Xanax0.25 prn; she'll take 1/2 tab prn & notes that it helps... We reviewed prob list, meds, xrays and labs> see below for updates >> she had the 2013 Flu vaccine 10/13...  ~  Aug 09, 2012:  69mo ROV & post hosp check> [name] was Adm 5/3 - 07/31/12 by [facility] w/ hematemesis & rectal bleeding assoc w/ weakness & lightheadedness with an initial  Hg=9.2 that dropped to 6.5; Aspirin & Plavix were held (she wanted to blame the Plavix for the bleeding); EGD by DrGessner showed a Mallory-Weiss tear in upper stomach & she was placed on PPI, transfused 1u, started on Iron & Hg improved to 8.8 by discharge..  Since her disch she is improved but still confused &  here w/ her son from [place]...  We reviewed the following medical problems during today's office visit >>     HBP> on Norvasc10 & off prev Cozaar50 (she stopped this on her own- confused about meds);  BP= 110/60 & she denies CP, palpit, SOB, edema; we decided to leave the ARB off as it looks like BP adeq controlled on Amlod alone...    MVP> she denies CP, palpit, and exercises daily at he gym...    ASPVD> Plavix was stopped 5/14 hosp & she will restart ASA81; she denies cerebral ischemic symptoms.Marland KitchenMarland Kitchen  Chol> on FishOil; she has refused any & all statin meds; FLP 6/13 looked ok showing TChol 190, TG 88, HDL 61, LDL 111    Goiter> palp thyroid, no change, and euthyroid w/ TSH=1.29 ([id number])...    GI- Mallory-Weiss tear w/ GIB 5/13> on Protonix40Bid & off Plavix now; denies abd pain, dysphagia, n/v, etc...    Divertics, Hems> on Metamucil;     DJD> on Glucosamine, bioflavinoids, etc; she denies neck discomfort etc & still works out daily in the gym...    Osteoporosis> on Calcium, VitD, MVI; BMD 5/13 showed severe osteoporosis w/ TScores -2.9 in Spine & -3.3 in Femur; also measured -5.2 in forearm/Radius> she was asked to start Reclast & saw TP 5/13 but couldn't make a decision; she tells me she has decided NOT to take Reclast or any other osteoporosis therapy; I carefully reviewed the serious consequences of her decision & offered her second opinions w/ GYN, Endocrine, Rheum, whatever but she declines; I further offered to explain to her son but she also declined this intervention...    Hx stroke & cerebral atrophy> on ASA81 alone now as the Plavix was stopped 5/14 due to GIB; she had right occipital infarct 9/10; she is followed by Neuro, Pearlean Brownie- note 10/13 & 12/13 are reviewed> MMSE 24/30, mult somatic complaints, anxious & wanted MRI since several relatives w/ brain tumors- MRI 11/13 showed chr microvasc ischemic dis & generalized cerebral atrophy, no acute ischemia, no lesions seen; EEG 11/13 was neg-  no seizure activity; she claimed Plavix caused memory loss but DrSethi set her straight and recommended ASA81, strict BP control, fish oil, work puzzles, etc...    Anxiety> on Xanax0.25 prn; she'll take 1/2 tab prn & notes that it helps... We reviewed prob list, meds, xrays and labs> see below for updates >>  LABS 5/14:  Chems- wnl;  CBC- improved w/ Hg=12.4, Fe=77 (25%sat)...           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 eval by Neurology/ stroke team showed tiny right occipital infarct, diffuse intra /extracranial atherosclerotic dis (but no focal stenoses or interventionable lesions) & a part left 3rd nerve palsy as well... PLAVIX was added to her ASA therapy... ~  11/10:  f/u DrSethi stopped ASA, continued Plavix for her cerebrovasc dis... ~  11/10:  [facility]artin [facility]- he predicted good prognosis for spont recovery of left eye movement. ~  1/11:  her left 3rd nerve palsy has resolved...  ALLERGIC RHINITIS (ICD-477.8) - uses OTC antihistamines infrequently...  HYPERTENSION, MILD (ICD-401.1) - she has hx of HBP and on NORVASC 10mg /d...  ~  CXR 1/09 showed normal heart size, clear lungs, NAD... ~  12/12:  BP= 128/72 today and 130's/ 70's at home... denies HA, fatigue, CP, palipit, dizziness, syncope, dyspnea; min edema noted. ~  5-6/13:  BP= 134/70 but she is quite concerned about BPs up to 150-160 at home; we decided to add LOSARTAN 50mg /d to her meds & f/u in 6 weeks... ~  12/13: on Norvasc10, Cozaar50;  BP= 142/72 & she denies CP, palpit, SOB, edema. ~  5/14: post hosp visit on Norvasc10 & off prev Cozaar50 (she stopped this on her own- confused about meds);  BP= 110/60 & she denies CP, palpit, SOB, edema; we decided to leave the ARB off as it looks like BP adeq controlled  on Amlod alone.  Hx of MITRAL VALVE PROLAPSE (ICD-424.0) - baseline EKG w/  NSR, WNL;  Neg stress thallium 4/93... she denies CP, palpit, SOB, etc... she exercises regularly walking 3-4 miles and going to the gym. ~  2DEcho 9/10 showed trivial prolapse of the post leaflet, norm LVF w/ EF= 65-70%, no regional wall motion abnormalities...  PERIPHERAL VASCULAR DISEASE (ICD-443.9) - on ASA81, PLAVIX 75mg /d... CTAbd 8/06 showed thickening of Ao wall & prob plaque;  CDopplers 7/08 showed 0-39% bilat ICA stenoses & plaque in Rt subclavian art (plus Rt Thyroid Mass). ~  MRI/ MRA Brain & Neck 9/10 showed intra & extracranial atherosclerotic changes w/o interventionable lesions & no hemodynamically signif stenoses in the neck...  VENOUS INSUFFICIENCY (ICD-459.81) - she had a vein stripping yrs ago> she follows a low sodium diet & has no edema at this time...  HYPERCHOLESTEROLEMIA, BORDERLINE (ICD-272.4) - on diet + FISH OIL, she does not want meds. ~  FLP 1/11 showed TChol 199, TG 109, HDL 66, LDL 112 ~  FLP 7/11 showed TChol 212, TG 66, HDL 69, LDL 133 ~  FLP 1/12 showed TChol 208, TG 106, HDL 66, LDL 124 ~  FLP 7/12 showed TChol 198, TG 47, HDL 75, LDL 114 ~  FLP 6/13 showed TChol 190, TG 88, HDL 61, LDL 111  GOITER, UNSPECIFIED (ICD-240.9) - Old CXR's show deviation of trachea to left by thyroid & CDoppler showed right thyroid mass... she has no local symptoms, swallowing difficulty & is clinically euthyroid...  ~  labs 6/09 showed TSH= 1.04 ~  labs 6/10 showed TSH= 0.98 ~  labs 7/11 showed TSH= 1.40 ~  labs 7/12 showed TSH= 1.30 ~  Labs 6/13 showed TSH= 1.29  GIB secondary to MALLORY-WEISS TEAR >> this occurred 5/14 after N/V of ?reason => hemetemesis & blood in stool => assoc w/ weakness & lightheadedness with an initial Hg=9.2 that dropped to 6.5; Aspirin & Plavix were held (she wanted to blame the Plavix for the bleeding); EGD by DrGessner showed a Mallory-Weiss tear in upper stomach & she was placed on PPI, transfused 1u, started on Iron & Hg improved to 8.8 by  discharge.  DIVERTICULOSIS OF COLON (ICD-562.10) & HEMORRHOIDS (ICD-455.6) - Hx of very tortuous & redundant colon w/ severe sigmoid diverticulosis;  last colonoscopy w/ pediatric scope 10/98 was difficult & subseq barium enema showed severe divertics otherwise neg;  Rx fiber, anusol, etc...  CTAbd 1/03 w/ divertics otherwise neg;  she is reminded to take the MIRALAX/ Metamucil regularly.  UNSPECIFIED CYSTITIS (ICD-595.9) - Urology eval 8/09 by DrDalstadt...  OSTEOARTHRITIS (ICD-715.90) - DJD in both hips L>Rt w/ LTHR done 2/09 by DrAlusio... she takes Glucosamine, Calcium, MVI, VitC & VitD. ~  8/11: c/o neck pain & CSpine films by DrSethi showed DDD, 3mm anterolisthesis C4 on C5, osteopenia... ~  2/12:  c/o continued neck discomfort ?eval by neurosurg she said?, advised rest, heat, Tramadol Prn.   OSTEOPOROSIS (ICD-733.00) - BMD by DrNeal 7/02 showed TScores -2.3 to -3.2;  regular f/u BMD from DrNeal - last 8/08 w/ TScores -2.6 to -3.2;  Vit D level checked by GYN in past & was 53 here 6/09... ~  12/09:  she reports that she stopped her Fosamax after 12 yrs Rx per DrNeal & he has rec Reclast but she is undecided & inclined to wait til next BMD and see if there has been any deterioration in measured bone density... ~  BMD 5/13:  Severe osteoporosis w/ TScores -2.9  in Spine & -3.3 in Femur; also measured -5.2 in forearm/Radius... rec to start Reclast yearly... ~  5/13:  She has declined Reclast & all other osteoporosis interventions offered to her; she has similarly declined referral for 2nd opinions etc... ~  12/13: we reviewed prev data & again offered Reclast rx, & referrals for 2nd opinions; she declines all interventions...  CVA (ICD-434.91) - Hosp 9/10 w/ blurry vision & exam showing mild field cut & left eye strabismus... eval by Neurology/ stroke team showed tiny right occipital infarct, diffuse intra /extracranial atherosclerotic dis (but no focal stenoses or interventionable lesions) & a  part left 3rd nerve palsy as well... PLAVIX was added to her ASA therapy... SEE DC SUMMARY  CEREBRAL ATROPHY (ICD-331.9) - CTBrain 10/07 showed mild atrophy, otherw neg... she denies memory problems- with minor changes on MMSE testing... 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 ~  6/10: when asked about her memory she states "it depends on what day it is" ~  1/11:  I see signs of mild deterioration in memory but she denies- eg. she couldn't recall what DrTMartin told her at [facility] about her 3rd nerve paresis, and she insisted that her mother lived to 74, then later corrected herself (she lived to 23). ~  2/12: similar signs of memory impairment w/ her c/o blurry vision & neck pain> she couldn't recall prev w/u by neuro or results. ~  F/u Neuro(Sethi)- note 8/13, 10/13, & 12/13 from NP & DrSethi> MMSE 24/30, mult somatic complaints, anxious & wanted MRI since several relatives w/ brain tumors- MRI 11/13 showed chr microvasc ischemic dis & generalized cerebral atrophy, no acute ischemia, no lesions seen...  ANXIETY (ICD-300.00) - she has severe stress having found her son at home after a suicide... she has ALPRAZOLAM for Prn use but doesn't take it... she was rec to try Zoloft per Neuro but she never filled it.  ANEMIA related to GIB 5/14 from M-W tear >> ~  Previous CBCs showed Hg= 12.8 - 14.2 ~  Adm 5/14 w/ GIB and Hg nadir = 6.5.Marland KitchenMarland Kitchen Tx 1u & placed on Fe w/ improvement to 8.8 by disch... ~  Labs 5/14 in office follow up showed Hg= 12.4   Past Surgical History  Procedure Laterality Date  . Breast lumpectomy  1960's    benign breast biopsy  . Cholecystectomy  1980    at [facility]  Marland Kitchen Appendectomy  1980    at [facility].  . Abdominal hysterectomy  07/1980    hyst and rectocele repair by Dr. Dewaine Conger  . Oculoplastic eye surgery  12/1997    in [place]  . Vv stripping years ago    . Esophagogastroduodenoscopy N/A 07/29/2012    Procedure: ESOPHAGOGASTRODUODENOSCOPY (EGD);   Surgeon: Iva Boop, MD;  Location: [facility];  Service: Endoscopy;  Laterality: N/A;    Outpatient Encounter Prescriptions as of 08/09/2012  Medication Sig Dispense Refill  . ALPRAZolam (XANAX) 0.25 MG tablet Take 1/2 to 1 tablet by mouth three times daily as needed for nerves       . amLODipine (NORVASC) 10 MG tablet Take 1 tablet (10 mg total) by mouth daily.  30 tablet  11  . aspirin 81 MG tablet Take 81 mg by mouth daily.      Marland Kitchen Bioflavonoid Products (ESTER C PO) Take 1 tablet by mouth daily.        . calcium gluconate 500 MG tablet Take 500 mg by mouth daily.       Marland Kitchen  Cholecalciferol (VITAMIN D) 1000 UNITS capsule Take 1,000 Units by mouth daily.        . ferrous sulfate 325 (65 FE) MG tablet Take 1 tablet (325 mg total) by mouth 2 (two) times daily with a meal.  60 tablet  0  . fish oil-omega-3 fatty acids 1000 MG capsule Take 2 g by mouth daily.        . Glucosamine HCl 1000 MG TABS Take 1 tablet by mouth daily.        . Multiple Vitamin (MULTIVITAMIN PO) Take 1 tablet by mouth daily.        . pantoprazole (PROTONIX) 40 MG tablet Take 1 tablet (40 mg total) by mouth 2 (two) times daily.  60 tablet  1  . Psyllium (METAMUCIL) 30.9 % POWD 1 tsp daily       . losartan (COZAAR) 50 MG tablet Take 1 tablet (50 mg total) by mouth daily.  30 tablet  5   No facility-administered encounter medications on file as of 08/09/2012.    Allergies  Allergen Reactions  . Augmentin (Amoxicillin-Pot Clavulanate)   . Septra (Sulfamethoxazole-Tmp Ds)   . Shellfish Allergy   . Sulfamethoxazole W-Trimethoprim Other (See Comments)    allergy to Sulfa w/ flu-like illness.  . Amoxicillin-Pot Clavulanate Other (See Comments)     "fuzzy-headed"    Review of Systems        See HPI - all other systems neg except as noted... The patient denies anorexia, fever, weight loss, weight gain, vision loss, decreased hearing, hoarseness, chest pain, syncope, dyspnea on exertion, peripheral edema, prolonged  cough, headaches, hemoptysis, abdominal pain, melena, hematochezia, severe indigestion/heartburn, hematuria, incontinence, muscle weakness, suspicious skin lesions, transient blindness, difficulty walking, depression, unusual weight change, abnormal bleeding, enlarged lymph nodes, and angioedema.    Objective:   Physical Exam     WD,Thin, 79 y/o WF in NAD... she is moderately anxious today... GENERAL:  Alert & oriented; pleasant & cooperative... HEENT:  [place]/AT, EOM- full & WNL, EACs-clear, TMs-wnl, NOSE-pale w/ clear discharge, THROAT-clear & WNL. NECK:  Supple w/ fairROM; no JVD; normal carotid impulses w/o bruits; palp thyroid w/o change, no lymphadenopathy. CHEST:  Decr BS bilat, clear- no wheezes/ rales/ rhonchi... HEART:  Regular Rhythm; without murmurs/ rubs/ or gallops heard... ABDOMEN:  Soft & nontender; normal bowel sounds; no organomegaly or masses detected. EXT: without deformities, mild arthritic changes; no varicose veins/ +venous insuffic/ no edema. NEURO: CN's intact & no other neuro deficits... some decr ROM neck. SKIN:  neg- w/o lesions...  RADIOLOGY DATA:  Reviewed in the EPIC EMR & discussed w/ the patient...  LABORATORY DATA:  Reviewed in the EPIC EMR & discussed w/ the patient...   Assessment & Plan:    GIB related to M-W tear while on ASA/ Plavix; Plavix stopped, Tx 1u, placed on ProtonixBid & Fe w/ improvement...   OPHTHALMOLOGY>  She was to f/u w/ new eye doctor at [facility], ?if she went, we don't have notes, she can't remember...  HBP>  BP improved on Amlodipine & Losartan50; continue same...  Periph Vasc Dis/ Hx of STROKE>  On ASA & Plavix daily, no cerebral ischemic symptoms...  CHOL>  FLP has been OK on diet alone, keep up the good work...  GOITER>  Denies compressive symptoms or hyper/hypo symptoms; chemically euthyroid as well & following...  DIVERTICULOSIS>  Known severe sigm divertics & reminded to take Metamucil/ Miralax regularly... We  discussed Simethacone for gas symptoms.  DJD>  She had Tramadol  to use prn but prefers OTC meds- Tylenol/ Advil etc...  Osteoporosis>  She gets BMDs from DrNeal, Gyn & was prev on Fosamax; this was stopped after 10+ yrs rx & DrNeal has rec reclast but she has refused rx.  MEMORY LOSS>  She has some atrophy on scans & minor changes on prev MMSE; she has declined Aricept & prev tried OTC memory supplements.  ANXIETY>  As noted she feels that the Alpraz caused trouble thinking but does not want substitute medication for nerves...   Patient's Medications  New Prescriptions   PHOSPHATIDYLSERINE-DHA-EPA (VAYACOG) 100-19.5-6.5 MG CAPS    Take 1 tablet by mouth daily.  Previous Medications   ALPRAZOLAM (XANAX) 0.25 MG TABLET    Take 1/2 to 1 tablet by mouth three times daily as needed for nerves    AMLODIPINE (NORVASC) 10 MG TABLET    Take 1 tablet (10 mg total) by mouth daily.   ASPIRIN 81 MG TABLET    Take 81 mg by mouth daily.   BIOFLAVONOID PRODUCTS (ESTER C PO)    Take 1 tablet by mouth daily.     CALCIUM GLUCONATE 500 MG TABLET    Take 500 mg by mouth daily.    CHOLECALCIFEROL (VITAMIN D) 1000 UNITS CAPSULE    Take 1,000 Units by mouth daily.     FERROUS SULFATE 325 (65 FE) MG TABLET    Take 1 tablet (325 mg total) by mouth 2 (two) times daily with a meal.   FISH OIL-OMEGA-3 FATTY ACIDS 1000 MG CAPSULE    Take 2 g by mouth daily.     GLUCOSAMINE HCL 1000 MG TABS    Take 1 tablet by mouth daily.     MULTIPLE VITAMIN (MULTIVITAMIN PO)    Take 1 tablet by mouth daily.     PANTOPRAZOLE (PROTONIX) 40 MG TABLET    Take 1 tablet (40 mg total) by mouth 2 (two) times daily.   PSYLLIUM (METAMUCIL) 30.9 % POWD    1 tsp daily   Modified Medications   No medications on file  Discontinued Medications   LOSARTAN (COZAAR) 50 MG TABLET    Take 1 tablet (50 mg total) by mouth daily.

## 2012-08-13 NOTE — Telephone Encounter (Signed)
Pt to be called by scheduling re: post stroke appt.  (forwarded written note).

## 2012-08-16 ENCOUNTER — Telehealth: Payer: Self-pay | Admitting: Neurology

## 2012-08-17 ENCOUNTER — Telehealth: Payer: Self-pay | Admitting: Neurology

## 2012-08-17 NOTE — Addendum Note (Signed)
Addended by: Marcellus Scott on: 08/17/2012 04:56 PM   Modules accepted: Orders

## 2012-08-21 ENCOUNTER — Encounter: Payer: Self-pay | Admitting: *Deleted

## 2012-08-21 NOTE — Telephone Encounter (Signed)
Patient wants to know if there any types of injections he can get for brain tumors? Please advise.

## 2012-08-22 NOTE — Telephone Encounter (Signed)
Called patient and she is aware. She was just wanting to know.

## 2012-08-22 NOTE — Telephone Encounter (Signed)
No I'm not aware of any such injections.

## 2012-08-29 ENCOUNTER — Telehealth: Payer: Self-pay | Admitting: Neurology

## 2012-08-29 NOTE — Telephone Encounter (Signed)
Dr.Sethi please call patient she has questions about her brain tumor.

## 2012-08-29 NOTE — Telephone Encounter (Signed)
Reviewed patients MRIs from 02/08/12 and 12/01/11 and spoke to her no evidence of brain tumor on either MRIs .advised to keep f/u appointment with me in few weeks

## 2012-09-11 ENCOUNTER — Ambulatory Visit (INDEPENDENT_AMBULATORY_CARE_PROVIDER_SITE_OTHER): Payer: Medicare Other | Admitting: Pulmonary Disease

## 2012-09-11 ENCOUNTER — Ambulatory Visit (INDEPENDENT_AMBULATORY_CARE_PROVIDER_SITE_OTHER): Payer: Medicare Other

## 2012-09-11 ENCOUNTER — Encounter: Payer: Self-pay | Admitting: Pulmonary Disease

## 2012-09-11 VITALS — BP 120/60 | HR 74 | Temp 98.1°F | Ht 61.0 in | Wt 101.0 lb

## 2012-09-11 DIAGNOSIS — M1990 Unspecified osteoarthritis, unspecified site: Secondary | ICD-10-CM

## 2012-09-11 DIAGNOSIS — I739 Peripheral vascular disease, unspecified: Secondary | ICD-10-CM

## 2012-09-11 DIAGNOSIS — E785 Hyperlipidemia, unspecified: Secondary | ICD-10-CM

## 2012-09-11 DIAGNOSIS — G319 Degenerative disease of nervous system, unspecified: Secondary | ICD-10-CM

## 2012-09-11 DIAGNOSIS — I059 Rheumatic mitral valve disease, unspecified: Secondary | ICD-10-CM | POA: Diagnosis not present

## 2012-09-11 DIAGNOSIS — I10 Essential (primary) hypertension: Secondary | ICD-10-CM | POA: Diagnosis not present

## 2012-09-11 DIAGNOSIS — E049 Nontoxic goiter, unspecified: Secondary | ICD-10-CM

## 2012-09-11 DIAGNOSIS — F411 Generalized anxiety disorder: Secondary | ICD-10-CM

## 2012-09-11 DIAGNOSIS — M810 Age-related osteoporosis without current pathological fracture: Secondary | ICD-10-CM

## 2012-09-11 MED ORDER — AMLODIPINE BESYLATE 5 MG PO TABS: 5.0000 mg | ORAL_TABLET | Freq: Every day | ORAL | Status: DC

## 2012-09-11 MED ORDER — FUROSEMIDE 20 MG PO TABS: 20.0000 mg | ORAL_TABLET | Freq: Two times a day (BID) | ORAL | Status: DC

## 2012-09-11 NOTE — Patient Instructions (Addendum)
Today we updated your med list in our EPIC system...    Continue your current medications the same...  We decided to change the Norvasc10mg  (Amlodipine) to the 5mg  pills- take one tab daily...  And we are ADDING LASIX (Furosemide) fluid pill 20mg - take one tab daily...  Today we checked your FASTING lipid profile per your request...    We will contact you w/ the results when available...   Call for any questions...  Let's plan a follow up visit in 2-65mo, sooner if needed for problems.Marland KitchenMarland Kitchen

## 2012-09-11 NOTE — Progress Notes (Signed)
Subjective:    Patient ID: [name], female    DOB: 05-11-1924, 79 y.o.   MRN: [id number]  HPI 79 y/o WF here for a follow up visit... she has multiple medical problems as noted below...  Followed for general medical purposes w/ hx of HBP, mild MVP, ASPVD, Goiter, Divertics, DJD w/ left THR, Osteoporosis, hx right occipital stoke & 3rd N paresis, Memory loss, & mod severe anxiety...  ~  February 28, 2011:  287mo ROV & she is doing well overall just c/o some gas & we discussed Simethacone preps for this- Mylicon, GasX, Phazyme, etc...  BP well controlled on Amlodip & she denies CP, palpit, SOB, edema; she continues to exercise regularly at the gym; she remains on Plavix & denies cerebral ischemic symptoms; we reviewed labs from 7/12 and FLP ok on her diet + FishOil and Thyroid remains wnl; hx DJD w/ prev left THR & she uses OTC analgesics as needed; she is still not using the Alpraz anxiolytic...  She had the Shingles vaccine & the Flu shot this yr; Plavix & Amlodip refilled per request...  ~  Jul 29, 2011:  87mo ROV & [name] is added-on for mult symptoms> states BP has been up (readings 150-160 at home) & she is very concerned "I can feel it when it's up- I'm more anxious, not as calm" she will take 1/4th of an Alpraz0.5mg  tab & this helps she says;  Also c/o eyes feel strained & left arm/ shoulder discomfort- but it's not too bad & she refuses Ortho eval... We decided to add LOSARTAN 50mg /d to her regimen & recheck her BP in about 6 weeks... We reviewed her prob list, meds, Xrays & labs>> BMD 5/13:  Severe osteoporosis w/ TScores -2.9 in Spine & -3.3 in Femur; also measured -5.2 in forearm/Radius... rec to start Reclast yearly...  ~  September 19, 2011:  6wk ROV & [name] notes some trouble w/ her memory (see below) and is here to review the need for Reclast to treat her osteoporosis (see below);  Oddly enough- while she refuses interventions for these important medical issues, her CC today is a minor  discoloration of her nails that concerns her & she is referred to [facility] for this analysis...    HBP> on Norvasc10, Cozaar50;  BP= 142/72 & she denies CP, palpit, SOB, edema...    MVP> she denies CP, palpit, and exercises daily at he gym...    ASPVD> on Plavix75; she denies cerebral ischemic symptoms...    Chol> on FishOil; FLP looks ok showing TChol 190, TG 88, HDL 61, LDL 111    Goiter> palp thyroid, no change, and euthyroid w/ TSH=1.29...    GI> Divertics, Hems> on Metamucil;     DJD> on Glucosamine etc; she denies neck discomfort etc & still works out daily in [employer]...    Osteoporosis> on Calcium, VitD, MVI; BMD 5/13 showed severe osteoporosis w/ TScores -2.9 in Spine & -3.3 in Femur; also measured -5.2 in forearm/Radius> she was asked to start Reclast & show TP 5/13 but couldn't make a decision; now she tells me she has decided NOT to take Reclast or any other osteoporosis therapy; I carefully reviewed the serious consequences of her decision & offered her second opinions w/ GYN, Endocrine, Rheum, whatever but she declines; I further offered to explain to her son but she also declined this intervention...    Hx stroke & cerebral atrophy> on Plavix75; she is noting problems w/ her memory as she  is having to write herself notes; offered Aricept vs Neuro but she declines new meds or referral...    Anxiety> on Xanax0.25 prn; she'l take 1/2 tab prn & notes that it helps... We reviewed prob list, meds, xrays and labs> see below>> LABS 6/13:  FLP- at goals x LDL=111;  Chems- ok;  CBC- ok;  TSH=1.29;  VitD=50  ~  March 13, 2012:  59mo ROV & [name] is stable but has mult somatic complaints that she blames on her Plavix rx;  We reviewed the following medical problems during today's office visit >>     HBP> on Norvasc10, Cozaar50;  BP= 142/72 & she denies CP, palpit, SOB, edema...    MVP> she denies CP, palpit, and exercises daily at he gym...    ASPVD> on ASA81, Plavix75; she denies cerebral ischemic  symptoms...    Chol> on FishOil; she has refused any & all statin meds; FLP 6/13 looked ok showing TChol 190, TG 88, HDL 61, LDL 111    Goiter> palp thyroid, no change, and euthyroid w/ TSH=1.29...    GI> Divertics, Hems> on Metamucil;     DJD> on Glucosamine, bioflavinoids, etc; she denies neck discomfort etc & still works out daily in the gym...    Osteoporosis> on Calcium, VitD, MVI; BMD 5/13 showed severe osteoporosis w/ TScores -2.9 in Spine & -3.3 in Femur; also measured -5.2 in forearm/Radius> she was asked to start Reclast & saw TP 5/13 but couldn't make a decision; now she tells me she has decided NOT to take Reclast or any other osteoporosis therapy; I carefully reviewed the serious consequences of her decision & offered her second opinions w/ GYN, Endocrine, Rheum, whatever but she declines; I further offered to explain to her son but she also declined this intervention...    Hx stroke & cerebral atrophy> on ASA81, Plavix75; she had right occipital infarct 9/10; she is followed by Neuro, Pearlean Brownie- note 8/13 from NP is reviewed> MMSE 24/30, mult somatic complaints, anxious & wanted MRI since several relatives w/ brain tumors- MRI 11/13 showed chr microvasc ischemic dis & generalized cerebral atrophy, no acute ischemia, no lesions seen...    Anxiety> on Xanax0.25 prn; she'll take 1/2 tab prn & notes that it helps... We reviewed prob list, meds, xrays and labs> see below for updates >> she had the 2013 Flu vaccine 10/13...  ~  Aug 09, 2012:  42mo ROV & post hosp check> [name] was Adm 5/3 - 07/31/12 by [facility] w/ hematemesis & rectal bleeding assoc w/ weakness & lightheadedness with an initial  Hg=9.2 that dropped to 6.5; Aspirin & Plavix were held (she wanted to blame the Plavix for the bleeding); EGD by DrGessner showed a Mallory-Weiss tear in upper stomach & she was placed on PPI, transfused 1u, started on Iron & Hg improved to 8.8 by discharge..  Since her disch she is improved but still confused &  here w/ her son from [place]...  We reviewed the following medical problems during today's office visit >>     HBP> on Norvasc10 & off prev Cozaar50 (she stopped this on her own- confused about meds);  BP= 110/60 & she denies CP, palpit, SOB, edema; we decided to leave the ARB off as it looks like BP adeq controlled on Amlod alone...    MVP> she denies CP, palpit, and exercises daily at he gym...    ASPVD> Plavix was stopped 5/14 hosp & she will restart ASA81; she denies cerebral ischemic symptoms.Marland KitchenMarland Kitchen  Chol> on FishOil; she has refused any & all statin meds; FLP 6/13 looked ok showing TChol 190, TG 88, HDL 61, LDL 111    Goiter> palp thyroid, no change, and euthyroid w/ TSH=1.29 ([id number])...    GI- Mallory-Weiss tear w/ GIB 5/13> on Protonix40Bid & off Plavix now; denies abd pain, dysphagia, n/v, etc...    Divertics, Hems> on Metamucil;     DJD> on Glucosamine, bioflavinoids, etc; she denies neck discomfort etc & still works out daily in the gym...    Osteoporosis> on Calcium, VitD, MVI; BMD 5/13 showed severe osteoporosis w/ TScores -2.9 in Spine & -3.3 in Femur; also measured -5.2 in forearm/Radius> she was asked to start Reclast & saw TP 5/13 but couldn't make a decision; she tells me she has decided NOT to take Reclast or any other osteoporosis therapy; I carefully reviewed the serious consequences of her decision & offered her second opinions w/ GYN, Endocrine, Rheum, whatever but she declines; I further offered to explain to her son but she also declined this intervention...    Hx stroke & cerebral atrophy> on ASA81 alone now as the Plavix was stopped 5/14 due to GIB; she had right occipital infarct 9/10; she is followed by Neuro, Pearlean Brownie- note 10/13 & 12/13 are reviewed> MMSE 24/30, mult somatic complaints, anxious & wanted MRI since several relatives w/ brain tumors- MRI 11/13 showed chr microvasc ischemic dis & generalized cerebral atrophy, no acute ischemia, no lesions seen; EEG 11/13 was neg-  no seizure activity; she claimed Plavix caused memory loss but DrSethi set her straight and recommended ASA81, strict BP control, fish oil, work puzzles, etc...    Anxiety> on Xanax0.25 prn; she'll take 1/2 tab prn & notes that it helps... We reviewed prob list, meds, xrays and labs> see below for updates >>  LABS 5/14:  Chems- wnl;  CBC- improved w/ Hg=12.4, Fe=77 (25%sat)...   ~  September 11, 2012:  32mo ROV & last OV we added Vyacog- one tab daily but she forgot & never filled the Rx (re-written today); she wonders about DHA for the brain & she will ask DrSethi at her next visit "he said I have clogged arteries" so she is worried about being off the Plavix (see above- GI bleed w/ Hg down to 6.5 from Mallory-Weiss tear on EGD)...     She is concerned about her ankles swelling- noted late in the day, & they always go down by the next morn; we discussed Ven Insuffic & the need for Sodium restriction, elevation, support hose, but she wants a Diuretic- decided to decr Norvasc to 5mg /d 7 add Lasix20 Qam prn swelling...     Family wants her Chol checked she says & FLP today revealed TChol 213, TG 77, HDL 77, LDL 123 and she continues to state her refusal of cholesterol meds... We reviewed prob list, meds, xrays and labs> see below for updates >>  LABS 6/14:  FLP- not quite at goals on diet alone but she does not want Statin Rx...          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 eval by Neurology/ stroke team showed tiny right occipital infarct, diffuse intra /extracranial atherosclerotic dis (but no focal stenoses or interventionable lesions) & a part left 3rd nerve palsy as well... PLAVIX was added to her ASA therapy... ~  11/10:  f/u DrSethi stopped ASA, continued Plavix for her cerebrovasc dis... ~  11/10:  [facility]- he predicted good prognosis for spont  recovery of left eye movement. ~  1/11:  her left 3rd nerve palsy has resolved...  ALLERGIC RHINITIS (ICD-477.8) - uses OTC antihistamines infrequently...  HYPERTENSION, MILD (ICD-401.1) - she has hx of HBP and on NORVASC 10mg /d...  ~  CXR 1/09 showed normal heart size, clear lungs, NAD... ~  12/12:  BP= 128/72 today and 130's/ 70's at home... denies HA, fatigue, CP, palipit, dizziness, syncope, dyspnea; min edema noted. ~  5-6/13:  BP= 134/70 but she is quite concerned about BPs up to 150-160 at home; we decided to add LOSARTAN 50mg /d to her meds & f/u in 6 weeks... ~  12/13: on Norvasc10, Cozaar50;  BP= 142/72 & she denies CP, palpit, SOB, edema. ~  5/14: post hosp visit on Norvasc10 & off prev Cozaar50 (she stopped this on her own- confused about meds);  BP= 110/60 & she denies CP, palpit, SOB, edema; we decided to leave the ARB off as it looks like BP adeq controlled on Amlod alone. ~  6/14: she is concerned about VI & edema late in the day; Rec to decr Norvasc to 5mg /d & added prn Lasix20 for swelling that doesn't go down overnight... Reminded to elim sodium etc...  Hx of MITRAL VALVE PROLAPSE (ICD-424.0) - baseline EKG w/ NSR, WNL;  Neg stress thallium 4/93... she denies CP, palpit, SOB, etc... she exercises regularly walking 3-4 miles and going to the gym. ~  2DEcho 9/10 showed trivial prolapse of the post leaflet, norm LVF w/ EF= 65-70%, no regional wall motion abnormalities...  PERIPHERAL VASCULAR DISEASE (ICD-443.9) - on ASA81, PLAVIX 75mg /d... CTAbd 8/06 showed thickening of Ao wall & prob plaque;  CDopplers 7/08 showed 0-39% bilat ICA stenoses & plaque in Rt subclavian art (plus Rt Thyroid Mass). ~  MRI/ MRA Brain & Neck 9/10 showed intra & extracranial atherosclerotic changes w/o interventionable lesions & no hemodynamically signif stenoses in the neck...  VENOUS INSUFFICIENCY (ICD-459.81) - she had a vein stripping yrs ago> she follows a low sodium diet & has no edema at this  time...  HYPERCHOLESTEROLEMIA, BORDERLINE (ICD-272.4) - on diet + FISH OIL, she does not want meds. ~  FLP 1/11 showed TChol 199, TG 109, HDL 66, LDL 112 ~  FLP 7/11 showed TChol 212, TG 66, HDL 69, LDL 133 ~  FLP 1/12 showed TChol 208, TG 106, HDL 66, LDL 124 ~  FLP 7/12 showed TChol 198, TG 47, HDL 75, LDL 114 ~  FLP 6/13 showed TChol 190, TG 88, HDL 61, LDL 111 ~  FLP 6/14 on diet alone showed TChol 213, TG 77, HDL 77, LDL 123... She continues to decline med rx.  GOITER, UNSPECIFIED (ICD-240.9) - Old CXR's show deviation of trachea to left by thyroid & CDoppler showed right thyroid mass... she has no local symptoms, swallowing difficulty & is clinically euthyroid...  ~  labs 6/09 showed TSH= 1.04 ~  labs 6/10 showed TSH= 0.98 ~  labs 7/11 showed TSH= 1.40 ~  labs 7/12 showed TSH= 1.30 ~  Labs 6/13 showed TSH= 1.29  GIB secondary to MALLORY-WEISS TEAR >> this occurred 5/14 after N/V of ?reason => hemetemesis & blood in stool => assoc w/ weakness & lightheadedness with an initial Hg=9.2 that dropped to 6.5; Aspirin & Plavix were held (she wanted to blame the Plavix for the bleeding); EGD by DrGessner showed  a Mallory-Weiss tear in upper stomach & she was placed on PPI, transfused 1u, started on Iron & Hg improved to 8.8 by discharge.  DIVERTICULOSIS OF COLON (ICD-562.10) & HEMORRHOIDS (ICD-455.6) - Hx of very tortuous & redundant colon w/ severe sigmoid diverticulosis;  last colonoscopy w/ pediatric scope 10/98 was difficult & subseq barium enema showed severe divertics otherwise neg;  Rx fiber, anusol, etc...  CTAbd 1/03 w/ divertics otherwise neg;  she is reminded to take the MIRALAX/ Metamucil regularly.  UNSPECIFIED CYSTITIS (ICD-595.9) - Urology eval 8/09 by DrDalstadt...  OSTEOARTHRITIS (ICD-715.90) - DJD in both hips L>Rt w/ LTHR done 2/09 by DrAlusio... she takes Glucosamine, Calcium, MVI, VitC & VitD. ~  8/11: c/o neck pain & CSpine films by DrSethi showed DDD, 3mm anterolisthesis  C4 on C5, osteopenia... ~  2/12:  c/o continued neck discomfort ?eval by neurosurg she said?, advised rest, heat, Tramadol Prn.   OSTEOPOROSIS (ICD-733.00) - BMD by DrNeal 7/02 showed TScores -2.3 to -3.2;  regular f/u BMD from DrNeal - last 8/08 w/ TScores -2.6 to -3.2;  Vit D level checked by GYN in past & was 53 here 6/09... ~  12/09:  she reports that she stopped her Fosamax after 12 yrs Rx per DrNeal & he has rec Reclast but she is undecided & inclined to wait til next BMD and see if there has been any deterioration in measured bone density... ~  BMD 5/13:  Severe osteoporosis w/ TScores -2.9 in Spine & -3.3 in Femur; also measured -5.2 in forearm/Radius... rec to start Reclast yearly... ~  5/13:  She has declined Reclast & all other osteoporosis interventions offered to her; she has similarly declined referral for 2nd opinions etc... ~  12/13: we reviewed prev data & again offered Reclast rx, & referrals for 2nd opinions; she declines all interventions...  CVA (ICD-434.91) - Hosp 9/10 w/ blurry vision & exam showing mild field cut & left eye strabismus... eval by Neurology/ stroke team showed tiny right occipital infarct, diffuse intra /extracranial atherosclerotic dis (but no focal stenoses or interventionable lesions) & a part left 3rd nerve palsy as well... PLAVIX was added to her ASA therapy... SEE DC SUMMARY  CEREBRAL ATROPHY (ICD-331.9) - CTBrain 10/07 showed mild atrophy, otherw neg... she denies memory problems- with minor changes on MMSE testing... 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 ~  6/10: when asked about her memory she states "it depends on what day it is" ~  1/11:  I see signs of mild deterioration in memory but she denies- eg. she couldn't recall what DrTMartin told her [facility] about her 3rd nerve paresis, and she insisted that her mother lived to 32, then later corrected herself (she lived to 28). ~  2/12: similar signs of memory impairment w/ her c/o  blurry vision & neck pain> she couldn't recall prev w/u by neuro or results. ~  F/u Neuro(Sethi)- note 8/13, 10/13, & 12/13 from NP & DrSethi> MMSE 24/30, mult somatic complaints, anxious & wanted MRI since several relatives w/ brain tumors- MRI 11/13 showed chr microvasc ischemic dis & generalized cerebral atrophy, no acute ischemia, no lesions seen...  ANXIETY (ICD-300.00) - she has severe stress having found her son at home after a suicide... she has ALPRAZOLAM for Prn use but doesn't take it... she was rec to try Zoloft per Neuro but she never filled it.  ANEMIA related to GIB 5/14 from M-W tear >> ~  Previous CBCs showed Hg= 12.8 -  14.2 ~  Adm 5/14 w/ GIB and Hg nadir = 6.5.Marland KitchenMarland Kitchen Tx 1u & placed on Fe w/ improvement to 8.8 by disch... ~  Labs 5/14 in office follow up showed Hg= 12.4   Past Surgical History  Procedure Laterality Date  . Breast lumpectomy  1960's    benign breast biopsy  . Cholecystectomy  1980    at [facility].  Marland Kitchen Appendectomy  1980    at [facility].  . Abdominal hysterectomy  07/1980    hyst and rectocele repair by Dr. Dewaine Conger  . Oculoplastic eye surgery  12/1997    in [place]  . Vv stripping years ago    . Esophagogastroduodenoscopy N/A 07/29/2012    Procedure: ESOPHAGOGASTRODUODENOSCOPY (EGD);  Surgeon: Iva Boop, MD;  Location: [facility];  Service: Endoscopy;  Laterality: N/A;    Outpatient Encounter Prescriptions as of 09/11/2012  Medication Sig Dispense Refill  . ALPRAZolam (XANAX) 0.25 MG tablet Take 1/2 to 1 tablet by mouth two times daily as needed for nerves      . amLODipine (NORVASC) 10 MG tablet Take 1 tablet (10 mg total) by mouth daily.  30 tablet  11  . aspirin 81 MG tablet Take 81 mg by mouth daily.      Marland Kitchen Bioflavonoid Products (ESTER C PO) Take 1 tablet by mouth daily.        . calcium gluconate 500 MG tablet Take 500 mg by mouth daily.       . Cholecalciferol (VITAMIN D) 1000 UNITS capsule Take 1,000 Units by mouth daily.        . ferrous  sulfate 325 (65 FE) MG tablet Take 325 mg by mouth daily with breakfast.      . fish oil-omega-3 fatty acids 1000 MG capsule Take 2 g by mouth daily.        . Glucosamine HCl 1000 MG TABS Take 1 tablet by mouth daily.        . Multiple Vitamin (MULTIVITAMIN PO) Take 1 tablet by mouth daily.        . pantoprazole (PROTONIX) 40 MG tablet Take 1 tablet (40 mg total) by mouth 2 (two) times daily.  60 tablet  1  . Phosphatidylserine-DHA-EPA (VAYACOG) 100-19.5-6.5 MG CAPS Take 1 tablet by mouth daily.  6 capsule  0  . Psyllium (METAMUCIL) 30.9 % POWD 1 tsp daily        No facility-administered encounter medications on file as of 09/11/2012.    Allergies  Allergen Reactions  . Augmentin (Amoxicillin-Pot Clavulanate)   . Septra (Sulfamethoxazole-Tmp Ds)   . Shellfish Allergy   . Sulfamethoxazole W-Trimethoprim Other (See Comments)    allergy to Sulfa w/ flu-like illness.  . Amoxicillin-Pot Clavulanate Other (See Comments)     "fuzzy-headed"    Review of Systems        See HPI - all other systems neg except as noted... The patient denies anorexia, fever, weight loss, weight gain, vision loss, decreased hearing, hoarseness, chest pain, syncope, dyspnea on exertion, peripheral edema, prolonged cough, headaches, hemoptysis, abdominal pain, melena, hematochezia, severe indigestion/heartburn, hematuria, incontinence, muscle weakness, suspicious skin lesions, transient blindness, difficulty walking, depression, unusual weight change, abnormal bleeding, enlarged lymph nodes, and angioedema.    Objective:   Physical Exam     WD,Thin, 79 y/o WF in NAD... she is moderately anxious today... GENERAL:  Alert & oriented; pleasant & cooperative... HEENT:  [place]/AT, EOM- full & WNL, EACs-clear, TMs-wnl, NOSE-pale w/ clear discharge, THROAT-clear & WNL. NECK:  Supple w/ fairROM; no JVD; normal carotid impulses w/o bruits; palp thyroid w/o change, no lymphadenopathy. CHEST:  Decr BS bilat, clear- no wheezes/  rales/ rhonchi... HEART:  Regular Rhythm; without murmurs/ rubs/ or gallops heard... ABDOMEN:  Soft & nontender; normal bowel sounds; no organomegaly or masses detected. EXT: without deformities, mild arthritic changes; no varicose veins/ +venous insuffic/ no edema. NEURO: CN's intact & no other neuro deficits... some decr ROM neck. SKIN:  neg- w/o lesions...  RADIOLOGY DATA:  Reviewed in the EPIC EMR & discussed w/ the patient...  LABORATORY DATA:  Reviewed in the EPIC EMR & discussed w/ the patient...   Assessment & Plan:    OPHTHALMOLOGY>  She was to f/u w/ new eye doctor at [facility], ?if she went, we don't have notes, she can't remember...  HBP>  BP improved on Amlodipine & Losartan50; continue same...  Periph Vasc Dis/ Hx of STROKE>  On ASA & Plavix daily, no cerebral ischemic symptoms...  CHOL>  FLP has been OK on diet alone, keep up the good work...  GOITER>  Denies compressive symptoms or hyper/hypo symptoms; chemically euthyroid as well & following...  GIB related to M-W tear while on ASA/ Plavix; Plavix stopped, Tx 1u, placed on ProtonixBid & Fe w/ improvement...  DIVERTICULOSIS>  Known severe sigm divertics & reminded to take Metamucil/ Miralax regularly... We discussed Simethacone for gas symptoms.  DJD>  She had Tramadol to use prn but prefers OTC meds- Tylenol/ Advil etc...  Osteoporosis>  She gets BMDs from DrNeal, Gyn & was prev on Fosamax; this was stopped after 10+ yrs rx & DrNeal has rec reclast but she has refused rx.  MEMORY LOSS>  She has some atrophy on scans & mild changes on prev MMSE; she has declined Aricept & prev tried OTC memory supplements=> Rec Vyacog  ANXIETY>  As noted she feels that the Alpraz caused trouble thinking but does not want substitute medication for nerves...   Patient's Medications  New Prescriptions   AMLODIPINE (NORVASC) 5 MG TABLET    Take 1 tablet (5 mg total) by mouth daily.   CLOPIDOGREL (PLAVIX) 75 MG TABLET     Take one tablet every other day.  (Alternating Aspirin 81 mg every other day)   FUROSEMIDE (LASIX) 20 MG TABLET    Take 1 tablet (20 mg total) by mouth 2 (two) times daily.  Previous Medications   ASPIRIN 81 MG TABLET    Take 81 mg by mouth daily.   BIOFLAVONOID PRODUCTS (ESTER C PO)    Take 1 tablet by mouth daily.     CALCIUM GLUCONATE 500 MG TABLET    Take 500 mg by mouth daily.    CHOLECALCIFEROL (VITAMIN D) 1000 UNITS CAPSULE    Take 1,000 Units by mouth daily.     FERROUS SULFATE 325 (65 FE) MG TABLET    Take 325 mg by mouth daily with breakfast.   FISH OIL-OMEGA-3 FATTY ACIDS 1000 MG CAPSULE    Take 2 g by mouth daily.     GLUCOSAMINE HCL 1000 MG TABS    Take 1 tablet by mouth daily.     MULTIPLE VITAMIN (MULTIVITAMIN PO)    Take 1 tablet by mouth daily.     PANTOPRAZOLE (PROTONIX) 40 MG TABLET    Take 1 tablet (40 mg total) by mouth 2 (two) times daily.   PHOSPHATIDYLSERINE-DHA-EPA (VAYACOG) 100-19.5-6.5 MG CAPS    Take 1 tablet by mouth daily.   PSYLLIUM (METAMUCIL) 30.9 % POWD  1 tsp daily   Modified Medications   Modified Medication Previous Medication   ALPRAZOLAM (XANAX) 0.25 MG TABLET ALPRAZolam (XANAX) 0.25 MG tablet      Take 1/2 to 1 tablet by mouth two times daily as needed for nervesTake 1/2 to 1 tablet by mouth two times daily as needed for nerves    Take 1/2 to 1 tablet by mouth two times daily as needed for nerves  Discontinued Medications   AMLODIPINE (NORVASC) 10 MG TABLET    Take 1 tablet (10 mg total) by mouth daily.

## 2012-09-12 ENCOUNTER — Ambulatory Visit: Payer: Medicare Other | Admitting: Pulmonary Disease

## 2012-09-12 LAB — LIPID PANEL: Triglycerides: 77 mg/dL (ref 0.0–149.0)

## 2012-09-12 LAB — LDL CHOLESTEROL, DIRECT: Direct LDL: 123.4 mg/dL

## 2012-09-13 ENCOUNTER — Other Ambulatory Visit: Payer: Self-pay | Admitting: Pulmonary Disease

## 2012-09-14 ENCOUNTER — Telehealth: Payer: Self-pay | Admitting: Pulmonary Disease

## 2012-09-14 MED ORDER — ALPRAZOLAM 0.25 MG PO TABS: ORAL_TABLET | ORAL | Status: DC

## 2012-09-14 NOTE — Telephone Encounter (Signed)
Last OV on 09/11/12. Pt request refill for xanax. Last refill seen was in 2012. Refill sent and pt is aware. [name], CMA

## 2012-09-18 ENCOUNTER — Telehealth: Payer: Self-pay | Admitting: Pulmonary Disease

## 2012-09-18 MED ORDER — CLOPIDOGREL BISULFATE 75 MG PO TABS: ORAL_TABLET | ORAL | Status: DC

## 2012-09-18 NOTE — Telephone Encounter (Signed)
Spoke with pt and notified that rx for Plavix 75mg  every other day was sent to pharmacy

## 2012-09-18 NOTE — Telephone Encounter (Signed)
Per SN---  For her eyes,  Try natural tears prn  We will compromise---  Take 1 aspirin 81 mg  Every other day plavix 75 mg  Every other day  Alternate taking these meds.  Take them on alternating days. Remind her that she was in favor of stopping the plavix due to effects on memory and then GIB.

## 2012-09-18 NOTE — Telephone Encounter (Signed)
Pt aware of recs. She voiced her understanding and needed nothing further.  

## 2012-09-18 NOTE — Telephone Encounter (Signed)
Called and spoke with pt and she stated that she has been having some confusion and blurry eyes x 3 days.  She is requesting something be called in. She stated that SN has given her something for her circulation, and pt feels that she needs to stay on plavix to help with her circulation.  She is aware that the plavix was stopped in the hospital and she was started on 81 mg asa.  SN please advise. Thanks  Allergies  Allergen Reactions  . Augmentin (Amoxicillin-Pot Clavulanate)   . Septra (Sulfamethoxazole-Tmp Ds)   . Shellfish Allergy   . Sulfamethoxazole W-Trimethoprim Other (See Comments)    allergy to Sulfa w/ flu-like illness.  . Amoxicillin-Pot Clavulanate Other (See Comments)     "fuzzy-headed"

## 2012-09-24 ENCOUNTER — Telehealth: Payer: Self-pay | Admitting: Pulmonary Disease

## 2012-09-24 DIAGNOSIS — Z7902 Long term (current) use of antithrombotics/antiplatelets: Secondary | ICD-10-CM | POA: Diagnosis not present

## 2012-09-24 NOTE — Telephone Encounter (Signed)
Called and spoke with pt and she stated that the pharmacy told her that they are checking to make sure that the plavix is working for each pt that is taking this and the pharmacy will follow up with SN about this pt.    i have called the pts pharmacy and they stated that they are following pts that are currently on the plavix.  They will swab the pt and this is checking for a certain genotype to see if they need to cont taking the plavix.  This report will be sent to the pts doctor.  Pt was swabbed today.  Will keep a check on these results.

## 2012-10-09 ENCOUNTER — Telehealth: Payer: Self-pay | Admitting: Neurology

## 2012-10-10 ENCOUNTER — Telehealth: Payer: Self-pay

## 2012-10-10 NOTE — Telephone Encounter (Signed)
Patient said Dr. Pearlean Brownie told her she had some clogged arteries. Patient made appointment some time back. Now she is having trouble orienting self while driving or in new situations. This is of great concern to her because she lives alone. She would like Dr. Pearlean Brownie to see if he can get her in for an earlier appointment OR if he would recommend what she could do to try to improve her symptoms until she comes in for her appointment in September. Please advise.

## 2012-10-10 NOTE — Telephone Encounter (Signed)
Reviewed telephone note with Dr. Marlis Edelson Medical Assistant. Patient has been rescheduled for August 6 at 3:00 p.m. I called and let patient know. She asked if she could keep appointment With Dr. Pearlean Brownie. I suggested she should if she felt that would be best. I learned that her apt. Was cancelled. We will notify her at next OV that it has been cancelled and reschedule at that time.

## 2012-10-23 ENCOUNTER — Telehealth: Payer: Self-pay | Admitting: Pulmonary Disease

## 2012-10-23 MED ORDER — PHOSPHATIDYLSERINE-DHA-EPA 100-19.5-6.5 MG PO CAPS: 1.0000 | ORAL_CAPSULE | Freq: Every day | ORAL | Status: DC

## 2012-10-23 NOTE — Telephone Encounter (Signed)
09/11/12 last ov. Spoke with the pt and she states she is having some blurry vision and memory loss and that this has happened to her several times in the past and Dr. Kriste Basque prescribed her something that helped and she is asking for an rx for this medication. The pt is not sure of the name of the medication. I see where she was prescribed Vyacog in the past? Pt states she is not currently taking this medication and thinks this may be what helped. Please advise. [name], CMA Allergies  Allergen Reactions  . Augmentin (Amoxicillin-Pot Clavulanate)   . Septra (Sulfamethoxazole-Tmp Ds)   . Shellfish Allergy   . Sulfamethoxazole W-Trimethoprim Other (See Comments)    allergy to Sulfa w/ flu-like illness.  . Amoxicillin-Pot Clavulanate Other (See Comments)     "fuzzy-headed"

## 2012-10-23 NOTE — Telephone Encounter (Signed)
ATC pt again. I sent rx to pharmacy. WCB. [name], CMA

## 2012-10-23 NOTE — Telephone Encounter (Signed)
Pt called back & states that PLAVIX does not work for her & asks not to have this called in for her.    [name]

## 2012-10-23 NOTE — Telephone Encounter (Signed)
ATC pt at # provided - Rang several times with no answer and no option to leave msg.  WCB

## 2012-10-23 NOTE — Telephone Encounter (Signed)
Per SN---  Ok to refill the Vyacog  #30   1 daily and refill prn.  thanks

## 2012-10-24 NOTE — Telephone Encounter (Signed)
Called, spoke with pt - Advised Vyacog rx sent to pharm.  She is to call back if symptoms do not improve or worsen.  She verbalized understanding and voiced no further questions or concerns at this time.

## 2012-10-31 ENCOUNTER — Encounter: Payer: Self-pay | Admitting: Nurse Practitioner

## 2012-10-31 ENCOUNTER — Telehealth: Payer: Self-pay | Admitting: Nurse Practitioner

## 2012-10-31 ENCOUNTER — Ambulatory Visit (INDEPENDENT_AMBULATORY_CARE_PROVIDER_SITE_OTHER): Payer: Medicare Other | Admitting: Nurse Practitioner

## 2012-10-31 VITALS — BP 140/73 | HR 85 | Temp 98.6°F | Ht 62.0 in | Wt 99.0 lb

## 2012-10-31 DIAGNOSIS — F03B0 Unspecified dementia, moderate, without behavioral disturbance, psychotic disturbance, mood disturbance, and anxiety: Secondary | ICD-10-CM | POA: Insufficient documentation

## 2012-10-31 DIAGNOSIS — F03A0 Unspecified dementia, mild, without behavioral disturbance, psychotic disturbance, mood disturbance, and anxiety: Secondary | ICD-10-CM

## 2012-10-31 DIAGNOSIS — F0390 Unspecified dementia without behavioral disturbance: Secondary | ICD-10-CM | POA: Diagnosis not present

## 2012-10-31 MED ORDER — DONEPEZIL HCL 5 MG PO TABS: 5.0000 mg | ORAL_TABLET | Freq: Every day | ORAL | Status: DC

## 2012-10-31 NOTE — Progress Notes (Signed)
[facility]  PATIENT: [name] DOB: [birth year]   HISTORY FROM: patient, chart REASON FOR VISIT: routine follow up  HISTORY OF PRESENT ILLNESS:  79 year old right handed Caucasian female with long-standing mild memory difficulties likely due to 8 collated mild cognitive impairment. Remote history of right occipital infarct.  03/15/12 (PS): She returns for followup after her last visit with me on 01/18/2012. She states that she takes Plavix though I do not find listed on the medication list. She feels Plavix is causing her to have memory loss. I spent a lot of time explaining why this was not correct. She remains quite active and is independent in activities of daily living. She does to the gym 4 times a week and take special regularly now. She had blood work done on 01/18/2012 in which he showed normal TSH, homocystine, vitamin B12 and RPR was negative. An EEG dated 01/30/2012 showed no elective form activity and was normal. MRI scan of the brain 02/08/2012 showed mild changes of chronic microvascular ischemia and generalized cerebral atrophy. No significant changes compared with prior scan from 12/01/2011. She has noted new complaints today.    10/31/12 (LL): Patient returns to office for follow up of memory loss.  States she is having confusion at times and Dr. Kriste Basque has prescribed a medication to help her memory that starts with a "V".  She complains that she has "this feeling" around her eyes which she has trouble putting into words.  She states that it is not pain, but replies "yes" to a description of pressure.  She states that it is worst in the mornings.  She is unclear about what medications she is taking.  She did not drive to the office today (called a cab) but states that she does drive sometimes.  She reports that she still goes to the gym 4 days a week and stays active.  She reports drinking pure beet juice that she gets at [employer] to help with her  memory.  She states that she is independent with all of her ADL's but lives alone and her sons live in [place].    REVIEW OF SYSTEMS: Full 14 system review of systems performed and notable only for: constitutional: N/A  cardiovascular: swelling in legs respiratory: N/A endocrine: N/A  ear/nose/throat: hearing loss (some)  Eyes; Blurred vision Hematology/Lymph: easy bleeding, easy bruising musculoskeletal: N/A skin: N/A genitourinary: N/A Gastrointestinal: N/A allergy/immunology: N/A neurological: anxiety sleep: N/A psychiatric: N/A   ALLERGIES: Allergies  Allergen Reactions  . Augmentin (Amoxicillin-Pot Clavulanate)   . Septra (Sulfamethoxazole-Tmp Ds)   . Shellfish Allergy   . Sulfamethoxazole W-Trimethoprim Other (See Comments)    allergy to Sulfa w/ flu-like illness.  . Amoxicillin-Pot Clavulanate Other (See Comments)     "fuzzy-headed"    HOME MEDICATIONS: Outpatient Prescriptions Prior to Visit  Medication Sig Dispense Refill  . ALPRAZolam (XANAX) 0.25 MG tablet Take 1/2 to 1 tablet by mouth two times daily as needed for nervesTake 1/2 to 1 tablet by mouth two times daily as needed for nerves  60 tablet  1  . amLODipine (NORVASC) 5 MG tablet Take 1 tablet (5 mg total) by mouth daily.  30 tablet  11  . aspirin 81 MG tablet Take 81 mg by mouth daily.      Marland Kitchen Bioflavonoid Products (ESTER C PO) Take 1 tablet by mouth daily.        . calcium gluconate 500 MG tablet Take 500 mg by mouth daily.       Marland Kitchen  Cholecalciferol (VITAMIN D) 1000 UNITS capsule Take 1,000 Units by mouth daily.        . clopidogrel (PLAVIX) 75 MG tablet Take one tablet every other day.  (Alternating Aspirin 81 mg every other day)  15 tablet  5  . ferrous sulfate 325 (65 FE) MG tablet Take 325 mg by mouth daily with breakfast.      . fish oil-omega-3 fatty acids 1000 MG capsule Take 2 g by mouth daily.        . furosemide (LASIX) 20 MG tablet Take 1 tablet (20 mg total) by mouth 2 (two) times daily.  30 tablet   11  . Glucosamine HCl 1000 MG TABS Take 1 tablet by mouth daily.        . Multiple Vitamin (MULTIVITAMIN PO) Take 1 tablet by mouth daily.        . pantoprazole (PROTONIX) 40 MG tablet Take 1 tablet (40 mg total) by mouth 2 (two) times daily.  60 tablet  1  . Phosphatidylserine-DHA-EPA (VAYACOG) 100-19.5-6.5 MG CAPS Take 1 tablet by mouth daily.  30 capsule  11  . Psyllium (METAMUCIL) 30.9 % POWD 1 tsp daily        No facility-administered medications prior to visit.    PAST MEDICAL HISTORY: Past Medical History  Diagnosis Date  . Paralytic strabismus, third or oculomotor nerve palsy, partial   . Allergic rhinitis due to other allergen   . Mild hypertension   . Mitral valve prolapse   . Peripheral vascular disease   . Hypercholesteremia   . Goiter, unspecified   . Diverticulosis of colon   . Hemorrhoids   . Cystitis, unspecified   . Osteoarthritis   . Osteoporosis   . CVA (cerebral infarction)   . Cerebral atrophy   . Anxiety   . Memory impairment   . Mallory-Weiss tear with bleeding 07/29/2012    PAST SURGICAL HISTORY: Past Surgical History  Procedure Laterality Date  . Breast lumpectomy  1960's    benign breast biopsy  . Cholecystectomy  1980    at [facility]  Marland Kitchen Appendectomy  1980    at [facility].  . Abdominal hysterectomy  07/1980    hyst and rectocele repair by Dr. Dewaine Conger  . Oculoplastic eye surgery  12/1997    in [place]  . Vv stripping years ago    . Esophagogastroduodenoscopy N/A 07/29/2012    Procedure: ESOPHAGOGASTRODUODENOSCOPY (EGD);  Surgeon: Iva Boop, MD;  Location: [facility];  Service: Endoscopy;  Laterality: N/A;    FAMILY HISTORY: History reviewed. No pertinent family history.  SOCIAL HISTORY: History   Social History  . Marital Status: Widowed    Spouse Name: N/A    Number of Children: 3  . Years of Education: N/A   Occupational History  . 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 Smoking  status: Never Smoker   . Smokeless tobacco: Never Used  . Alcohol Use: No  . Drug Use: No  . Sexually Active: No   Other Topics Concern  . Not on file   Social History Narrative   Widow, lives alone           PHYSICAL EXAM  Filed Vitals:   10/31/12 1334  BP: 140/73  Pulse: 85  Temp: 98.6 F (37 C)  TempSrc: Oral  Height: 5\' 2"  (1.575 m)  Weight: 99 lb (44.906 kg)   Body mass index is 18.1 kg/(m^2).  Generalized: In no acute distress, pleasant elderly Caucasian female.   Neck: Supple, no carotid bruits   Cardiac: Regular rate rhythm, no murmur   Pulmonary: Clear to auscultation bilaterally   Musculoskeletal: No deformity   Neurological examination   Mentation: Alert. Language fluent. MMSE 21/30 with DEFICITS IN ATTENTION AND CALCULATION (0/5 for Serial 7's and 2/5 Spelling "world" backwards), RECALL 2/3, AND DRAWING. CLOCK DRAWING 2/5, AFT 5 (normal 12+). Geriatric Depression Score 0.  Patient states her age as "35."  Not clear on what medications she is taking. MMSE 26/30, AFT 6 on 03/15/12,  25/30, AFT 17 on 01/18/12.  Cranial nerve II-XII: Pupils were equal round reactive to light extraocular movements were full, visual field were full on confrontational test. facial sensation and strength were normal. hearing was intact to finger rubbing bilaterally. Uvula tongue midline. head turning and shoulder shrug and were normal and symmetric.Tongue protrusion into cheek strength was normal. MOTOR: normal bulk and tone, full strength in the BUE, BLE, fine finger movements normal, no pronator drift SENSORY: normal and symmetric to light touch, pinprick, temperature, vibration and proprioception COORDINATION: finger-nose-finger, heel-to-shin bilaterally, there was no truncal ataxia REFLEXES: Brachioradialis 2/2, biceps 2/2, triceps 2/2, patellar 2/2, Achilles 2/2, plantar responses were flexor bilaterally. GAIT/STATION: Rising up from seated position without assistance,  normal stance, without trunk ataxia, moderate stride, good arm swing, smooth turning, able to perform tiptoe, and heel walking without difficulty.    DIAGNOSTIC DATA (LABS, IMAGING, TESTING) - I reviewed patient records, labs, notes, testing and imaging myself where available.  Lab Results  Component Value Date   WBC 8.2 08/09/2012   HGB 12.4 08/09/2012   HCT 36.8 08/09/2012   MCV 94.6 08/09/2012   PLT 364.0 08/09/2012      Component Value Date/Time   NA 135 08/09/2012 1147   K 4.1 08/09/2012 1147   CL 99 08/09/2012 1147   CO2 27 08/09/2012 1147   GLUCOSE 97 08/09/2012 1147   BUN 12 08/09/2012 1147   CREATININE 1.0 08/09/2012 1147   CALCIUM 9.4 08/09/2012 1147   PROT 5.8* 07/28/2012 1328   ALBUMIN 3.0* 07/28/2012 1328   AST 17 07/28/2012 1328   ALT 16 07/28/2012 1328   ALKPHOS 76 07/28/2012 1328   BILITOT 0.4 07/28/2012 1328   GFRNONAA 79* 07/31/2012 0425   GFRAA >90 07/31/2012 0425   Lab Results  Component Value Date   CHOL 213* 09/11/2012   HDL 76.50 09/11/2012   LDLCALC 111* 09/12/2011   LDLDIRECT 123.4 09/11/2012   TRIG 77.0 09/11/2012   CHOLHDL 3 09/11/2012    Lab Results  Component Value Date   VITAMINB12 1185* 07/28/2012   Lab Results  Component Value Date   TSH 1.29 09/12/2011    MRI of the brain with and without contrast 02/08/2012: Mild changes of chronic microvascular ischemia and generalized cerebral atrophy. Overall no significant changes compared with MRI scan dated 12/01/2011.  ASSESSMENT AND PLAN Ms. Rivka Baune is an 79 year old right-handed Caucasian female here with history of right occipital infarct and long-standing mild memory difficulties that are progressive; likely Alzheimer's dementia.  Also with complaints of sinus pressure.  Start Aricept 5 mg daily and titrate to 10 mg at next visit if tolerated well. Patient advised to stay active and do mentally challenging activities like crossword puzzles or suduku. Recommended saline nasal spray every morning and  drinking plenty of fluids to relieve sinus pressure.  May also try cool mist humidifier in the house.  Attempt  was made to call son, Reynalda Canny to discuss diagnosis and plan of care, no answer, left message to call office.  I have concerns about patient living alone, driving, and taking her medications as directed. Both sons live in [place], [place]. Followup in 2 months.  Maxen Rowland NP-C 10/31/2012, 5:42 PM  [facility] [address] [place], [place] [address] [phone]  I have personally examined this patient, reviewed pertinent data, developed plan of care and discussed with patient and agree with above.  Delia Heady, MD

## 2012-10-31 NOTE — Patient Instructions (Addendum)
Prescription for Aricept 5 mg sent to [facility] on [facility].  Take 1 tablet each night.  Return for follow up in 2 months.

## 2012-10-31 NOTE — Telephone Encounter (Signed)
Left msg to please call office to discuss mother's visit today.

## 2012-11-05 ENCOUNTER — Telehealth: Payer: Self-pay | Admitting: Nurse Practitioner

## 2012-11-07 ENCOUNTER — Telehealth: Payer: Self-pay | Admitting: Nurse Practitioner

## 2012-11-07 NOTE — Telephone Encounter (Signed)
Contact made with Ms. Lada son Freida Busman, concerning mother's last visit and dx of mild dementia, son aware and plans to come to next visit in September.  I expressed concern over patient's cognitive decline and her safety in living alone.  Mr. Kory lives in [place], and hopes to move his mother there with him.  He plans on coming to next appointment with his mother in September. -LL

## 2012-11-07 NOTE — Telephone Encounter (Signed)
No answer Left message, re: mother's office visit last week.  Requested call back.-LL

## 2012-11-09 ENCOUNTER — Telehealth: Payer: Self-pay | Admitting: Nurse Practitioner

## 2012-11-12 NOTE — Telephone Encounter (Signed)
Updated Mr. Mullings on his mother's last visit and discussed my recommendation that she have closer family supervision due to her memory and cognitive decline.  He was receptive to the idea and discussed plans the family was working on.

## 2012-12-05 ENCOUNTER — Ambulatory Visit: Payer: Self-pay | Admitting: Neurology

## 2012-12-11 ENCOUNTER — Ambulatory Visit: Payer: Medicare Other | Admitting: Pulmonary Disease

## 2012-12-24 ENCOUNTER — Encounter: Payer: Self-pay | Admitting: Nurse Practitioner

## 2012-12-24 ENCOUNTER — Ambulatory Visit (INDEPENDENT_AMBULATORY_CARE_PROVIDER_SITE_OTHER): Payer: Medicare Other | Admitting: Nurse Practitioner

## 2012-12-24 VITALS — BP 159/71 | HR 70 | Temp 98.0°F | Ht 62.0 in | Wt 96.0 lb

## 2012-12-24 DIAGNOSIS — F0390 Unspecified dementia without behavioral disturbance: Secondary | ICD-10-CM

## 2012-12-24 DIAGNOSIS — F03A0 Unspecified dementia, mild, without behavioral disturbance, psychotic disturbance, mood disturbance, and anxiety: Secondary | ICD-10-CM

## 2012-12-24 MED ORDER — DONEPEZIL HCL 10 MG PO TABS: 10.0000 mg | ORAL_TABLET | Freq: Every day | ORAL | Status: DC

## 2012-12-24 NOTE — Patient Instructions (Addendum)
Increase Aricept to 10 mg daily. New prescription sent.  Patient advised to stay active and do mentally challenging activities like crossword puzzles or suduku.   Recommended saline nasal spray every morning and drinking plenty of fluids to relieve sinus pressure. May also try cool mist humidifier in the house.   Refer to the Alzheimer's Association Website for resources and information, especially for referral on doctors in [place].  Www.alzheimersassociation.org  Also [employer].com has lots of info for caregivers.  Follow up here as needed.  Websites for learning activities: [employer].com

## 2012-12-24 NOTE — Progress Notes (Signed)
[facility]  PATIENT: [name] DOB: [birth year]   REASON FOR VISIT: follow up HISTORY FROM: patient  HISTORY OF PRESENT ILLNESS: 79 year old right handed Caucasian female with long-standing mild memory difficulties likely due to 8 collated mild cognitive impairment. Remote history of right occipital infarct.  03/15/12 (PS): She returns for followup after her last visit with me on 01/18/2012. She states that she takes Plavix though I do not find listed on the medication list. She feels Plavix is causing her to have memory loss. I spent a lot of time explaining why this was not correct. She remains quite active and is independent in activities of daily living. She does to the gym 4 times a week and take special regularly now. She had blood work done on 01/18/2012 in which he showed normal TSH, homocystine, vitamin B12 and RPR was negative. An EEG dated 01/30/2012 showed no elective form activity and was normal. MRI scan of the brain 02/08/2012 showed mild changes of chronic microvascular ischemia and generalized cerebral atrophy. No significant changes compared with prior scan from 12/01/2011. She has noted new complaints today.   10/31/12 (LL): Patient returns to office for follow up of memory loss. States she is having confusion at times and Dr. Kriste Basque has prescribed a medication to help her memory that starts with a "V". She complains that she has "this feeling" around her eyes which she has trouble putting into words. She states that it is not pain, but replies "yes" to a description of pressure. She states that it is worst in the mornings. She is unclear about what medications she is taking. She did not drive to the office today (called a cab) but states that she does drive sometimes. She reports that she still goes to the gym 4 days a week and stays active. She reports drinking pure beet juice that she gets at [employer] to help with her memory. She states that she  is independent with all of her ADL's but lives alone and her sons live in [place].   UPDATE 12/24/12 (LL):  Patient was started on Aricept 5 mg at last visit.  She reports that she has had no side effects from the medication and she has improved on the MMSE from 21/30 last visit to 26/30 today.  She does report losing some weight. (3 lbs.)  She is accompanied by her son from [place], [place] who she has been staying with.  He hopes to have her move there permanently with him.  She has no new complaints.  REVIEW OF SYSTEMS: Full 14 system review of systems performed and notable only for:  constitutional: weight loss  cardiovascular: N/A respiratory: N/A  endocrine: N/A  ear/nose/throat: N/A  Eyes; N/A  Hematology/Lymph: N/A musculoskeletal: N/A  skin: N/A  genitourinary: N/A  Gastrointestinal: N/A  allergy/immunology: N/A  neurological: anxiety  sleep: N/A  psychiatric: N/A  ALLERGIES: Allergies  Allergen Reactions  . Augmentin [Amoxicillin-Pot Clavulanate]   . Septra [Sulfamethoxazole-Tmp Ds]   . Shellfish Allergy   . Sulfamethoxazole W-Trimethoprim Other (See Comments)    allergy to Sulfa w/ flu-like illness.  . Amoxicillin-Pot Clavulanate Other (See Comments)     "fuzzy-headed"    HOME MEDICATIONS: Outpatient Prescriptions Prior to Visit  Medication Sig Dispense Refill  . ALPRAZolam (XANAX) 0.25 MG tablet Take 1/2 to 1 tablet by mouth two times daily as needed for nervesTake 1/2 to 1 tablet by mouth two times daily as needed for nerves  60  tablet  1  . amLODipine (NORVASC) 5 MG tablet Take 1 tablet (5 mg total) by mouth daily.  30 tablet  11  . Psyllium (METAMUCIL) 30.9 % POWD 1 tsp daily       . donepezil (ARICEPT) 5 MG tablet Take 1 tablet (5 mg total) by mouth at bedtime.  30 tablet  3  . aspirin 81 MG tablet Take 81 mg by mouth daily.      Marland Kitchen Bioflavonoid Products (ESTER C PO) Take 1 tablet by mouth daily.        . calcium gluconate 500 MG tablet Take 500 mg by mouth daily.        . Cholecalciferol (VITAMIN D) 1000 UNITS capsule Take 1,000 Units by mouth daily.        . clopidogrel (PLAVIX) 75 MG tablet Take one tablet every other day.  (Alternating Aspirin 81 mg every other day)  15 tablet  5  . ferrous sulfate 325 (65 FE) MG tablet Take 325 mg by mouth daily with breakfast.      . fish oil-omega-3 fatty acids 1000 MG capsule Take 2 g by mouth daily.        . furosemide (LASIX) 20 MG tablet Take 1 tablet (20 mg total) by mouth 2 (two) times daily.  30 tablet  11  . Glucosamine HCl 1000 MG TABS Take 1 tablet by mouth daily.        . Multiple Vitamin (MULTIVITAMIN PO) Take 1 tablet by mouth daily.        . pantoprazole (PROTONIX) 40 MG tablet Take 1 tablet (40 mg total) by mouth 2 (two) times daily.  60 tablet  1  . Phosphatidylserine-DHA-EPA (VAYACOG) 100-19.5-6.5 MG CAPS Take 1 tablet by mouth daily.  30 capsule  11   No facility-administered medications prior to visit.    PAST MEDICAL HISTORY: Past Medical History  Diagnosis Date  . Paralytic strabismus, third or oculomotor nerve palsy, partial   . Allergic rhinitis due to other allergen   . Mild hypertension   . Mitral valve prolapse   . Peripheral vascular disease   . Hypercholesteremia   . Goiter, unspecified   . Diverticulosis of colon   . Hemorrhoids   . Cystitis, unspecified   . Osteoarthritis   . Osteoporosis   . CVA (cerebral infarction)   . Cerebral atrophy   . Anxiety   . Memory impairment   . Mallory-Weiss tear with bleeding 07/29/2012    PAST SURGICAL HISTORY: Past Surgical History  Procedure Laterality Date  . Breast lumpectomy  1960's    benign breast biopsy  . Cholecystectomy  1980    at [facility]  Marland Kitchen Appendectomy  1980    at [facility].  . Abdominal hysterectomy  07/1980    hyst and rectocele repair by Dr. Dewaine Conger  . Oculoplastic eye surgery  12/1997    in [place]  . Vv stripping years ago    . Esophagogastroduodenoscopy N/A 07/29/2012    Procedure: ESOPHAGOGASTRODUODENOSCOPY  (EGD);  Surgeon: Iva Boop, MD;  Location: [facility];  Service: Endoscopy;  Laterality: N/A;    FAMILY HISTORY: History reviewed. No pertinent family history.  SOCIAL HISTORY: History   Social History  . Marital Status: Widowed    Spouse Name: N/A    Number of Children: 3  . Years of Education: N/A   Occupational History  . 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  Smoking status: Never Smoker   . Smokeless tobacco: Never Used  . Alcohol Use: No  . Drug Use: No  . Sexual Activity: No   Other Topics Concern  . Not on file   Social History Narrative   Widow, lives alone           PHYSICAL EXAM  Filed Vitals:   12/24/12 1257  BP: 159/71  Pulse: 70  Temp: 98 F (36.7 C)  TempSrc: Oral  Height: 5\' 2"  (1.575 m)  Weight: 96 lb (43.545 kg)   Body mass index is 17.55 kg/(m^2).  Generalized: In no acute distress, pleasant elderly Caucasian female.  Neck: Supple, no carotid bruits  Cardiac: Regular rate rhythm, no murmur  Pulmonary: Clear to auscultation bilaterally  Musculoskeletal: No deformity   Neurological examination  Mentation: Alert. Language fluent. MMSE 26/30 with deficits in month, spelling backward, recall 2/3, AFT 13, Clock drawing 1/4, GDS 0.  Not clear on what medications she is taking.  (Last visit: MMSE 21/30 with DEFICITS IN ATTENTION AND CALCULATION (0/5 for Serial 7's and 2/5 Spelling "world" backwards), RECALL 2/3, AND DRAWING. CLOCK DRAWING 1/4, AFT 5 (normal 12+). Geriatric Depression Score 0. Patient states her age as "52."   MMSE 26/30, AFT 6 on 03/15/12, 25/30, AFT 17 on 01/18/12. ) Cranial nerve II-XII: Pupils were equal round reactive to light extraocular movements were full, visual field were full on confrontational test. facial sensation and strength were normal. hearing was intact to finger rubbing bilaterally. Uvula tongue midline. head turning and shoulder shrug and were normal and  symmetric.Tongue protrusion into cheek strength was normal.  MOTOR: normal bulk and tone, full strength in the BUE, BLE, fine finger movements normal, no pronator drift  SENSORY: normal and symmetric to light touch, pinprick, temperature, vibration and proprioception  COORDINATION: finger-nose-finger, heel-to-shin bilaterally, there was no truncal ataxia  REFLEXES: Brachioradialis 2/2, biceps 2/2, triceps 2/2, patellar 2/2, Achilles 2/2, plantar responses were flexor bilaterally.  GAIT/STATION: Rising up from seated position without assistance, normal stance, without trunk ataxia, moderate stride, good arm swing, smooth turning, able to perform tiptoe, and heel walking without difficulty.   DIAGNOSTIC DATA (LABS, IMAGING, TESTING) - I reviewed patient records, labs, notes, testing and imaging myself where available.  Lab Results  Component Value Date   WBC 8.2 08/09/2012   HGB 12.4 08/09/2012   HCT 36.8 08/09/2012   MCV 94.6 08/09/2012   PLT 364.0 08/09/2012      Component Value Date/Time   NA 135 08/09/2012 1147   K 4.1 08/09/2012 1147   CL 99 08/09/2012 1147   CO2 27 08/09/2012 1147   GLUCOSE 97 08/09/2012 1147   BUN 12 08/09/2012 1147   CREATININE 1.0 08/09/2012 1147   CALCIUM 9.4 08/09/2012 1147   PROT 5.8* 07/28/2012 1328   ALBUMIN 3.0* 07/28/2012 1328   AST 17 07/28/2012 1328   ALT 16 07/28/2012 1328   ALKPHOS 76 07/28/2012 1328   BILITOT 0.4 07/28/2012 1328   GFRNONAA 79* 07/31/2012 0425   GFRAA >90 07/31/2012 0425   Lab Results  Component Value Date   CHOL 213* 09/11/2012   HDL 76.50 09/11/2012   LDLCALC 111* 09/12/2011   LDLDIRECT 123.4 09/11/2012   TRIG 77.0 09/11/2012   CHOLHDL 3 09/11/2012   Lab Results  Component Value Date   HGBA1C  Value: 5.5 (NOTE) The ADA recommends the following therapeutic goal for glycemic control related to Hgb A1c measurement: Goal of therapy: <6.5 Hgb A1c  Reference: American Diabetes  Association: Clinical Practice Recommendations 2010, Diabetes Care, 2010, 33:  (Suppl  1). 12/02/2008   Lab Results  Component Value Date   VITAMINB12 1185* 07/28/2012   Lab Results  Component Value Date   TSH 1.29 09/12/2011    MRI of the brain with and without contrast 02/08/2012:  Mild changes of chronic microvascular ischemia and generalized cerebral atrophy. Overall no significant changes compared with MRI scan dated 12/01/2011.   ASSESSMENT AND PLAN Ms. [name] is an 79 year old right-handed Caucasian female here with history of right occipital infarct and long-standing mild memory difficulties that are progressive; likely Alzheimer's dementia.   PLAN: Increase Aricept to 10 mg daily. New prescription given. Recommended adding calories to the diet to try to gain weight by adding protein shakes or bars between meals. Patient advised to stay active and do mentally challenging activities like crossword puzzles or suduku.  Refer to the Alzheimer's Association Website for resources and information, especially for referral for doctor in [place]. [employer] Also [employer].com has lots of info for caregivers. Follow up here as needed.   Meds ordered this encounter  Medications  . donepezil (ARICEPT) 10 MG tablet    Sig: Take 1 tablet (10 mg total) by mouth at bedtime.    Dispense:  90 tablet    Refill:  3    Order Specific Question:  Supervising Provider    Answer:  Patterson Hammersmith Jaysha Lasure, MSN, NP-C 12/24/2012, 2:24 PM [facility] [address] [place], [place] [address] [phone]

## 2012-12-31 ENCOUNTER — Ambulatory Visit: Payer: Medicare Other | Admitting: Neurology

## 2012-12-31 ENCOUNTER — Telehealth: Payer: Self-pay | Admitting: Pulmonary Disease

## 2012-12-31 ENCOUNTER — Ambulatory Visit: Payer: Medicare Other | Admitting: Nurse Practitioner

## 2012-12-31 MED ORDER — AMLODIPINE BESYLATE 5 MG PO TABS: 5.0000 mg | ORAL_TABLET | Freq: Every day | ORAL | Status: DC

## 2012-12-31 NOTE — Telephone Encounter (Signed)
#   [facility] was not the pharmacy. I called # we have listed and spoke with Sumih the pharmacists and advised her per last OV she was on 5 mg daily. She will cancel all rx's on file and asked if we could send in new RX. Will do so. Nothing further needed

## 2013-01-18 ENCOUNTER — Telehealth: Payer: Self-pay | Admitting: Pulmonary Disease

## 2013-01-18 NOTE — Telephone Encounter (Signed)
Called the pharmacy to make sure that they got the rx for the amlodipine on 10/6 that was sent in electronically.  The pharmacist stated this was filled and sold on 10/6.  lmomtcb for the pt to make her aware.  Pharmacy stated that they can sell her 10 pills to last until the insurance will pay for the rx again if she is out but this will be $10 for her.

## 2013-01-18 NOTE — Telephone Encounter (Signed)
Called and spoke with pt and she stated that her BP was 159/75 this morning and her pulse was 83.  Pt stated that she has been out of her norvasc for the last couple of days.  i advised the pt that we had sent a new rx to her pharmacy and that she will need to have this picked up from the pharmacy.  Pt is aware and nothing further is needed.   Pt is aware to cont to monitor her BP at home and call back for any concerns.

## 2013-01-18 NOTE — Telephone Encounter (Signed)
Pt called back and she is aware that SN wants her on the norvasc 5 mg and to make sure she takes this everyday.  Pt voiced her understanding and nothing further is needed.

## 2013-01-22 ENCOUNTER — Other Ambulatory Visit: Payer: Self-pay | Admitting: Pulmonary Disease

## 2013-01-23 ENCOUNTER — Telehealth: Payer: Self-pay | Admitting: Pulmonary Disease

## 2013-01-23 DIAGNOSIS — D0460 Carcinoma in situ of skin of unspecified upper limb, including shoulder: Secondary | ICD-10-CM | POA: Diagnosis not present

## 2013-01-23 DIAGNOSIS — D485 Neoplasm of uncertain behavior of skin: Secondary | ICD-10-CM | POA: Diagnosis not present

## 2013-01-23 DIAGNOSIS — L570 Actinic keratosis: Secondary | ICD-10-CM | POA: Diagnosis not present

## 2013-01-23 MED ORDER — ALPRAZOLAM 0.25 MG PO TABS: ORAL_TABLET | ORAL | Status: DC

## 2013-01-23 NOTE — Telephone Encounter (Signed)
I spoke with pt and is aware RX will be called in. I called [facility] and gave VO. Nothing further needed

## 2013-01-31 ENCOUNTER — Telehealth: Payer: Self-pay | Admitting: Neurology

## 2013-01-31 NOTE — Telephone Encounter (Signed)
This patient has moderate dementia.  Please have patient schedule appointment ONLY when she can come in with her son.  For either me or Dr. Demetrius Charity.

## 2013-01-31 NOTE — Telephone Encounter (Signed)
Spoke with patient and she wants to speak with Ms Shayne Alken, is having more difficulties forcusing, concentration, more pain in head, wants to know what's going on, please call back today

## 2013-02-01 NOTE — Telephone Encounter (Signed)
sched patient w/ Dr Pearlean Brownie, said that her son cannot come, [place] in [place],confirmed appt w/ patient

## 2013-02-20 ENCOUNTER — Telehealth: Payer: Self-pay | Admitting: Pulmonary Disease

## 2013-02-20 DIAGNOSIS — K921 Melena: Secondary | ICD-10-CM

## 2013-02-20 MED ORDER — HYDROCORTISONE 2.5 % RE CREA: TOPICAL_CREAM | RECTAL | Status: DC

## 2013-02-20 NOTE — Telephone Encounter (Signed)
Per SN---  anusol hc cream apply after each BM And we will place an order for pt to follow up with Dr. Leone Payor ASAP for further eval.   Called and spoke with pt and she is aware of SN recs and is aware that we will set this appt up for her.

## 2013-02-20 NOTE — Telephone Encounter (Signed)
Called and spoke with pt and she stated that the last 2 days she has been having some blood in her stools.  She stated that her stomach has not been feeling well and she feels that something is going on.  She stated that her abd is uncomfortable and that is worse today than yesterday.  Pt stated that the blood is not bright red but it is not very dark either.  Stools have been normal per the pt.  Pt is concerned and would like recs from SN.  Please advise. Thanks  Allergies  Allergen Reactions  . Augmentin [Amoxicillin-Pot Clavulanate]   . Septra [Sulfamethoxazole-Tmp Ds]   . Shellfish Allergy   . Sulfamethoxazole-Trimethoprim Other (See Comments)    allergy to Sulfa w/ flu-like illness.  . Amoxicillin-Pot Clavulanate Other (See Comments)     "fuzzy-headed"     Current Outpatient Prescriptions on File Prior to Visit  Medication Sig Dispense Refill  . ALPRAZolam (XANAX) 0.25 MG tablet Take 1/2 to 1 tablet by mouth two times daily as needed for nervesTake 1/2 to 1 tablet by mouth two times daily as needed for nerves  60 tablet  1  . amLODipine (NORVASC) 5 MG tablet Take 1 tablet (5 mg total) by mouth daily.  30 tablet  6  . aspirin 81 MG tablet Take 81 mg by mouth daily.      Marland Kitchen Bioflavonoid Products (ESTER C PO) Take 1 tablet by mouth daily.        . calcium gluconate 500 MG tablet Take 500 mg by mouth daily.       . Cholecalciferol (VITAMIN D) 1000 UNITS capsule Take 1,000 Units by mouth daily.        . clopidogrel (PLAVIX) 75 MG tablet Take one tablet every other day.  (Alternating Aspirin 81 mg every other day)  15 tablet  5  . donepezil (ARICEPT) 10 MG tablet Take 1 tablet (10 mg total) by mouth at bedtime.  90 tablet  3  . ferrous sulfate 325 (65 FE) MG tablet Take 325 mg by mouth daily with breakfast.      . fish oil-omega-3 fatty acids 1000 MG capsule Take 2 g by mouth daily.        . furosemide (LASIX) 20 MG tablet Take 1 tablet (20 mg total) by mouth 2 (two) times daily.  30 tablet   11  . Glucosamine HCl 1000 MG TABS Take 1 tablet by mouth daily.        . Multiple Vitamin (MULTIVITAMIN PO) Take 1 tablet by mouth daily.        . pantoprazole (PROTONIX) 40 MG tablet Take 1 tablet (40 mg total) by mouth 2 (two) times daily.  60 tablet  1  . Phosphatidylserine-DHA-EPA (VAYACOG) 100-19.5-6.5 MG CAPS Take 1 tablet by mouth daily.  30 capsule  11  . Psyllium (METAMUCIL) 30.9 % POWD 1 tsp daily        No current facility-administered medications on file prior to visit.

## 2013-02-22 ENCOUNTER — Telehealth: Payer: Self-pay | Admitting: Pulmonary Disease

## 2013-02-22 NOTE — Telephone Encounter (Signed)
Pt calling to follow up on appt with Dr Leone Payor office. Referral made on 02/20/13 for ASAP appt-- office is currently closed-- unable to make appt at this time.  Pt is aware that their office was closed for the Holidays when making the referral--will try to call them again today. Pt requesting a call back today with either an appt or recs on what to do in the meantime.  Spoke with Bjorn Loser, GI office is closed today as well. Bjorn Loser is going to speak with Leigh for recs.  Will send to Leigh to follow up on.

## 2013-02-22 NOTE — Telephone Encounter (Signed)
Called and spoke with pt and she is aware that GI is closed until Monday.  She is aware that we will call her on Monday once the appt has been set up with GI.  i advised the pt that if she see's an increase in the blood in her stool, or if her pain increases at all that she will need to be seen in the ER for eval before Monday.  Pt stated that her pain is no worse today, and she only is seeing the blood when she has a BM and this has not been worse over the last couple of days.  Pt voiced her understanding that she will need to be seen in the ER if her symptoms were to get worse.  Will hold message until Monday morning.

## 2013-02-25 NOTE — Telephone Encounter (Signed)
appt has been scheduled for the pt to see Dr. Jarold Motto on 12-2.

## 2013-02-26 ENCOUNTER — Ambulatory Visit (INDEPENDENT_AMBULATORY_CARE_PROVIDER_SITE_OTHER): Payer: Medicare Other | Admitting: Physician Assistant

## 2013-02-26 ENCOUNTER — Other Ambulatory Visit (INDEPENDENT_AMBULATORY_CARE_PROVIDER_SITE_OTHER): Payer: Medicare Other

## 2013-02-26 ENCOUNTER — Encounter: Payer: Self-pay | Admitting: Physician Assistant

## 2013-02-26 VITALS — BP 146/62 | HR 90 | Ht 63.0 in | Wt 94.0 lb

## 2013-02-26 DIAGNOSIS — R109 Unspecified abdominal pain: Secondary | ICD-10-CM | POA: Diagnosis not present

## 2013-02-26 DIAGNOSIS — K921 Melena: Secondary | ICD-10-CM

## 2013-02-26 LAB — CBC WITH DIFFERENTIAL/PLATELET
Lymphocytes Relative: 8.9 % — ABNORMAL LOW (ref 12.0–46.0)
Lymphs Abs: 0.9 10*3/uL (ref 0.7–4.0)
MCHC: 33.5 g/dL (ref 30.0–36.0)
Monocytes Absolute: 0.6 10*3/uL (ref 0.1–1.0)
Platelets: 298 10*3/uL (ref 150.0–400.0)
RBC: 4.43 Mil/uL (ref 3.87–5.11)
RDW: 13 % (ref 11.5–14.6)

## 2013-02-26 LAB — BASIC METABOLIC PANEL
Creatinine, Ser: 1 mg/dL (ref 0.4–1.2)
Glucose, Bld: 103 mg/dL — ABNORMAL HIGH (ref 70–99)

## 2013-02-26 MED ORDER — ALPRAZOLAM 0.25 MG PO TABS: ORAL_TABLET | ORAL | Status: DC

## 2013-02-26 NOTE — Progress Notes (Addendum)
Subjective:    Patient ID: [name], female    DOB: [birth year], 79 y.o.   MRN: [id number]  HPI  [name] is a nice 79 year old white female 2 was seen by Dr. Leone Payor during a hospitalization in May of 2014 at which time she had presented with an acute upper GI bleed and was found to have a Mallory-Weiss tear on endoscopy. She was not bleeding at the time of the procedure and did not require any endoscopic therapy. She has history of a dementia, hypertension, mitral valve prolapse, peripheral vascular disease with history of CVA and severe diverticular disease. Her old records relate that she had colonoscopy in 1998 with finding of severe diverticulosis however I cannot locate a report in any of our systems today. Pt  comes in today with complaints of blood in her stool. She is a vague historian but says she has noticed small amounts of what she describes as bright red blood mixed in with her bowel movements intermittently over the past week or 2. She says she saw just "a touch" this morning She has not noted any blood on the tissue and no melena. Her bowel movements have been normal and she denies any rectal pain or discomfort. She does have some lower abdominal discomfort but is unable to be specific about duration etc. She says this has not been severe ,and  would not have been enough for her to come to a doctor with.. No fever or chills appetite has been fair and weight has been stable. Patient's main concern today as anxiety. She tells me a couple of times that she needs something for her nerves though she has Xanax listed on her med sheet. She has brought the bottle with her which is empty and did not seem to know that she had a refill available. Also on questioning about her other meds it appears that she's not taking several of her other prescribed meds including Plavix.    Review of Systems  Constitutional: Negative.   HENT: Negative.   Eyes: Negative.   Respiratory: Negative.    Cardiovascular: Negative.   Gastrointestinal: Positive for abdominal pain and blood in stool.  Endocrine: Negative.   Genitourinary: Negative.   Musculoskeletal: Negative.   Skin: Negative.   Allergic/Immunologic: Negative.   Neurological: Negative.   Psychiatric/Behavioral: The patient is nervous/anxious.    Outpatient Prescriptions Prior to Visit  Medication Sig Dispense Refill  . amLODipine (NORVASC) 5 MG tablet Take 1 tablet (5 mg total) by mouth daily.  30 tablet  6  . Multiple Vitamin (MULTIVITAMIN PO) Take 1 tablet by mouth daily.        Marland Kitchen ALPRAZolam (XANAX) 0.25 MG tablet Take 1/2 to 1 tablet by mouth two times daily as needed for nervesTake 1/2 to 1 tablet by mouth two times daily as needed for nerves  60 tablet  1  . aspirin 81 MG tablet Take 81 mg by mouth daily.      Marland Kitchen Bioflavonoid Products (ESTER C PO) Take 1 tablet by mouth daily.        . calcium gluconate 500 MG tablet Take 500 mg by mouth daily.       . Cholecalciferol (VITAMIN D) 1000 UNITS capsule Take 1,000 Units by mouth daily.        . clopidogrel (PLAVIX) 75 MG tablet Take one tablet every other day.  (Alternating Aspirin 81 mg every other day)  15 tablet  5  . donepezil (ARICEPT) 10 MG tablet Take  1 tablet (10 mg total) by mouth at bedtime.  90 tablet  3  . ferrous sulfate 325 (65 FE) MG tablet Take 325 mg by mouth daily with breakfast.      . fish oil-omega-3 fatty acids 1000 MG capsule Take 2 g by mouth daily.        . furosemide (LASIX) 20 MG tablet Take 1 tablet (20 mg total) by mouth 2 (two) times daily.  30 tablet  11  . Glucosamine HCl 1000 MG TABS Take 1 tablet by mouth daily.        . hydrocortisone (ANUSOL-HC) 2.5 % rectal cream Apply after each BM  30 g  2  . pantoprazole (PROTONIX) 40 MG tablet Take 1 tablet (40 mg total) by mouth 2 (two) times daily.  60 tablet  1  . Phosphatidylserine-DHA-EPA (VAYACOG) 100-19.5-6.5 MG CAPS Take 1 tablet by mouth daily.  30 capsule  11  . Psyllium (METAMUCIL) 30.9 %  POWD 1 tsp daily        No facility-administered medications prior to visit.   Allergies  Allergen Reactions  . Augmentin [Amoxicillin-Pot Clavulanate]   . Septra [Sulfamethoxazole-Tmp Ds]   . Shellfish Allergy   . Sulfamethoxazole-Trimethoprim Other (See Comments)    allergy to Sulfa w/ flu-like illness.  . Amoxicillin-Pot Clavulanate Other (See Comments)     "fuzzy-headed"   Patient Active Problem List   Diagnosis Date Noted  . Mild dementia 10/31/2012  . Mallory-Weiss tear with bleeding 07/29/2012  . Acute blood loss anemia 07/28/2012  . Cerumen impaction 12/15/2011  . Memory impairment 12/02/2011  . NECK PAIN 05/18/2010  . HYPERCHOLESTEROLEMIA, BORDERLINE 10/06/2009  . ANXIETY 03/30/2009  . PARALYTIC STRAB THIRD/OCULOMOTR NERVE PALSY PART 12/30/2008  . HYPERTENSION, MILD 12/30/2008  . CVA 12/02/2008  . BLURRED VISION 11/28/2008  . UNSPECIFIED CYSTITIS 03/25/2008  . GOITER, UNSPECIFIED 02/27/2007  . CEREBRAL ATROPHY 02/27/2007  . MITRAL VALVE PROLAPSE 02/27/2007  . PERIPHERAL VASCULAR DISEASE 02/27/2007  . HEMORRHOIDS 02/27/2007  . VENOUS INSUFFICIENCY 02/27/2007  . ALLERGIC RHINITIS DUE TO OTHER ALLERGEN 02/27/2007  . DIVERTICULOSIS OF COLON 02/27/2007  . OSTEOARTHRITIS 02/27/2007  . OSTEOPOROSIS 02/27/2007   History  Substance Use Topics  . Smoking status: Never Smoker   . Smokeless tobacco: Never Used  . Alcohol Use: No  Lives alone    Objective:   Physical Exam   well-developed small elderly white female in no acute distress, pleasant but anxious. Blood pressure 146/62 pulse 90 height 5 foot 3 weight 94. HEENT; nontraumatic normocephalic EOMI PERRLA sclera anicteric, Supple no JVD, Cardiovascular; regular rate and rhythm with S1-S2 no murmur or gallop, ;Pulm;; clear bilaterally, Abdomen ;soft minimally tender in bilateral lower quads,no guarding or rebound no palpable mass or hepatosplenomegaly she does have a cholecystectomy scar bowel sounds are present  there is no bruit rectal exam she does have an external hemorrhoid which is inflamed and slightly tender digital exam is negative stool is brown and Hemoccult negative, Extremities; no clubbing cyanosis or edema skin warm and dry, Psych; mood and affect appropriate though she is obviously anxious.        Assessment & Plan:  #16  79 year old female with new complaint of intermittent small-volume hematochezia x1-2 weeks. This may be hemorrhoidal in origin as she does have a friable external hemorrhoid, however stool is Hemoccult-negative today #2 Vague complaint of lower abdominal  pain #3 history of diverticulosis #4 Mallory-Weiss tear with acute upper GI bleed May 2014 #5 dementia #6 noncompliance with medications #7  chronic anxiety #8 peripheral vascular disease #9 history of CVA #10 hypertension  Plan; we'll check CBC with differential today Schedule for CT scan of the abdomen and pelvis Will not treat hemorrhoid for now as she is not having any discomfort Refill her Xanax 0.25 one half to one tablet twice daily as needed-this is generally been refiledl by Dr. Kriste Basque. Further plans pending results of CT Paper chart has been requested to review remote colonoscopy.   Addendum- paper chart reviewed- Colonoscopy 1998 Dr. Antony Haste diverticulosis sigmoid,large wide mouthed diverticuli- unable to pass scope beyond sigmoid.  BE  Done subsequent showed sigmoid diverticulosis, and one tic off terminal ileum.

## 2013-02-26 NOTE — Patient Instructions (Signed)
Please go to the basement level to have your labs drawn.   You have been scheduled for a CT scan of the abdomen and pelvis at [facility] ([address]---this is in the same building as Architectural [facility]).   You are scheduled on 02-27-2013 at 10:30 am . You should arrive at 10:15 am prior to your appointment time for registration. Please follow the written instructions below on the day of your exam:  WARNING: IF YOU ARE ALLERGIC TO IODINE/X-RAY DYE, PLEASE NOTIFY RADIOLOGY IMMEDIATELY AT [phone]! YOU WILL BE GIVEN A 13 HOUR PREMEDICATION PREP.  1) Do not eat or drink anything after 6:30 am (4 hours prior to your test) 2) You have been given 2 bottles of oral contrast to drink. The solution may taste better if refrigerated, but do NOT add ice or any other liquid to this solution. Shake well before drinking.    Drink 1 bottle of contrast @ 8:30 am  (2 hours prior to your exam)  Drink 1 bottle of contrast @ 9:30 am  (1 hour prior to your exam)   The purpose of you drinking the oral contrast is to aid in the visualization of your intestinal tract. The contrast solution may cause some diarrhea. Before your exam is started, you will be given a small amount of fluid to drink. Depending on your individual set of symptoms, you may also receive an intravenous injection of x-ray contrast/dye. Plan on being at [facility] for 30 minutes or long, depending on the type of exam you are having performed.  If you have any questions regarding your exam or if you need to reschedule, you may call the CT department at [phone] between the hours of 8:00 am and 5:00 pm, Monday-Friday.  ________________________________________________________________________

## 2013-02-27 ENCOUNTER — Other Ambulatory Visit: Payer: Medicare Other

## 2013-02-28 ENCOUNTER — Ambulatory Visit (INDEPENDENT_AMBULATORY_CARE_PROVIDER_SITE_OTHER)
Admission: RE | Admit: 2013-02-28 | Discharge: 2013-02-28 | Disposition: A | Discharge status: Home / Self Care | Payer: Medicare Other | Source: Ambulatory Visit | Attending: Physician Assistant | Admitting: Physician Assistant

## 2013-02-28 DIAGNOSIS — K921 Melena: Secondary | ICD-10-CM

## 2013-02-28 DIAGNOSIS — R109 Unspecified abdominal pain: Secondary | ICD-10-CM | POA: Diagnosis not present

## 2013-02-28 DIAGNOSIS — K5730 Diverticulosis of large intestine without perforation or abscess without bleeding: Secondary | ICD-10-CM | POA: Diagnosis not present

## 2013-02-28 DIAGNOSIS — N281 Cyst of kidney, acquired: Secondary | ICD-10-CM | POA: Diagnosis not present

## 2013-02-28 MED ORDER — IOHEXOL 300 MG/ML  SOLN
80.0000 mL | Freq: Once | INTRAMUSCULAR | Status: AC | PRN
  Administered 2013-02-28: 80 mL via INTRAVENOUS

## 2013-03-01 NOTE — Progress Notes (Signed)
Agree with Ms. Oswald Hillock assessment and plan.   CT showed severe diverticulosis.  I recommend she see PCP re: medication compliance - could see Jeanmarie Plant, NP  Please arrange Iva Boop, MD, [facility]

## 2013-03-11 ENCOUNTER — Telehealth: Payer: Self-pay | Admitting: Pulmonary Disease

## 2013-03-11 MED ORDER — LOSARTAN POTASSIUM 50 MG PO TABS: 50.0000 mg | ORAL_TABLET | Freq: Every day | ORAL | Status: AC

## 2013-03-11 NOTE — Telephone Encounter (Signed)
Per SN---  On amolodipine She will need additional meds to help with BP and circulation.  Suggests losartan 50 mg  1 daily And will need ROV in 1 month with SN.  appt has been scheduled with pt to see SN on 1-15 at 11.  Pt is aware of appt.  Pt is aware of meds sent to her pharmacy and pt to call back for any further recs.

## 2013-03-11 NOTE — Telephone Encounter (Signed)
Called and spoke with pt and she stated that for the last 2 days her BP has been elevated---today BP was 168/74 pulse was 82.  She feels that her circulation is not doing very good.  She has been taking her vitamins but feels this is not helping her.  She stated that her confusion is getting worse and feels that her thinking in not clear.  She is having a hard time sleeping and at times feels like she is hyper day and night.  Wanted to see what SN thoughts were on this.  SN please advise. Thanks  Allergies  Allergen Reactions  . Augmentin [Amoxicillin-Pot Clavulanate]   . Septra [Sulfamethoxazole-Tmp Ds]   . Shellfish Allergy   . Sulfamethoxazole-Trimethoprim Other (See Comments)    allergy to Sulfa w/ flu-like illness.  . Amoxicillin-Pot Clavulanate Other (See Comments)     "fuzzy-headed"     Current Outpatient Prescriptions on File Prior to Visit  Medication Sig Dispense Refill  . ALPRAZolam (XANAX) 0.25 MG tablet Take 1/2 to 1 tablet by mouth two times daily as needed for nervesTake 1/2 to 1 tablet by mouth two times daily as needed for nerves  60 tablet  1  . amLODipine (NORVASC) 5 MG tablet Take 1 tablet (5 mg total) by mouth daily.  30 tablet  6  . Multiple Vitamin (MULTIVITAMIN PO) Take 1 tablet by mouth daily.         No current facility-administered medications on file prior to visit.

## 2013-03-14 ENCOUNTER — Telehealth: Payer: Self-pay | Admitting: Neurology

## 2013-03-14 NOTE — Telephone Encounter (Signed)
I agree with plan

## 2013-03-14 NOTE — Telephone Encounter (Signed)
I returned patient's call. Her house keeper threw out one of her prescriptions and she could not remember what it was. We went over her medications: cozaar and norvasc and what they are used for. She wrote them both down. I let her know she has refills at her pharmacy and she can call them and let them know she needs her Rxs refilled. Patient thanked me for information.

## 2013-03-14 NOTE — Telephone Encounter (Signed)
Patient called requesting a prescription for something to relieve the symptoms she is having due to a blocked artery in her head. Patient states her eyes are getting blurry. Please call.

## 2013-03-19 ENCOUNTER — Ambulatory Visit (INDEPENDENT_AMBULATORY_CARE_PROVIDER_SITE_OTHER): Payer: Medicare Other | Admitting: Neurology

## 2013-03-19 ENCOUNTER — Encounter: Payer: Self-pay | Admitting: Neurology

## 2013-03-19 VITALS — BP 116/61 | HR 74 | Ht 62.0 in | Wt 93.0 lb

## 2013-03-19 DIAGNOSIS — I63219 Cerebral infarction due to unspecified occlusion or stenosis of unspecified vertebral arteries: Secondary | ICD-10-CM

## 2013-03-19 MED ORDER — CLOPIDOGREL BISULFATE 75 MG PO TABS: 75.0000 mg | ORAL_TABLET | Freq: Every day | ORAL | Status: DC

## 2013-03-19 NOTE — Patient Instructions (Signed)
Continue Aricept 10 mg daily for dementia and start Plavix 75 mg daily for stroke prevention and strict control of hypertension with blood pressure goal below 130/90. Return for followup in 3 months with Heide Guile, nurse practitioner a call earlier if necessary

## 2013-03-20 NOTE — Progress Notes (Signed)
[facility]  PATIENT: [name] DOB: 1924/10/12   REASON FOR VISIT: follow up HISTORY FROM: patient  HISTORY OF PRESENT ILLNESS: 79 year old right handed Caucasian female with long-standing mild memory difficulties likely due to 8 collated mild cognitive impairment. Remote history of right occipital infarct.  Update 03/19/13 ;She returns for followup of her last visit 3 months ago. She said that her memory is about unchanged. She did not increase his Aricept in fact she seems to have stopped at on the last visit. She is not able to tell me clearly the reason for doing so. She states that she perhaps run out of Aricept and she was tolerating tolerating it quite well and was perhaps doing a little better. She states this happened a few weeks ago. She complains of some intermittent pressure behind her eyes as well as some sharp right temporal pains which last only a few seconds and are not frequent. She still continues to live alone and a son proximal to her daily on the phone. She is independent in activities 11 except her son handles her finances. She has a maid who comes in once or twice a week 13. She says she not have any delusions, hallucinations, trouble with walking balance or falls. She is tolerating Plavix well without bleeding bruising of the side effects. 03/15/12 (PS): She returns for followup after her last visit with me on 01/18/2012. She states that she takes Plavix though I do not find listed on the medication list. She feels Plavix is causing her to have memory loss. I spent a lot of time explaining why this was not correct. She remains quite active and is independent in activities of daily living. She does to the gym 4 times a week and take special regularly now. She had blood work done on 01/18/2012 in which he showed normal TSH, homocystine, vitamin B12 and RPR was negative. An EEG dated 01/30/2012 showed no elective form activity and was normal. MRI  scan of the brain 02/08/2012 showed mild changes of chronic microvascular ischemia and generalized cerebral atrophy. No significant changes compared with prior scan from 12/01/2011. She has noted new complaints today.   10/31/12 (LL): Patient returns to office for follow up of memory loss. States she is having confusion at times and Dr. Kriste Basque has prescribed a medication to help her memory that starts with a "V". She complains that she has "this feeling" around her eyes which she has trouble putting into words. She states that it is not pain, but replies "yes" to a description of pressure. She states that it is worst in the mornings. She is unclear about what medications she is taking. She did not drive to the office today (called a cab) but states that she does drive sometimes. She reports that she still goes to the gym 4 days a week and stays active. She reports drinking pure beet juice that she gets at [employer] to help with her memory. She states that she is independent with all of her ADL's but lives alone and her sons live in [place].   UPDATE 12/24/12 (LL):  Patient was started on Aricept 5 mg at last visit.  She reports that she has had no side effects from the medication and she has improved on the MMSE from 21/30 last visit to 26/30 today.  She does report losing some weight. (3 lbs.)  She is accompanied by her son from [place], [place] who she has been staying with.  He hopes to have her move there permanently with him.  She has no new complaints.  REVIEW OF SYSTEMS: Full 14 system review of systems performed and notable only for:  constitutional: weight loss  cardiovascular: N/A respiratory: N/A  endocrine: N/A  ear/nose/throat: N/A  Eyes; N/A  Hematology/Lymph: N/A musculoskeletal: N/A  skin: N/A  genitourinary: N/A  Gastrointestinal: N/A  allergy/immunology: N/A  neurological: anxiety  sleep: N/A  psychiatric: N/A  ALLERGIES: Allergies  Allergen Reactions  . Augmentin  [Amoxicillin-Pot Clavulanate]   . Septra [Sulfamethoxazole-Tmp Ds]   . Shellfish Allergy   . Sulfamethoxazole-Trimethoprim Other (See Comments)    allergy to Sulfa w/ flu-like illness.  . Amoxicillin-Pot Clavulanate Other (See Comments)     "fuzzy-headed"    HOME MEDICATIONS: Outpatient Prescriptions Prior to Visit  Medication Sig Dispense Refill  . amLODipine (NORVASC) 5 MG tablet Take 1 tablet (5 mg total) by mouth daily.  30 tablet  6  . losartan (COZAAR) 50 MG tablet Take 1 tablet (50 mg total) by mouth daily.  30 tablet  6  . Multiple Vitamin (MULTIVITAMIN PO) Take 1 tablet by mouth daily.        Marland Kitchen ALPRAZolam (XANAX) 0.25 MG tablet Take 1/2 to 1 tablet by mouth two times daily as needed for nervesTake 1/2 to 1 tablet by mouth two times daily as needed for nerves  60 tablet  1   No facility-administered medications prior to visit.    PAST MEDICAL HISTORY: Past Medical History  Diagnosis Date  . Paralytic strabismus, third or oculomotor nerve palsy, partial   . Allergic rhinitis due to other allergen   . Mild hypertension   . Mitral valve prolapse   . Peripheral vascular disease   . Hypercholesteremia   . Goiter, unspecified   . Diverticulosis of colon   . Hemorrhoids   . Cystitis, unspecified   . Osteoarthritis   . Osteoporosis   . CVA (cerebral infarction)   . Cerebral atrophy   . Anxiety   . Memory impairment   . Mallory-Weiss tear with bleeding 07/29/2012    PAST SURGICAL HISTORY: Past Surgical History  Procedure Laterality Date  . Breast lumpectomy  1960's    benign breast biopsy  . Cholecystectomy  1980    at [facility]  Marland Kitchen Appendectomy  1980    at [facility].  . Abdominal hysterectomy  07/1980    hyst and rectocele repair by Dr. Dewaine Conger  . Oculoplastic eye surgery  12/1997    in [place]  . Vv stripping years ago    . Esophagogastroduodenoscopy N/A 07/29/2012    Procedure: ESOPHAGOGASTRODUODENOSCOPY (EGD);  Surgeon: Iva Boop, MD;  Location: [facility];  Service: Endoscopy;  Laterality: N/A;    FAMILY HISTORY: No family history on file.  SOCIAL HISTORY: History   Social History  . Marital Status: Widowed    Spouse Name: N/A    Number of Children: 3  . Years of Education: N/A   Occupational History  . 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 Smoking status: Never Smoker   . Smokeless tobacco: Never Used  . Alcohol Use: No  . Drug Use: No  . Sexual Activity: No   Other Topics Concern  . Not on file   Social History Narrative   Widow, lives alone           PHYSICAL EXAM  Filed Vitals:   03/19/13 1449  BP: 116/61  Pulse: 74  Height: 5\' 2"  (1.575 m)  Weight: 93 lb (42.185 kg)   Body mass index is 17.01 kg/(m^2).  Generalized: In no acute distress, pleasant elderly Caucasian female.  Neck: Supple, no carotid bruits  Cardiac: Regular rate rhythm, no murmur  Pulmonary: Clear to auscultation bilaterally  Musculoskeletal: No deformity   Neurological examination  Mentation: Alert. Language fluent. MMSE 21/30 with deficits in month, spelling backward, recall 2/3, AFT 10, Clock drawing 1/4, GDS 0.  Not clear on what medications she is taking.  (Last visit: MMSE 26/30 with DEFICITS IN ATTENTION AND CALCULATION (0/5 for Serial 7's and 2/5 Spelling "world" backwards), RECALL 2/3, AND DRAWING. CLOCK DRAWING 1/4, AFT 5 (normal 12+). Geriatric Depression Score 0. Patient states her age as "24."   MMSE 26/30, AFT 6 on 03/15/12, 25/30, AFT 17 on 01/18/12. ) Cranial nerve II-XII: Pupils were equal round reactive to light extraocular movements were full, visual field were full on confrontational test. facial sensation and strength were normal. hearing was intact to finger rubbing bilaterally. Uvula tongue midline. head turning and shoulder shrug and were normal and symmetric.Tongue protrusion into cheek strength was normal.  MOTOR: normal bulk and tone, full strength in the BUE,  BLE, fine finger movements normal, no pronator drift  SENSORY: normal and symmetric to light touch, pinprick, temperature, vibration and proprioception  COORDINATION: finger-nose-finger, heel-to-shin bilaterally, there was no truncal ataxia  REFLEXES: Brachioradialis 2/2, biceps 2/2, triceps 2/2, patellar 2/2, Achilles 2/2, plantar responses were flexor bilaterally.  GAIT/STATION: Rising up from seated position without assistance, normal stance, without trunk ataxia, moderate stride, good arm swing, smooth turning, able to perform tiptoe, and heel walking without difficulty.   DIAGNOSTIC DATA (LABS, IMAGING, TESTING) - I reviewed patient records, labs, notes, testing and imaging myself where available.  Lab Results  Component Value Date   WBC 10.0 02/26/2013   HGB 14.0 02/26/2013   HCT 41.7 02/26/2013   MCV 94.1 02/26/2013   PLT 298.0 02/26/2013      Component Value Date/Time   NA 136 02/26/2013 1457   K 4.9 02/26/2013 1457   CL 99 02/26/2013 1457   CO2 30 02/26/2013 1457   GLUCOSE 103* 02/26/2013 1457   BUN 16 02/26/2013 1457   CREATININE 1.0 02/26/2013 1457   CALCIUM 10.0 02/26/2013 1457   PROT 5.8* 07/28/2012 1328   ALBUMIN 3.0* 07/28/2012 1328   AST 17 07/28/2012 1328   ALT 16 07/28/2012 1328   ALKPHOS 76 07/28/2012 1328   BILITOT 0.4 07/28/2012 1328   GFRNONAA 79* 07/31/2012 0425   GFRAA >90 07/31/2012 0425   Lab Results  Component Value Date   CHOL 213* 09/11/2012   HDL 76.50 09/11/2012   LDLCALC 111* 09/12/2011   LDLDIRECT 123.4 09/11/2012   TRIG 77.0 09/11/2012   CHOLHDL 3 09/11/2012   Lab Results  Component Value Date   HGBA1C  Value: 5.5 (NOTE) The ADA recommends the following therapeutic goal for glycemic control related to Hgb A1c measurement: Goal of therapy: <6.5 Hgb A1c  Reference: American Diabetes Association: Clinical Practice Recommendations 2010, Diabetes Care, 2010, 33: (Suppl  1). 12/02/2008   Lab Results  Component Value Date   VITAMINB12 1185* 07/28/2012   Lab Results   Component Value Date   TSH 1.29 09/12/2011    MRI of the brain with and without contrast 02/08/2012:  Mild changes of chronic microvascular ischemia and generalized cerebral atrophy. Overall no significant changes compared with MRI scan dated 12/01/2011.  ASSESSMENT AND PLAN Ms. [name] is an 80 year old right-handed Caucasian female here with history of right occipital infarct and long-standing mild memory difficulties that are progressive; likely Alzheimer's dementia.   PLAN:   Continue Aricept 10 mg daily for dementia and start Plavix 75 mg daily for stroke prevention and strict control of hypertension with blood pressure goal below 130/90. Return for followup in 3 months with [name], nurse practitioner a call earlier if necessary   Delia Heady, MD  03/20/2013, 2:23 PM [facility] [address] [place], [place] [address] [phone]

## 2013-03-22 ENCOUNTER — Encounter: Payer: Self-pay | Admitting: Gastroenterology

## 2013-04-11 ENCOUNTER — Ambulatory Visit: Payer: Medicare Other | Admitting: Pulmonary Disease

## 2013-04-11 ENCOUNTER — Telehealth: Payer: Self-pay | Admitting: Pulmonary Disease

## 2013-04-11 NOTE — Telephone Encounter (Signed)
Called and spoke with pt and she is aware of appt with SN on 2/6.  Nothing further is needed.

## 2013-05-03 ENCOUNTER — Telehealth: Payer: Self-pay | Admitting: Neurology

## 2013-05-03 ENCOUNTER — Ambulatory Visit: Payer: Medicare Other | Admitting: Pulmonary Disease

## 2013-05-03 NOTE — Telephone Encounter (Signed)
Pt is calling back to say she is having more problems.  She has lost a lot of blood and her stomach hurts a lot.  Wants to know if you can get in with someone on Monday.  Please call and advise.

## 2013-05-03 NOTE — Telephone Encounter (Signed)
Spoke with patient and she seemed very confused, said that she is having a lot of rectal bleeding, and wanted to scheduled a sooner appt,suggested that she contact her gastro physician(Amy Esterwood)since she had seen patient for this problem before,but had a hard time comprehending the information,had to keep repeating it.  She said that the confusion was coming from so much  bleeding, has lost 15 lbs.

## 2013-05-03 NOTE — Telephone Encounter (Signed)
Spoke with son Remo Lipps) and informed that mother seemed very confused(per Donna)during conversation, so that he may possibly go and check on her, he verbalized understanding and said that he would.

## 2013-05-03 NOTE — Telephone Encounter (Signed)
Patient calling to state that she needs a sooner appointment than 06/20/13 since she is having problems with confusion. Please call patient back.

## 2013-05-08 ENCOUNTER — Telehealth: Payer: Self-pay | Admitting: Internal Medicine

## 2013-05-10 ENCOUNTER — Telehealth: Payer: Self-pay | Admitting: *Deleted

## 2013-05-10 ENCOUNTER — Telehealth: Payer: Self-pay | Admitting: Neurology

## 2013-05-10 NOTE — Telephone Encounter (Signed)
ERROR

## 2013-05-10 NOTE — Telephone Encounter (Signed)
Pt called stating she was returning a call from Charlott Holler.  She asked if she could call her back.  She did not give any additional information when asked.  Please call.

## 2013-05-10 NOTE — Telephone Encounter (Signed)
Spoke to patient and told her [name] recommended she go to the ER.  Patient said she should never have called, she just wants to come in sooner than March.  I have her scheduled for 05-23-13.  When I tried to call her back earlier and couldn't reach her, I phoned the son and told her what she was complaining about.  He said he would call her but she always complains about the clogged artery in her head.

## 2013-05-17 NOTE — Telephone Encounter (Signed)
Pt came in for her visit, closing encounter °

## 2013-05-23 ENCOUNTER — Ambulatory Visit: Payer: Self-pay | Admitting: Nurse Practitioner

## 2013-05-23 NOTE — Telephone Encounter (Signed)
Will contact Dr Jeannine Kitten office for this medication.

## 2013-05-27 ENCOUNTER — Telehealth: Payer: Self-pay | Admitting: Nurse Practitioner

## 2013-05-27 NOTE — Telephone Encounter (Signed)
Spoke with patient and informed that she should contact her gastro physician (Dr Trellis Paganini) since Ms Chauncy Passy has not seen her for stomach problems  but we could sched her for sooner appt for a f/u, scheduled appt, patient said that she would contact Dr Renato Gails number for their office). She was a little confused but agreed that she would contact them.Advised patient to also consider going to ER if she thought she was having stroke symptoms, did not wish to go.

## 2013-05-27 NOTE — Telephone Encounter (Signed)
Pt called back states she is having stomach problems and bleeding when having a bowel movement. Pt states she felt like she was having a stroke I advised her to go to the ER, pt states she doesn't feel like going there pt just wants to get in sooner to see NP/LL. PLease call pt back concerning this matter.

## 2013-05-29 ENCOUNTER — Telehealth: Payer: Self-pay | Admitting: Nurse Practitioner

## 2013-05-29 ENCOUNTER — Ambulatory Visit: Payer: Self-pay | Admitting: Nurse Practitioner

## 2013-05-29 NOTE — Telephone Encounter (Signed)
Patient was no show for appointment

## 2013-06-10 ENCOUNTER — Telehealth: Payer: Self-pay | Admitting: Neurology

## 2013-06-10 NOTE — Telephone Encounter (Signed)
Pt called and stated that she is having very bad stomach pains that have been going on for the past 3 days.  She says that the pain is making her nauseous, there is some confusion as well as her eyes are blurry. She says she feels like she may have had a stroke. She said her eyes are a little less blurry today than they have been the past 2 days. She asked if someone could call her back to discuss she would appreciate it.  Thank you

## 2013-06-10 NOTE — Telephone Encounter (Signed)
Spoke with patient, seemed some confused, explained that she should contact her pcp Lenna Gilford, gave her his number) for the nausea, stomach pains  since our office has never seen her for these problems before, only the blurry vision, gave her their number, verbalized some understanding.She said that she does not think she is having a stroke, only the stomach pains and nausea Called the patient's son and left message to call mom because she seemed a little confused.

## 2013-06-11 ENCOUNTER — Telehealth: Payer: Self-pay | Admitting: Pulmonary Disease

## 2013-06-11 ENCOUNTER — Telehealth: Payer: Self-pay | Admitting: Nurse Practitioner

## 2013-06-11 NOTE — Telephone Encounter (Signed)
Patient calling regarding having blurry vision and not feeling x3 days. She would like a call back at [phone].

## 2013-06-11 NOTE — Telephone Encounter (Signed)
I spoke to the patient and she complained of transient blurred vision in the morning when she gets up perhaps she is a little orthostatic as it does get better after a while. She still having some abdominal pain and cramps and seems little confused as to whether she is taking Aricept or not. She missed her followup appointment with Jeani Hawking on 3/4/ 15 I recommended she come in for an appointment soon within the next week with Charlott Holler, NP.

## 2013-06-11 NOTE — Telephone Encounter (Signed)
ATC PT line fast busy signal x 4 wcb

## 2013-06-12 ENCOUNTER — Ambulatory Visit: Payer: Medicare Other | Admitting: Adult Health

## 2013-06-12 NOTE — Telephone Encounter (Signed)
Spoke with pt. She is scheduled to come in and see TP this am. Nothing further needed

## 2013-06-17 ENCOUNTER — Telehealth: Payer: Self-pay | Admitting: Internal Medicine

## 2013-06-17 ENCOUNTER — Telehealth: Payer: Self-pay | Admitting: Neurology

## 2013-06-17 NOTE — Telephone Encounter (Signed)
Phone busy. I will continue to return call

## 2013-06-17 NOTE — Telephone Encounter (Signed)
Patient would like to see Dr. Julio Sicks for intermittent abdominal pain.  She is scheduled for 08/05/13 2:00

## 2013-06-17 NOTE — Telephone Encounter (Signed)
I spoke with patient and she is having stomach pain.  I explained to her again that we do not see her for stomach issues, but that she has seen Dr. Carlean Purl with [facility] for her stomach.  She seemed confused and did not remember seeing him.  I gave her the doctor's name and number and she repeated it back, said she would contact that office.

## 2013-06-20 ENCOUNTER — Encounter: Payer: Self-pay | Admitting: Nurse Practitioner

## 2013-06-20 ENCOUNTER — Ambulatory Visit (INDEPENDENT_AMBULATORY_CARE_PROVIDER_SITE_OTHER): Payer: Medicare Other | Admitting: Nurse Practitioner

## 2013-06-20 ENCOUNTER — Other Ambulatory Visit: Payer: Self-pay | Admitting: Physician Assistant

## 2013-06-20 VITALS — BP 136/78 | HR 82 | Ht 62.0 in | Wt 99.0 lb

## 2013-06-20 DIAGNOSIS — F0390 Unspecified dementia without behavioral disturbance: Secondary | ICD-10-CM

## 2013-06-20 DIAGNOSIS — F03B0 Unspecified dementia, moderate, without behavioral disturbance, psychotic disturbance, mood disturbance, and anxiety: Secondary | ICD-10-CM

## 2013-06-20 DIAGNOSIS — I63219 Cerebral infarction due to unspecified occlusion or stenosis of unspecified vertebral arteries: Secondary | ICD-10-CM | POA: Diagnosis not present

## 2013-06-20 MED ORDER — NAMENDA XR 28 MG PO CP24: 1.0000 | ORAL_CAPSULE | Freq: Every day | ORAL | Status: AC

## 2013-06-20 MED ORDER — CLOPIDOGREL BISULFATE 75 MG PO TABS: 75.0000 mg | ORAL_TABLET | Freq: Every day | ORAL | Status: AC

## 2013-06-20 NOTE — Patient Instructions (Addendum)
PLAN:  Continue Aricept 10 mg daily for dementia and Continue Plavix 75 mg daily for stroke prevention and strict control of hypertension with blood pressure goal below 130/90.   Start Titration Pack of Namenda XR.  When finished, fill prescription for 29 mg dose. Return for followup in 6 months, call earlier if necessary   Alzheimer Disease Alzheimer Disease (AD) is a mental disorder. It causes memory loss and loss of other mental functions, such as learning, thinking, solving problems, communicating, and completing tasks. The mental losses interfere with the ability to perform daily activities at work, at home, or in social situations. AD usually starts in the late 60s or early 37s but can start earlier in life (familial form). The mental changes caused by AD are permanent and worsen over time. As the illness progresses, the ability to do even the simplest things is lost. Survival with AD ranges from several years to as long as 20 years. CAUSES AD is caused by abnormally high levels of a protein (beta-amyloid) in the brain. This protein forms very small deposits within and around the brain's nerve cells. These deposits prevent the nerve cells from working properly. Experts are not certain what causes the beta-amyloid deposits in AD. RISK FACTORS The following major risk factors have been identified:  Increasing age.  Certain genetic variations, such as Down syndrome (trisomy 21). SYMPTOMS The earliest mental change in AD is mild memory loss of recent events, names, or phone numbers. Other symptoms at the beginning of AD include loss of objects, minor loss of vocabulary, and difficulty with complex tasks, such as paying bills or driving in unfamiliar locations. At this stage, you are still able to perform daily activities but need greater effort, more time, or memory aids. Other mental functions deteriorate as AD worsens. These changes slowly go from mild to severe. Symptoms at this stage  include:  Difficulty remembering You may not be able to recall personal information such as your address and telephone number. You may become confused about the date, the season of the year, or your location.  Difficulty maintaining attention You may forget what you wanted to say during conversations and repeat what you have already said.  Difficulty learning new information or tasks You may not remember what you read or the name of a new friend you met.  Difficulty counting or doing math You may have difficulty with complex math problems. You may make mistakes in paying bills or managing your checkbook.  Poor reasoning and judgment You may make poor decisions or not dress right for the weather.  Difficulty communicating You may have regular difficulty remembering words, naming objects, expressing yourself clearly, or writing sentences that make sense.  Difficulty performing familiar daily activities You may get lost driving in familiar locations or need help eating, bathing, dressing, grooming, or using the toilet. You may have difficulty maintaining bladder or bowel control.  Difficulty recognizing familiar faces You may confuse family members or close friends with one another. You may not recognize a close relative or may mistake strangers for family. AD also may cause changes in personality and behavior. These changes include loss of interest or motivation, social withdrawal, anxiety, difficulty sleeping, uncharacteristic anger or combativeness, a false belief that someone is trying to harm you (paranoia), seeing things that are not real (hallucinations), or agitation. Confusion and disruptive behavior are often worse at night and may be triggered by changes in the environment or acute medical issues. DIAGNOSIS  AD is diagnosed through  an assessment by your health care provider. During this assessment, your health care provider will do the following:  Ask you and your family, friends, or  caregiver questions about your symptoms, their frequency, their duration and progression, and the effect they are having on your life.  Ask questions about your personal and family medical history and use of alcohol or drugs, including prescription medicine.  Perform a physical exam and order blood tests and brain imaging exams. Your health care provider may refer you to a specialist for detailed evaluation of your mental functions (neuropsychological testing).  Many different brain disorders, medical conditions, and certain substances can cause symptoms that resemble AD symptoms. These must be ruled out before AD can be diagnosed. If AD is diagnosed, it will be considered either "possible" or "probable" AD. "Possible" AD means that your symptoms are typical of AD and no other disorder is causing them. "Probable" AD means that you also have a family history of AD or genetic test results that support the diagnosis. Certain tests, mostly used in research studies, are highly specific for AD.  TREATMENT  There is currently no cure for AD. The goals of treatment are to:  Slow down the progression of the disease.  Preserve mental function as long as possible.  Manage behavioral symptoms.  Make life easier for the person with AD and their caregivers. The following treatment options are available:  Medicine Certain medicines may help slow memory loss by changing the level of certain chemicals in the brain. Medicine may also help with behavioral symptoms.  Talk therapy Talk therapy provides education, support, and memory aids for people with AD. It is most effective in the early stages of the illness.  Caregiving Caregivers may be family members, friends, or trained medical professionals. They help the person with AD with daily life activities. Caregiving may take place at home or at a nursing facility.  Family support groups These provide education, emotional support, and information about community  resources to family members who are taking care of the person with AD. Document Released: 11/24/2003 Document Revised: 11/14/2012 Document Reviewed: 07/20/2012 [facility] Patient Information 2014 [place], [place].

## 2013-06-20 NOTE — Assessment & Plan Note (Signed)
MMSE 16/30, on Aricept.

## 2013-06-20 NOTE — Progress Notes (Signed)
PATIENT: [name] DOB: [birth year]  REASON FOR VISIT: follow up for occlusion and stenosis of vertebral artery/moderate dementia HISTORY FROM: patient  HISTORY OF PRESENT ILLNESS: 80 year old right handed Caucasian female with long-standing mild memory difficulties likely due to Alzheimer's Dementia. Remote history of right occipital infarct.   Update 06/20/13 (LL):  [name] returns for acute visit for "clogged artery in her head" with her son from [place]. She has called the office frequently for abdominal pain, often very confused.  She cannot recall what medications she is taking. Her son was notified by his brother (that lives locally) that their mother is increasingly confused. I had spoken to both sons 6 months ago about their mother's dementia and that she should not be living alone, yet her son went back to [facility] without her and the local son does not seem to be involved.  Update 03/19/13 (PS): She returns for followup of her last visit 3 months ago. She said that her memory is about unchanged. She did not increase his Aricept in fact she seems to have stopped at on the last visit. She is not able to tell me clearly the reason for doing so. She states that she perhaps run out of Aricept and she was tolerating tolerating it quite well and was perhaps doing a little better. She states this happened a few weeks ago. She complains of some intermittent pressure behind her eyes as well as some sharp right temporal pains which last only a few seconds and are not frequent. She still continues to live alone and a son proximal to her daily on the phone. She is independent in activities 76 except her son handles her finances. She has a maid who comes in once or twice a week 13. She says she not have any delusions, hallucinations, trouble with walking balance or falls. She is tolerating Plavix well without bleeding bruising of the side effects.   12/24/12 (LL): Patient was  started on Aricept 5 mg at last visit. She reports that she has had no side effects from the medication and she has improved on the MMSE from 21/30 last visit to 26/30 today. She does report losing some weight. (3 lbs.) She is accompanied by her son from [place], [place] who she has been staying with. He hopes to have her move there permanently with him. She has no new complaints.  10/31/12 (LL): Patient returns to office for follow up of memory loss. States she is having confusion at times and Dr. Lenna Gilford has prescribed a medication to help her memory that starts with a "V". She complains that she has "this feeling" around her eyes which she has trouble putting into words. She states that it is not pain, but replies "yes" to a description of pressure. She states that it is worst in the mornings. She is unclear about what medications she is taking. She did not drive to the office today (called a cab) but states that she does drive sometimes. She reports that she still goes to the gym 4 days a week and stays active. She reports drinking pure beet juice that she gets at [employer] to help with her memory. She states that she is independent with all of her ADL's but lives alone and her sons live in [place].   03/15/12 (PS): She returns for followup after her last visit with me on 01/18/2012. She states that she takes Plavix though I do not find listed on the medication  list. She feels Plavix is causing her to have memory loss. I spent a lot of time explaining why this was not correct. She remains quite active and is independent in activities of daily living. She does to the gym 4 times a week and take special regularly now. She had blood work done on 01/18/2012 in which he showed normal TSH, homocystine, vitamin B12 and RPR was negative. An EEG dated 01/30/2012 showed no elective form activity and was normal. MRI scan of the brain 02/08/2012 showed mild changes of chronic microvascular ischemia and generalized cerebral  atrophy. No significant changes compared with prior scan from 12/01/2011. She has noted new complaints today.   REVIEW OF SYSTEMS: Full 14 system review of systems performed and notable only for:  Eye pain, abdominal pain, rectal bleeding, diarrhea, anxiety  ALLERGIES: Allergies  Allergen Reactions  . Augmentin [Amoxicillin-Pot Clavulanate]   . Septra [Sulfamethoxazole-Tmp Ds]   . Shellfish Allergy   . Sulfamethoxazole-Trimethoprim Other (See Comments)    allergy to Sulfa w/ flu-like illness.  . Amoxicillin-Pot Clavulanate Other (See Comments)     "fuzzy-headed"    HOME MEDICATIONS: Outpatient Prescriptions Prior to Visit  Medication Sig Dispense Refill  . amLODipine (NORVASC) 5 MG tablet Take 1 tablet (5 mg total) by mouth daily.  30 tablet  6  . donepezil (ARICEPT) 10 MG tablet       . losartan (COZAAR) 50 MG tablet Take 1 tablet (50 mg total) by mouth daily.  30 tablet  6  . Multiple Vitamin (MULTIVITAMIN PO) Take 1 tablet by mouth daily.        . clopidogrel (PLAVIX) 75 MG tablet Take 1 tablet (75 mg total) by mouth daily.  30 tablet  11   No facility-administered medications prior to visit.     PHYSICAL EXAM  Filed Vitals:   06/20/13 1428  BP: 136/78  Pulse: 82  Height: 5\' 2"  (1.575 m)  Weight: 99 lb (44.906 kg)   Body mass index is 18.1 kg/(m^2).  Generalized: Well developed, in no acute distress  Cardiac: Regular rate rhythm, no murmur   Neurological examination  Mentation: Alert oriented self only.  Follows 1 step commands, speech and language fluent Cranial nerve II-XII: MMSE 16/30 WITH DEFICITS IN TIME, PLACE, ATTENTION AND CALCULATION, DELAYED RECALL 1/3, COPYING POLYGONS, CLOCK DRAWING 1/4, ANIMAL FLUENCY TEST 7 ONLY.   Pupils were equal round reactive to light extraocular movements were full, visual field were full on confrontational test. Facial sensation and strength were normal. hearing was intact to finger rubbing bilaterally. Uvula tongue midline. head  turning and shoulder shrug and were normal and symmetric.Tongue protrusion into cheek strength was normal. Motor: The motor testing reveals 5 over 5 strength of all 4 extremities. Good symmetric motor tone is noted throughout.  Sensory: Sensory testing is intact to pinprick, soft touch, vibration sensation, and position sense on all 4 extremities. No evidence of extinction is noted.  Coordination: Cerebellar testing reveals good finger-nose-finger and heel-to-shin bilaterally.  Gait and station: Gait is normal. Tandem gait is normal. Romberg is negative. No drift is seen.  Reflexes: Deep tendon reflexes are symmetric and normal bilaterally.  ASSESSMENT AND PLAN [name] is an 80 year old right-handed Caucasian female here with history of right occipital infarct and long-standing mild memory difficulties that are progressive; likely Alzheimer's dementia, now in the moderate stage, MMSE 16/30.  I have stressed to son today that his mother can no longer safely live by herself.  He  plans to take her to live with him in [place], but he also told me that was the plan 6 months ago. I have told him that she is not to drive.  She needs help with medication administration and daily supervision.   PLAN:  Today's visit was an extended visit, 45 minutes in length, with extensive discussion on diagnosis of Alzheimer's Dementia, course and progression of disease and the level of supervision that [name] needs.  Son tells me that he is moving his mother to [place] to live with him. All questions were answered at the time of visit. Continue Aricept 10 mg daily for dementia and Continue Plavix 75 mg daily for stroke prevention and strict control of hypertension with blood pressure goal below 130/90. Start Titration Pack of Namenda XR.  When finished, fill prescription for 28 mg dose. Advised to request records when established with Neurologist in [place], [place] ordered this encounter    Medications  . clopidogrel (PLAVIX) 75 MG tablet    Sig: Take 1 tablet (75 mg total) by mouth daily.    Dispense:  90 tablet    Refill:  3    Order Specific Question:  Supervising Provider    Answer:  Leonie Man, PRAMOD S [[id number]]  . NAMENDA XR 28 MG CP24    Sig: Take 28 mg by mouth daily.    Dispense:  30 capsule    Refill:  11    Order Specific Question:  Supervising Provider    Answer:  Garvin Fila [[id number]]   No Follow-up on file.  Philmore Pali, MSN, NP-C 06/20/2013, 8:26 PM [facility] [address] [place], [place] [address] [phone]  Note: This document was prepared with digital dictation and possible smart phrase technology. Any transcriptional errors that result from this process are unintentional.

## 2013-06-21 ENCOUNTER — Other Ambulatory Visit: Payer: Self-pay | Admitting: Physician Assistant

## 2013-06-24 ENCOUNTER — Telehealth: Payer: Self-pay | Admitting: Internal Medicine

## 2013-06-24 NOTE — Telephone Encounter (Signed)
We refilled this in Dec because she was seeing Amy Esterwood PA-C  That day, but she noted that Dr. Lenna Gilford usually refills her generic xanax.  Perhaps patient misunderstood or did Dr. Lenna Gilford want [place] to ask Dr. Carlean Purl about this refill.  Thanks for your help.

## 2013-06-24 NOTE — Telephone Encounter (Signed)
SN please advise if ok to refill the alprazolam for the pt.  Thanks  Allergies  Allergen Reactions  . Augmentin [Amoxicillin-Pot Clavulanate]   . Septra [Sulfamethoxazole-Tmp Ds]   . Shellfish Allergy   . Sulfamethoxazole-Trimethoprim Other (See Comments)    allergy to Sulfa w/ flu-like illness.  . Amoxicillin-Pot Clavulanate Other (See Comments)     "fuzzy-headed"

## 2013-06-25 MED ORDER — ALPRAZOLAM 0.25 MG PO TABS: 0.2500 mg | ORAL_TABLET | Freq: Two times a day (BID) | ORAL | Status: DC | PRN

## 2013-06-25 NOTE — Telephone Encounter (Signed)
Called and spoke with pt to confirm the Xanax refill and pt is aware xanax was called in. Called [facility] and left VM for script.

## 2013-06-25 NOTE — Telephone Encounter (Signed)
Per SN---  Ok to refill the xanax.  thanks

## 2013-07-09 DIAGNOSIS — IMO0002 Reserved for concepts with insufficient information to code with codable children: Secondary | ICD-10-CM | POA: Diagnosis not present

## 2013-07-09 DIAGNOSIS — Y33XXXA Other specified events, undetermined intent, initial encounter: Secondary | ICD-10-CM | POA: Diagnosis not present

## 2013-07-09 DIAGNOSIS — S32509A Unspecified fracture of unspecified pubis, initial encounter for closed fracture: Secondary | ICD-10-CM | POA: Diagnosis not present

## 2013-07-09 DIAGNOSIS — W010XXA Fall on same level from slipping, tripping and stumbling without subsequent striking against object, initial encounter: Secondary | ICD-10-CM | POA: Diagnosis not present

## 2013-07-10 DIAGNOSIS — S32509A Unspecified fracture of unspecified pubis, initial encounter for closed fracture: Secondary | ICD-10-CM | POA: Diagnosis not present

## 2013-07-10 DIAGNOSIS — W19XXXA Unspecified fall, initial encounter: Secondary | ICD-10-CM | POA: Diagnosis not present

## 2013-07-10 DIAGNOSIS — S329XXA Fracture of unspecified parts of lumbosacral spine and pelvis, initial encounter for closed fracture: Secondary | ICD-10-CM | POA: Diagnosis not present

## 2013-07-18 ENCOUNTER — Encounter: Payer: Self-pay | Admitting: Gastroenterology

## 2013-07-18 DIAGNOSIS — R5383 Other fatigue: Secondary | ICD-10-CM | POA: Diagnosis not present

## 2013-07-18 DIAGNOSIS — I10 Essential (primary) hypertension: Secondary | ICD-10-CM | POA: Diagnosis not present

## 2013-07-18 DIAGNOSIS — Z7189 Other specified counseling: Secondary | ICD-10-CM | POA: Diagnosis not present

## 2013-07-18 DIAGNOSIS — R5381 Other malaise: Secondary | ICD-10-CM | POA: Diagnosis not present

## 2013-07-18 DIAGNOSIS — S329XXA Fracture of unspecified parts of lumbosacral spine and pelvis, initial encounter for closed fracture: Secondary | ICD-10-CM | POA: Diagnosis not present

## 2013-07-22 ENCOUNTER — Telehealth: Payer: Self-pay | Admitting: Nurse Practitioner

## 2013-07-22 NOTE — Telephone Encounter (Signed)
Patient was given samples of Namenda XR which she took--went to get Rx filled at pharmacy but it was too expensive--is there a generic or another drug--patient is in [place], [place] and went to [place] there but does not have telephone number--please call--thank you.

## 2013-07-22 NOTE — Telephone Encounter (Signed)
I called back and spoke with Zenia Resides.  He thinks the patient could be in her coverage gap, which is why co-pay is so high.  I told him I would gladly fax a free trial (30 day free Rx voucher) to the pharmacy for him, but since we see over [facility] in [place], I would need more specific info. He decided he would like it sent to [facility] on [address].   I have faxed them a voucher for one free month at [phone] BIN# [id number] PCN# [id number] GRP # [id number] ID# [id number] We are hoping this will allow the patient more time to reach her deductible before a refill is needed.  When she needs a refill, if co-pay is still too expensive, they will call us back.

## 2013-07-23 ENCOUNTER — Telehealth: Payer: Self-pay | Admitting: *Deleted

## 2013-07-23 NOTE — Telephone Encounter (Signed)
I have re-faxed the voucher.  This is something that can be obtained at [email or web address].  I called the pharmacy and they said they did get the voucher, but said the patient took the Rx back, so they couldn't fill it.  I gave them verbal order for Rx, so it could be filled with the voucher.  I called back.  Spoke with son, he will follow up with the pharmacy and call us back if needed.

## 2013-07-23 NOTE — Telephone Encounter (Signed)
Son Zenia Resides) stated that pharmacy never received voucher for Nameda XR.  He's wanting to know if you could mail 30 day sample to his home @ [place], [place].  Please call and advise.  Thanks

## 2013-07-24 DIAGNOSIS — IMO0002 Reserved for concepts with insufficient information to code with codable children: Secondary | ICD-10-CM | POA: Diagnosis not present

## 2013-07-24 DIAGNOSIS — M1710 Unilateral primary osteoarthritis, unspecified knee: Secondary | ICD-10-CM | POA: Diagnosis not present

## 2013-08-05 ENCOUNTER — Ambulatory Visit: Payer: Medicare Other | Admitting: Internal Medicine

## 2013-08-23 DIAGNOSIS — R109 Unspecified abdominal pain: Secondary | ICD-10-CM | POA: Diagnosis not present

## 2013-08-23 DIAGNOSIS — R1032 Left lower quadrant pain: Secondary | ICD-10-CM | POA: Diagnosis not present

## 2013-08-23 DIAGNOSIS — R197 Diarrhea, unspecified: Secondary | ICD-10-CM | POA: Diagnosis not present

## 2013-08-23 DIAGNOSIS — S3282XA Multiple fractures of pelvis without disruption of pelvic ring, initial encounter for closed fracture: Secondary | ICD-10-CM | POA: Diagnosis not present

## 2013-08-26 ENCOUNTER — Telehealth: Payer: Self-pay | Admitting: Neurology

## 2013-08-26 NOTE — Telephone Encounter (Signed)
Patient's son Zenia Resides calling to state that they finished their free samples of Namenda and when they took the script to the pharmacy, they discovered that it is too expensive for them. Allen requesting a call back with advise for the next steps.

## 2013-08-26 NOTE — Telephone Encounter (Signed)
The patient can get one free month if they get the voucher from [email or web address].  As well they can contact the patient assistance program at [phone].  I called back relayed this info to [place].  He will contact the program and call us back if needed.

## 2013-09-02 ENCOUNTER — Other Ambulatory Visit: Payer: Self-pay | Admitting: Pulmonary Disease

## 2013-09-23 DIAGNOSIS — M65849 Other synovitis and tenosynovitis, unspecified hand: Secondary | ICD-10-CM | POA: Diagnosis not present

## 2013-09-23 DIAGNOSIS — M65839 Other synovitis and tenosynovitis, unspecified forearm: Secondary | ICD-10-CM | POA: Diagnosis not present

## 2013-09-30 ENCOUNTER — Telehealth: Payer: Self-pay | Admitting: Pulmonary Disease

## 2013-09-30 NOTE — Telephone Encounter (Signed)
Called and spoke with pt and she is requesting an appt with SN for follow up.  appt has been scheduled on Wednesday at 66 and the pt and her son are aware.

## 2013-10-01 ENCOUNTER — Encounter: Payer: Self-pay | Admitting: Pulmonary Disease

## 2013-10-01 ENCOUNTER — Ambulatory Visit (INDEPENDENT_AMBULATORY_CARE_PROVIDER_SITE_OTHER): Payer: Medicare Other | Admitting: Pulmonary Disease

## 2013-10-01 ENCOUNTER — Ambulatory Visit: Payer: Medicare Other | Admitting: Pulmonary Disease

## 2013-10-01 ENCOUNTER — Other Ambulatory Visit (INDEPENDENT_AMBULATORY_CARE_PROVIDER_SITE_OTHER): Payer: Medicare Other

## 2013-10-01 VITALS — BP 110/60 | HR 72 | Temp 97.2°F | Ht 61.0 in | Wt 92.8 lb

## 2013-10-01 DIAGNOSIS — F411 Generalized anxiety disorder: Secondary | ICD-10-CM

## 2013-10-01 DIAGNOSIS — E785 Hyperlipidemia, unspecified: Secondary | ICD-10-CM | POA: Diagnosis not present

## 2013-10-01 DIAGNOSIS — I10 Essential (primary) hypertension: Secondary | ICD-10-CM | POA: Diagnosis not present

## 2013-10-01 DIAGNOSIS — F0390 Unspecified dementia without behavioral disturbance: Secondary | ICD-10-CM

## 2013-10-01 DIAGNOSIS — E538 Deficiency of other specified B group vitamins: Secondary | ICD-10-CM | POA: Diagnosis not present

## 2013-10-01 DIAGNOSIS — D649 Anemia, unspecified: Secondary | ICD-10-CM

## 2013-10-01 DIAGNOSIS — I059 Rheumatic mitral valve disease, unspecified: Secondary | ICD-10-CM

## 2013-10-01 DIAGNOSIS — M810 Age-related osteoporosis without current pathological fracture: Secondary | ICD-10-CM

## 2013-10-01 DIAGNOSIS — E049 Nontoxic goiter, unspecified: Secondary | ICD-10-CM

## 2013-10-01 DIAGNOSIS — K5730 Diverticulosis of large intestine without perforation or abscess without bleeding: Secondary | ICD-10-CM

## 2013-10-01 DIAGNOSIS — I739 Peripheral vascular disease, unspecified: Secondary | ICD-10-CM

## 2013-10-01 DIAGNOSIS — I63219 Cerebral infarction due to unspecified occlusion or stenosis of unspecified vertebral arteries: Secondary | ICD-10-CM

## 2013-10-01 DIAGNOSIS — M1990 Unspecified osteoarthritis, unspecified site: Secondary | ICD-10-CM

## 2013-10-01 DIAGNOSIS — F03B0 Unspecified dementia, moderate, without behavioral disturbance, psychotic disturbance, mood disturbance, and anxiety: Secondary | ICD-10-CM

## 2013-10-01 DIAGNOSIS — G319 Degenerative disease of nervous system, unspecified: Secondary | ICD-10-CM

## 2013-10-01 LAB — CBC WITH DIFFERENTIAL/PLATELET
BASOS ABS: 0.1 10*3/uL (ref 0.0–0.1)
BASOS PCT: 0.6 % (ref 0.0–3.0)
Eosinophils Absolute: 0.1 10*3/uL (ref 0.0–0.7)
Eosinophils Relative: 1 % (ref 0.0–5.0)
HEMATOCRIT: 41.2 % (ref 36.0–46.0)
HEMOGLOBIN: 14 g/dL (ref 12.0–15.0)
Lymphocytes Relative: 15.3 % (ref 12.0–46.0)
Lymphs Abs: 1.4 10*3/uL (ref 0.7–4.0)
MCHC: 34.1 g/dL (ref 30.0–36.0)
MCV: 94.9 fl (ref 78.0–100.0)
MONOS PCT: 8.1 % (ref 3.0–12.0)
Monocytes Absolute: 0.7 10*3/uL (ref 0.1–1.0)
NEUTROS ABS: 6.7 10*3/uL (ref 1.4–7.7)
Neutrophils Relative %: 75 % (ref 43.0–77.0)
Platelets: 415 10*3/uL — ABNORMAL HIGH (ref 150.0–400.0)
RBC: 4.34 Mil/uL (ref 3.87–5.11)
RDW: 13 % (ref 11.5–15.5)
WBC: 8.9 10*3/uL (ref 4.0–10.5)

## 2013-10-01 LAB — HEPATIC FUNCTION PANEL
ALT: 23 U/L (ref 0–35)
AST: 28 U/L (ref 0–37)
Albumin: 3.9 g/dL (ref 3.5–5.2)
Alkaline Phosphatase: 75 U/L (ref 39–117)
BILIRUBIN TOTAL: 0.7 mg/dL (ref 0.2–1.2)
Bilirubin, Direct: 0.1 mg/dL (ref 0.0–0.3)
Total Protein: 7.2 g/dL (ref 6.0–8.3)

## 2013-10-01 LAB — IBC PANEL
Iron: 111 ug/dL (ref 42–145)
SATURATION RATIOS: 36.1 % (ref 20.0–50.0)
Transferrin: 219.4 mg/dL (ref 212.0–360.0)

## 2013-10-01 LAB — BASIC METABOLIC PANEL
BUN: 17 mg/dL (ref 6–23)
CALCIUM: 10.4 mg/dL (ref 8.4–10.5)
CO2: 30 mEq/L (ref 19–32)
Chloride: 99 mEq/L (ref 96–112)
Creatinine, Ser: 0.9 mg/dL (ref 0.4–1.2)
GFR: 62.88 mL/min (ref >60.00)
GLUCOSE: 82 mg/dL (ref 70–99)
Potassium: 4.8 mEq/L (ref 3.5–5.1)
Sodium: 136 mEq/L (ref 135–145)

## 2013-10-01 LAB — VITAMIN B12

## 2013-10-01 LAB — SEDIMENTATION RATE: SED RATE: 18 mm/h (ref 0–22)

## 2013-10-01 LAB — TSH: TSH: 1.73 u[IU]/mL (ref 0.35–4.50)

## 2013-10-01 MED ORDER — DICYCLOMINE HCL 10 MG PO CAPS: 10.0000 mg | ORAL_CAPSULE | Freq: Three times a day (TID) | ORAL | Status: AC | PRN

## 2013-10-01 MED ORDER — PANTOPRAZOLE SODIUM 40 MG PO TBEC: 40.0000 mg | DELAYED_RELEASE_TABLET | Freq: Every day | ORAL | Status: AC

## 2013-10-01 NOTE — Patient Instructions (Signed)
Today we updated your med list in our EPIC system...    Continue your current medications the same...  Today we did your follow up blood work...    We will contact you w/ the results when available...   Please restart Protonix (Pantoprazole) 40mg  tabs- take one tab about 30 min before the 1st meal of the day...  Add some fiber to the diet w/ fruits, veggies, etc & take METAMUCIL daily as well...  You may also use the new Bentyl (Dicyclomine) 10mg  tabs- take one tab up to 3 times daily as needed for abd cramping...  For the night sweats- try the herbal supplement called Black Cohosh (check it out at the drug store vs [facility])...  To aide in weight gain>     Be sure you are eating 3 meals a day, take your time but try to eat a full meal each time...    Add a nutritional supplement like ENSURE- 1 can twice daily betw meals...  Call for any questions.Marland KitchenMarland Kitchen

## 2013-10-01 NOTE — Progress Notes (Addendum)
Subjective:    Patient ID: [name], female    DOB: 1924-06-07, 80 y.o.   MRN: [id number]  HPI 80 y/o WF here for a follow up visit... she has multiple medical problems as noted below...  Followed for general medical purposes w/ hx of HBP, mild MVP, ASPVD, Goiter, Divertics, DJD w/ left THR, Osteoporosis, hx right occipital stoke & 3rd N paresis, Memory loss, & mod severe anxiety...  ~  February 28, 2011:  46mo ROV & she is doing well overall just c/o some gas & we discussed Simethacone preps for this- Mylicon, GasX, Phazyme, etc...  BP well controlled on Amlodip & she denies CP, palpit, SOB, edema; she continues to exercise regularly at the gym; she remains on Plavix & denies cerebral ischemic symptoms; we reviewed labs from 7/12 and FLP ok on her diet + FishOil and Thyroid remains wnl; hx DJD w/ prev left THR & she uses OTC analgesics as needed; she is still not using the Alpraz anxiolytic...  She had the Shingles vaccine & the Flu shot this yr; Plavix & Amlodip refilled per request...  ~  Jul 29, 2011:  63mo ROV & [name] is added-on for mult symptoms> states BP has been up (readings 150-160 at home) & she is very concerned "I can feel it when it's up- I'm more anxious, not as calm" she will take 1/4th of an Alpraz0.5mg  tab & this helps she says;  Also c/o eyes feel strained & left arm/ shoulder discomfort- but it's not too bad & she refuses Ortho eval... We decided to add LOSARTAN 50mg /d to her regimen & recheck her BP in about 6 weeks... We reviewed her prob list, meds, Xrays & labs>>  BMD 5/13:  Severe osteoporosis w/ TScores -2.9 in Spine & -3.3 in Femur; also measured -5.2 in forearm/Radius... rec to start Reclast yearly...  ~  September 19, 2011:  6wk [place] notes some trouble w/ her memory (see below) and is here to review the need for Reclast to treat her osteoporosis (see below);  Oddly enough- while she refuses interventions for these important medical issues, her CC today is a minor  discoloration of her nails that concerns her & she is referred to [facility] for this analysis...    HBP> on Norvasc10, Cozaar50;  BP= 142/72 & she denies CP, palpit, SOB, edema...    MVP> she denies CP, palpit, and exercises daily at he gym...    ASPVD> on Plavix75; she denies cerebral ischemic symptoms...    Chol> on FishOil; FLP looks ok showing TChol 190, TG 88, HDL 61, LDL 111    Goiter> palp thyroid, no change, and euthyroid w/ TSH=1.29...    GI> Divertics, Hems> on Metamucil;     DJD> on Glucosamine etc; she denies neck discomfort etc & still works out daily in [employer]...    Osteoporosis> on Calcium, VitD, MVI; BMD 5/13 showed severe osteoporosis w/ TScores -2.9 in Spine & -3.3 in Femur; also measured -5.2 in forearm/Radius> she was asked to start Reclast & show TP 5/13 but couldn't make a decision; now she tells me she has decided NOT to take Reclast or any other osteoporosis therapy; I carefully reviewed the serious consequences of her decision & offered her second opinions w/ GYN, Endocrine, Rheum, whatever but she declines; I further offered to explain to her son but she also declined this intervention...    Hx stroke & cerebral atrophy> on Plavix75; she is noting problems w/ her memory as  she is having to write herself notes; offered Aricept vs Neuro but she declines new meds or referral...    Anxiety> on Xanax0.25 prn; she'l take 1/2 tab prn & notes that it helps... We reviewed prob list, meds, xrays and labs> see below>>  LABS 6/13:  FLP- at goals x LDL=111;  Chems- ok;  CBC- ok;  TSH=1.29;  VitD=50  ~  March 13, 2012:  76mo ROV & [name] is stable but has mult somatic complaints that she blames on her Plavix rx;  We reviewed the following medical problems during today's office visit >>     HBP> on Norvasc10, Cozaar50;  BP= 142/72 & she denies CP, palpit, SOB, edema...    MVP> she denies CP, palpit, and exercises daily at he gym...    ASPVD> on ASA81, Plavix75; she denies cerebral  ischemic symptoms...    Chol> on FishOil; she has refused any & all statin meds; FLP 6/13 looked ok showing TChol 190, TG 88, HDL 61, LDL 111    Goiter> palp thyroid, no change, and euthyroid w/ TSH=1.29...    GI> Divertics, Hems> on Metamucil;     DJD> on Glucosamine, bioflavinoids, etc; she denies neck discomfort etc & still works out daily in the gym...    Osteoporosis> on Calcium, VitD, MVI; BMD 5/13 showed severe osteoporosis w/ TScores -2.9 in Spine & -3.3 in Femur; also measured -5.2 in forearm/Radius> she was asked to start Reclast & saw TP 5/13 but couldn't make a decision; now she tells me she has decided NOT to take Reclast or any other osteoporosis therapy; I carefully reviewed the serious consequences of her decision & offered her second opinions w/ GYN, Endocrine, Rheum, whatever but she declines; I further offered to explain to her son but she also declined this intervention...    Hx stroke & cerebral atrophy> on ASA81, Plavix75; she had right occipital infarct 9/10; she is followed by Neuro, Leonie Man- note 8/13 from NP is reviewed> MMSE 24/30, mult somatic complaints, anxious & wanted MRI since several relatives w/ brain tumors- MRI 11/13 showed chr microvasc ischemic dis & generalized cerebral atrophy, no acute ischemia, no lesions seen...    Anxiety> on Xanax0.25 prn; she'll take 1/2 tab prn & notes that it helps... We reviewed prob list, meds, xrays and labs> see below for updates >> she had the 2013 Flu vaccine 10/13...  ~  Aug 09, 2012:  86mo ROV & post hosp check> [name] was Adm 5/3 - 07/31/12 by [facility] w/ hematemesis & rectal bleeding assoc w/ weakness & lightheadedness with an initial  Hg=9.2 that dropped to 6.5; Aspirin & Plavix were held (she wanted to blame the Plavix for the bleeding); EGD by DrGessner showed a Mallory-Weiss tear in upper stomach & she was placed on PPI, transfused 1u, started on Iron & Hg improved to 8.8 by discharge..  Since her disch she is improved but still  confused & here w/ her son from [place]...  We reviewed the following medical problems during today's office visit >>     HBP> on Norvasc10 & off prev Cozaar50 (she stopped this on her own- confused about meds);  BP= 110/60 & she denies CP, palpit, SOB, edema; we decided to leave the ARB off as it looks like BP adeq controlled on Amlod alone...    MVP> she denies CP, palpit, and exercises daily at he gym...    ASPVD> Plavix was stopped 5/14 hosp & she will restart ASA81; she denies cerebral ischemic symptoms.Marland KitchenMarland Kitchen  Chol> on FishOil; she has refused any & all statin meds; FLP 6/13 looked ok showing TChol 190, TG 88, HDL 61, LDL 111    Goiter> palp thyroid, no change, and euthyroid w/ TSH=1.29 ([id number])...    GI- Mallory-Weiss tear w/ GIB 5/13> on Protonix40Bid & off Plavix now; denies abd pain, dysphagia, n/v, etc...    Divertics, Hems> on Metamucil;     DJD> on Glucosamine, bioflavinoids, etc; she denies neck discomfort etc & still works out daily in the gym...    Osteoporosis> on Calcium, VitD, MVI; BMD 5/13 showed severe osteoporosis w/ TScores -2.9 in Spine & -3.3 in Femur; also measured -5.2 in forearm/Radius> she was asked to start Reclast & saw TP 5/13 but couldn't make a decision; she tells me she has decided NOT to take Reclast or any other osteoporosis therapy; I carefully reviewed the serious consequences of her decision & offered her second opinions w/ GYN, Endocrine, Rheum, whatever but she declines; I further offered to explain to her son but she also declined this intervention...    Hx stroke & cerebral atrophy> on ASA81 alone now as the Plavix was stopped 5/14 due to GIB; she had right occipital infarct 9/10; she is followed by Neuro, Leonie Man- note 10/13 & 12/13 are reviewed> MMSE 24/30, mult somatic complaints, anxious & wanted MRI since several relatives w/ brain tumors- MRI 11/13 showed chr microvasc ischemic dis & generalized cerebral atrophy, no acute ischemia, no lesions seen; EEG 11/13  was neg- no seizure activity; she claimed Plavix caused memory loss but DrSethi set her straight and recommended ASA81, strict BP control, fish oil, work puzzles, etc...    Anxiety> on Xanax0.25 prn; she'll take 1/2 tab prn & notes that it helps... We reviewed prob list, meds, xrays and labs> see below for updates >>   LABS 5/14:  Chems- wnl;  CBC- improved w/ Hg=12.4, Fe=77 (25%sat)...   ~  September 11, 2012:  2mo ROV & last OV we added Vyacog- one tab daily but she forgot & never filled the Rx (re-written today); she wonders about DHA for the brain & she will ask DrSethi at her next visit "he said I have clogged arteries" so she is worried about being off the Plavix (see above- GI bleed w/ Hg down to 6.5 from Mallory-Weiss tear on EGD)...     She is concerned about her ankles swelling- noted late in the day, & they always go down by the next morn; we discussed Ven Insuffic & the need for Sodium restriction, elevation, support hose, but she wants a Diuretic- decided to decr Norvasc to 5mg /d 7 add Lasix20 Qam prn swelling...     Family wants her Chol checked she says & FLP today revealed TChol 213, TG 77, HDL 77, LDL 123 and she continues to state her refusal of cholesterol meds... We reviewed prob list, meds, xrays and labs> see below for updates >>   LABS 6/14:  FLP- not quite at goals on diet alone but she does not want Statin Rx...  ~  October 01, 2013:  54yr ROV & [name] is here w/ her son from [place]; she has been splitting her time betw his home & hers here in [place]; she also has a son who lives in [place]...     Her CC today is Abd discomfort, poor historian, vague cramping pain on & off, hx of mult somatic complaints; GI hx (DrPatterson, DrGessner) includes severe divertics w/ narrowed sigmoid, hx n/v with GI bleed  from Mallory-Weiss tear; she is not taking any meds for these problems- prev on PPI & stopped on her own, prev asked to take fiber/ Metamucil but stopped this as well; we discussed  restarting PROTONIX40 Qam before breakfast, and BENTYL 10mg  tid as needed for the cramping pain; needs to incr fiber in diet and take METAMUCIL daily- discussed this w/ son who took notes and was provided the AVS...    She has lost weight assoc w/ worsening dementia; wt is 93# today (BMI<18) down 8# over the last yr; she states appetite is ok & claims she eats well; son seems to agree; asked to eat slowly, consume full amt at all 3 meals and start nutritional supplement like ENSURE Bid betw meals...    She has maintained regular f/u w/ Neuro- DrSethi=>DrLam; last seen 3/15 w/ MMSE 16/30 and son told she cannot be alone, no driving, needs med supervision, etc; she is supposed to be taking Aricept10, NamendaXR28, and Plavix75; they did not bring med bottles or list to today's OV...     Finally [name] nots night sweats> but denies fever, chills, cough, sputum, etc; similarly denies CP, palpit, SOB, edema, etc; we discussed perhaps trying herbal rx w/ black cohosh, and we will check f/u labs... We reviewed prob list, meds, xrays and labs> see below for updates >>   LABS 7/15:  Chems- all wnl;  CBC- wnl;  TSH=1.73;  Iron=111 (36%sat);  B12>1500;  Sed=18...          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 eval by Neurology/ stroke team showed tiny right occipital infarct, diffuse intra /extracranial atherosclerotic dis (but no focal stenoses or interventionable lesions) & a part left 3rd nerve palsy as well... PLAVIX was added to her ASA therapy... ~  11/10:  f/u DrSethi stopped ASA, continued Plavix for her cerebrovasc dis... ~  11/10:  [facility]in [facility]- he predicted good prognosis for spont recovery of left eye movement. ~  1/11:  her left 3rd nerve palsy has resolved...  ALLERGIC RHINITIS (ICD-477.8) - uses OTC antihistamines infrequently...  HYPERTENSION,  MILD (ICD-401.1) - she has hx of HBP and on NORVASC 10mg /d...  ~  CXR 1/09 showed normal heart size, clear lungs, NAD... ~  12/12:  BP= 128/72 today and 130's/ 70's at home... denies HA, fatigue, CP, palipit, dizziness, syncope, dyspnea; min edema noted. ~  5-6/13:  BP= 134/70 but she is quite concerned about BPs up to 150-160 at home; we decided to add LOSARTAN 50mg /d to her meds & f/u in 6 weeks... ~  12/13: on Norvasc10, Cozaar50;  BP= 142/72 & she denies CP, palpit, SOB, edema. ~  5/14: post hosp visit on Norvasc10 & off prev Cozaar50 (she stopped this on her own- confused about meds);  BP= 110/60 & she denies CP, palpit, SOB, edema; we decided to leave the ARB off as it looks like BP adeq controlled on Amlod alone. ~  6/14: she is concerned about VI & edema late in the day; Rec to decr Norvasc to 5mg /d & added prn Lasix20 for swelling that doesn't go down overnight... Reminded to elim sodium etc... ~  7/15: on Amlodipine5, Cozaar50; BP= 110/60 & she denies weakness, CP, palpit, dizzy, SOB, edema...  Hx of MITRAL VALVE PROLAPSE (ICD-424.0) - baseline EKG w/ NSR, WNL;  Neg stress thallium 4/93... she  denies CP, palpit, SOB, etc... she exercises regularly walking 3-4 miles and going to the gym. ~  2DEcho 9/10 showed trivial prolapse of the post leaflet, norm LVF w/ EF= 65-70%, no regional wall motion abnormalities...  PERIPHERAL VASCULAR DISEASE (ICD-443.9) - on ASA81, PLAVIX 75mg /d... CTAbd 8/06 showed thickening of Ao wall & prob plaque;  CDopplers 7/08 showed 0-39% bilat ICA stenoses & plaque in Rt subclavian art (plus Rt Thyroid Mass). ~  MRI/ MRA Brain & Neck 9/10 showed intra & extracranial atherosclerotic changes w/o interventionable lesions & no hemodynamically signif stenoses in the neck... ~  7/15: she is off the ASA81 & still taking the Plavix75mg /d; son knows to supervise all her meds...  VENOUS INSUFFICIENCY (ICD-459.81) - she had a vein stripping yrs ago> she follows a low sodium diet  & has no edema at this time...  HYPERCHOLESTEROLEMIA, BORDERLINE (ICD-272.4) - on diet + FISH OIL, she does not want meds. ~  [place] 1/11 showed TChol 199, TG 109, HDL 66, LDL 112 ~  FLP 7/11 showed TChol 212, TG 66, HDL 69, LDL 133 ~  FLP 1/12 showed TChol 208, TG 106, HDL 66, LDL 124 ~  FLP 7/12 showed TChol 198, TG 47, HDL 75, LDL 114 ~  FLP 6/13 showed TChol 190, TG 88, HDL 61, LDL 111 ~  FLP 6/14 on diet alone showed TChol 213, TG 77, HDL 77, LDL 123... She continues to decline med rx.  GOITER, UNSPECIFIED (ICD-240.9) - Old CXR's show deviation of trachea to left by thyroid & CDoppler showed right thyroid mass... she has no local symptoms, swallowing difficulty & is clinically euthyroid...  ~  labs 6/09 showed TSH= 1.04 ~  labs 6/10 showed TSH= 0.98 ~  labs 7/11 showed TSH= 1.40 ~  labs 7/12 showed TSH= 1.30 ~  Labs 6/13 showed TSH= 1.29 ~  Labs 7/15 showed TSH= 1.73  GIB secondary to MALLORY-WEISS TEAR >> this occurred 5/14 after N/V of ?reason => hemetemesis & blood in stool => assoc w/ weakness & lightheadedness with an initial Hg=9.2 that dropped to 6.5; Aspirin & Plavix were held (she wanted to blame the Plavix for the bleeding); EGD by DrGessner showed a Mallory-Weiss tear in upper stomach & she was placed on PPI, transfused 1u, started on Iron & Hg improved to 8.8 by discharge=> subsequently back to normal...  DIVERTICULOSIS OF COLON (ICD-562.10) & HEMORRHOIDS (ICD-455.6) - Hx of very tortuous & redundant colon w/ severe sigmoid diverticulosis;  last colonoscopy w/ pediatric scope 10/98 was difficult & subseq barium enema showed severe divertics otherwise neg;  Rx fiber, anusol, etc...  CTAbd 1/03 w/ divertics otherwise neg;  she is reminded to take the MIRALAX/ Metamucil regularly. ~  12/14: she saw Amy c/o abd pain & BRB in stool> note reviewed, exam showed hem & heme neg stool; CT Abd&Pelvis 12/14 showed severe sigmoid divertics, s/p GB & Hyst, left hip prosthesis, old L1 compression  fx & lumbar spondylosis...  UNSPECIFIED CYSTITIS (ICD-595.9) - Urology eval 8/09 by DrDalstadt...  OSTEOARTHRITIS (ICD-715.90) - DJD in both hips L>Rt w/ LTHR done 2/09 by DrAlusio... she takes Glucosamine, Calcium, MVI, VitC & VitD. ~  8/11: c/o neck pain & CSpine films by DrSethi showed DDD, 36mm anterolisthesis C4 on C5, osteopenia... ~  2/12:  c/o continued neck discomfort ?eval by neurosurg she said?, advised rest, heat, Tramadol Prn.   OSTEOPOROSIS (ICD-733.00) - BMD by DrNeal 7/02 showed TScores -2.3 to -3.2;  regular f/u BMD from DrNeal - last 8/08  w/ TScores -2.6 to -3.2;  Vit D level checked by GYN in past & was 53 here 6/09... ~  12/09:  she reports that she stopped her Fosamax after 12 yrs Rx per DrNeal & he has rec Reclast but she is undecided & inclined to wait til next BMD and see if there has been any deterioration in measured bone density... ~  BMD 5/13:  Severe osteoporosis w/ TScores -2.9 in Spine & -3.3 in Femur; also measured -5.2 in forearm/Radius... rec to start Reclast yearly... ~  5/13:  She has declined Reclast & all other osteoporosis interventions offered to her; she has similarly declined referral for 2nd opinions etc... ~  12/13: we reviewed prev data & again offered Reclast rx, & referrals for 2nd opinions; she declines all interventions...  CVA (ICD-434.91) - Hosp 9/10 w/ blurry vision & exam showing mild field cut & left eye strabismus... eval by Neurology/ stroke team showed tiny right occipital infarct, diffuse intra /extracranial atherosclerotic dis (but no focal stenoses or interventionable lesions) & a part left 3rd nerve palsy as well... PLAVIX was added to her ASA therapy... SEE DC SUMMARY  CEREBRAL ATROPHY (ICD-331.9) - CTBrain 10/07 showed mild atrophy, otherw neg... she denies memory problems- with minor changes on MMSE testing... 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 ~  6/10: when asked about her memory she states "it depends on  what day it is" ~  1/11:  I see signs of mild deterioration in memory but she denies- eg. she couldn't recall what DrTMartin told her at [facility] about her 3rd nerve paresis, and she insisted that her mother lived to 81, then later corrected herself (she lived to 57). ~  2/12: similar signs of memory impairment w/ her c/o blurry vision & neck pain> she couldn't recall prev w/u by neuro or results. ~  F/u Neuro(Sethi)- note 8/13, 10/13, & 12/13 from NP & DrSethi> MMSE 24/30, mult somatic complaints, anxious & wanted MRI since several relatives w/ brain tumors- MRI 11/13 showed chr microvasc ischemic dis & generalized cerebral atrophy, no acute ischemia, no lesions seen... ~  She has had freq f/u visits w/ Neuro, DrLam (8/14, 9/14, 12/14 & 3/15=> notes freq calls for abd pain & "clogged art in my head"; progressive Alz dementia w/ MMSE 16/30; she should not be alone & shouldn't drive; supposed to be on Aricept10 & Namenda28 along w/ her Plavix75 (hx right occipital infarct) but needs med supervision; indicates son is moving her to his home in [place]...   ANXIETY (ICD-300.00) - she has severe stress having found her son at home after a suicide... she has ALPRAZOLAM for Prn use but doesn't take it... she was rec to try Zoloft per Neuro but she never filled it.  ANEMIA related to GIB 5/14 from M-W tear >> ~  Previous CBCs showed Hg= 12.8 - 14.2 ~  Adm 5/14 w/ GIB and Hg nadir = 6.5.Marland KitchenMarland Kitchen Tx 1u & placed on Fe w/ improvement to 8.8 by disch... ~  Labs 5/14 in office follow up showed Hg= 12.4, Fe= 77 (25%sat)...  ~  Labs 12/14 showed Hg= 14.0 ~  Labs 7/15 showed Hg= 14.0   Past Surgical History  Procedure Laterality Date  . Breast lumpectomy  1960's    benign breast biopsy  . Cholecystectomy  1980    at [facility]  Marland Kitchen Appendectomy  1980    at [facility].  . Abdominal hysterectomy  07/1980  hyst and rectocele repair by Dr. Mallie Mussel  . Oculoplastic eye surgery  12/1997    in [place]  . Vv stripping years  ago    . Esophagogastroduodenoscopy N/A 07/29/2012    Procedure: ESOPHAGOGASTRODUODENOSCOPY (EGD);  Surgeon: Gatha Mayer, MD;  Location: [facility];  Service: Endoscopy;  Laterality: N/A;  . Colonoscopy  1998    Outpatient Encounter Prescriptions as of 10/01/2013  Medication Sig  . ALPRAZolam (XANAX) 0.25 MG tablet Take 1 tablet (0.25 mg total) by mouth 2 (two) times daily as needed for anxiety.  Marland Kitchen amLODipine (NORVASC) 5 MG tablet TAKE 1 TABLET BY MOUTH EVERY DAY  . clopidogrel (PLAVIX) 75 MG tablet Take 1 tablet (75 mg total) by mouth daily.  Marland Kitchen donepezil (ARICEPT) 10 MG tablet Take 10 mg by mouth at bedtime.   Marland Kitchen losartan (COZAAR) 50 MG tablet Take 1 tablet (50 mg total) by mouth daily.  . Multiple Vitamin (MULTIVITAMIN PO) Take 1 tablet by mouth daily.    Marland Kitchen NAMENDA XR 28 MG CP24 Take 28 mg by mouth daily.    Allergies  Allergen Reactions  . Augmentin [Amoxicillin-Pot Clavulanate]   . Septra [Sulfamethoxazole-Tmp Ds]   . Shellfish Allergy   . Sulfamethoxazole-Trimethoprim Other (See Comments)    allergy to Sulfa w/ flu-like illness.  . Amoxicillin-Pot Clavulanate Other (See Comments)     "fuzzy-headed"    Review of Systems        See HPI - all other systems neg except as noted... The patient denies anorexia, fever, weight loss, weight gain, vision loss, decreased hearing, hoarseness, chest pain, syncope, dyspnea on exertion, peripheral edema, prolonged cough, headaches, hemoptysis, abdominal pain, melena, hematochezia, severe indigestion/heartburn, hematuria, incontinence, muscle weakness, suspicious skin lesions, transient blindness, difficulty walking, depression, unusual weight change, abnormal bleeding, enlarged lymph nodes, and angioedema.    Objective:   Physical Exam     WD,Thin, 80 y/o WF in NAD... she is moderately anxious today... GENERAL:  Alert & oriented; pleasant & cooperative... HEENT:  [place]/AT, EOM- full & WNL, EACs-clear, TMs-wnl, NOSE-pale w/ clear discharge,  THROAT-clear & WNL. NECK:  Supple w/ fairROM; no JVD; normal carotid impulses w/o bruits; palp thyroid w/o change, no lymphadenopathy. CHEST:  Decr BS bilat, clear- no wheezes/ rales/ rhonchi... HEART:  Regular Rhythm; without murmurs/ rubs/ or gallops heard... ABDOMEN:  Soft & nontender; normal bowel sounds; no organomegaly or masses detected. EXT: without deformities, mild arthritic changes; no varicose veins/ +venous insuffic/ no edema. NEURO: CN's intact & no other neuro deficits... some decr ROM neck. SKIN:  neg- w/o lesions...  RADIOLOGY DATA:  Reviewed in the EPIC EMR & discussed w/ the patient...  LABORATORY DATA:  Reviewed in the EPIC EMR & discussed w/ the patient...   Assessment & Plan:    HBP>  BP controlled on Amlodipine & Losartan50; continue same...  Periph Vasc Dis/ Hx of STROKE>  On Plavix daily per Neuro- DrSethi, DrLam, no acute cerebral ischemic symptoms...  CHOL>  FLP not at goals but she has repeatedly refused to take chol meds...  GOITER>  Denies compressive symptoms or hyper/hypo symptoms; chemically euthyroid as well & following...  GIB related to M-W tear while on ASA/ Plavix; meds held, Tx 1u, placed on ProtonixBid & Fe w/ improvement; she subseq stopped the PPI, Fe etc=> asked to restart Protonix40Qam...  DIVERTICULOSIS>  Known severe sigm divertics & reminded to take Metamucil/ Miralax regularly... We discussed Simethacone for gas symptoms & BENTYL10 prn cramping pain...  DJD>  She has OTC  meds- Tylenol/ Advil etc for prn use...  Osteoporosis>  She gets BMDs from DrNeal, Gyn & was prev on Fosamax; this was stopped after 10+ yrs rx & DrNeal has rec reclast but she has refused rx.  MEMORY LOSS>  She has atrophy on scans & progressive changes on MMSE; she initially declined Aricept & tried OTC memory supplements=> Rec Vyacog; now on Aricept & Namenda per Neuro...  ANXIETY>  As noted she feels that the [place] caused trouble thinking but does not want  substitute medication for nerves...   Patient's Medications  New Prescriptions   DICYCLOMINE (BENTYL) 10 MG CAPSULE    Take 1 capsule (10 mg total) by mouth 3 (three) times daily as needed for spasms.   PANTOPRAZOLE (PROTONIX) 40 MG TABLET    Take 1 tablet (40 mg total) by mouth daily. Take 30 minutes before breakfast  Previous Medications   ALPRAZOLAM (XANAX) 0.25 MG TABLET    Take 1 tablet (0.25 mg total) by mouth 2 (two) times daily as needed for anxiety.   AMLODIPINE (NORVASC) 5 MG TABLET    TAKE 1 TABLET BY MOUTH EVERY DAY   CLOPIDOGREL (PLAVIX) 75 MG TABLET    Take 1 tablet (75 mg total) by mouth daily.   DONEPEZIL (ARICEPT) 10 MG TABLET    Take 10 mg by mouth at bedtime.    LOSARTAN (COZAAR) 50 MG TABLET    Take 1 tablet (50 mg total) by mouth daily.   MULTIPLE VITAMIN (MULTIVITAMIN PO)    Take 1 tablet by mouth daily.     NAMENDA XR 28 MG CP24    Take 28 mg by mouth daily.  Modified Medications   No medications on file  Discontinued Medications   No medications on file

## 2013-10-02 ENCOUNTER — Ambulatory Visit: Payer: Medicare Other | Admitting: Pulmonary Disease

## 2013-10-14 ENCOUNTER — Telehealth: Payer: Self-pay | Admitting: Pulmonary Disease

## 2013-10-14 NOTE — Telephone Encounter (Signed)
Called and spoke with pt and she is aware of lab results per SN. Pt voiced her understanding and nothing further is needed 

## 2013-10-15 ENCOUNTER — Telehealth: Payer: Self-pay | Admitting: Internal Medicine

## 2013-10-15 NOTE — Telephone Encounter (Signed)
Spoke with patient. She has an "uncomfortable feeling" in her stomach that she feels she should get "checked out". She specifically denies BM changes, nausea or vomiting. Appointment with Amy E, whom she has seen in the past

## 2013-10-18 ENCOUNTER — Encounter: Payer: Self-pay | Admitting: Physician Assistant

## 2013-10-18 ENCOUNTER — Ambulatory Visit (INDEPENDENT_AMBULATORY_CARE_PROVIDER_SITE_OTHER): Payer: Medicare Other | Admitting: Physician Assistant

## 2013-10-18 VITALS — BP 106/60 | HR 68 | Ht 61.0 in | Wt 92.2 lb

## 2013-10-18 DIAGNOSIS — R197 Diarrhea, unspecified: Secondary | ICD-10-CM | POA: Diagnosis not present

## 2013-10-18 DIAGNOSIS — I63219 Cerebral infarction due to unspecified occlusion or stenosis of unspecified vertebral arteries: Secondary | ICD-10-CM

## 2013-10-18 DIAGNOSIS — K5730 Diverticulosis of large intestine without perforation or abscess without bleeding: Secondary | ICD-10-CM | POA: Diagnosis not present

## 2013-10-18 NOTE — Patient Instructions (Signed)
You can take Pepto Bismol or Imodium over the counter as needed for diarrhea. Take Bentyl ( dicyclomine)  Twice daily over the next week as needed for cramping and diarrhea.  We have given you a Low Fiber/Roughage diet to follow for the next few days over the weekend.

## 2013-10-18 NOTE — Progress Notes (Signed)
Subjective:    Patient ID: [name], female    DOB: 03/27/1925, 80 y.o.   MRN: [id number]  HPI  [name] is a pleasant 80 year old white female known to Dr. Carlean Purl. She has history of severe diverticular disease, mild dementia, hypertension, peripheral vascular disease, prior CVA, and status post cholecystectomy. She had undergone EGD in 2014 with complaints of hematemesis and was found to have a Mallory-Weiss tear. Colonoscopy last done by Dr. Sharlett Iles in 1998. She was noted to have severe sigmoid diverticulosis with multiple large mouth diverticuli spasm and tortuosity exam was unable to be completed. Patient comes in today with a two-day history of diarrhea and a vague discomfort in her abdomen. She had been seen by myself in December of 2014 with similar symptoms. She had undergone CT scan of the abdomen and pelvis at that time which showed severe diverticulosis but no diverticulitis and was otherwise unremarkable. She currently denies any abdominal pain ,no nausea or vomiting, no fever or chills, no melena or hematochezia. She says she had 3 bowel movements yesterday- all loose and 2 loose bowel movements  today. No new medications, no recent antibiotics etc. She apparently has not been taking the dicyclomine which had been previously prescribed- did take Pepto-Bismol which he found helpful. No known infectious exposures etc.    Review of Systems  Constitutional: Negative.   HENT: Negative.   Eyes: Negative.   Respiratory: Negative.   Cardiovascular: Negative.   Gastrointestinal: Positive for diarrhea.  Endocrine: Negative.   Genitourinary: Negative.   Musculoskeletal: Negative.   Skin: Negative.   Allergic/Immunologic: Negative.   Neurological: Negative.   Hematological: Negative.   Psychiatric/Behavioral: Negative.    Outpatient Prescriptions Prior to Visit  Medication Sig Dispense Refill  . ALPRAZolam (XANAX) 0.25 MG tablet Take 1 tablet (0.25 mg total) by mouth 2  (two) times daily as needed for anxiety.  60 tablet  1  . amLODipine (NORVASC) 5 MG tablet TAKE 1 TABLET BY MOUTH EVERY DAY  30 tablet  3  . clopidogrel (PLAVIX) 75 MG tablet Take 1 tablet (75 mg total) by mouth daily.  90 tablet  3  . dicyclomine (BENTYL) 10 MG capsule Take 1 capsule (10 mg total) by mouth 3 (three) times daily as needed for spasms.  90 capsule  5  . donepezil (ARICEPT) 10 MG tablet Take 10 mg by mouth at bedtime.       Marland Kitchen losartan (COZAAR) 50 MG tablet Take 1 tablet (50 mg total) by mouth daily.  30 tablet  6  . Multiple Vitamin (MULTIVITAMIN PO) Take 1 tablet by mouth daily.        Marland Kitchen NAMENDA XR 28 MG CP24 Take 28 mg by mouth daily.  30 capsule  11  . pantoprazole (PROTONIX) 40 MG tablet Take 1 tablet (40 mg total) by mouth daily. Take 30 minutes before breakfast  30 tablet  5   No facility-administered medications prior to visit.   Allergies  Allergen Reactions  . Augmentin [Amoxicillin-Pot Clavulanate]   . Septra [Sulfamethoxazole-Tmp Ds]   . Shellfish Allergy   . Sulfamethoxazole-Trimethoprim Other (See Comments)    allergy to Sulfa w/ flu-like illness.  . Amoxicillin-Pot Clavulanate Other (See Comments)     "fuzzy-headed"   Patient Active Problem List   Diagnosis Date Noted  . Moderate dementia without behavioral disturbance 10/31/2012  . Mallory-Weiss tear with bleeding 07/29/2012  . Acute blood loss anemia 07/28/2012  . Cerumen impaction 12/15/2011  . Memory impairment 12/02/2011  .  NECK PAIN 05/18/2010  . HYPERCHOLESTEROLEMIA, BORDERLINE 10/06/2009  . ANXIETY 03/30/2009  . PARALYTIC STRAB THIRD/OCULOMOTR NERVE PALSY PART 12/30/2008  . HYPERTENSION, MILD 12/30/2008  . CVA 12/02/2008  . BLURRED VISION 11/28/2008  . UNSPECIFIED CYSTITIS 03/25/2008  . GOITER, UNSPECIFIED 02/27/2007  . CEREBRAL ATROPHY 02/27/2007  . MITRAL VALVE PROLAPSE 02/27/2007  . PERIPHERAL VASCULAR DISEASE 02/27/2007  . HEMORRHOIDS 02/27/2007  . VENOUS INSUFFICIENCY 02/27/2007  .  ALLERGIC RHINITIS DUE TO OTHER ALLERGEN 02/27/2007  . DIVERTICULOSIS OF COLON 02/27/2007  . OSTEOARTHRITIS 02/27/2007  . OSTEOPOROSIS 02/27/2007   History  Substance Use Topics  . Smoking status: Never Smoker   . Smokeless tobacco: Never Used  . Alcohol Use: No   family history is not on file.     Objective:   Physical Exam  well-developed thin frail-appearing elderly white female in no acute distress, accompanied by her son blood pressure 106/60 pulse 68 height 5 foot 1 weight 92. HEENT; nontraumatic normocephalic EOMI PERRLA sclera anicteric, Supple ;no JVD, Cardiovascular; regular rate and rhythm with S1-S2 no murmur or gallop, Pulmonary; clear bilaterally, Abdomen; flat soft nontender nondistended bowel sounds are active there is no palpable mass or hepatosplenomegaly bowel sounds are present, Rectal; exam not done, Extremities ;no clubbing cyanosis or edema skin warm and dry, Psych ;mood and affect appropriate        Assessment & Plan:  #68  80 year old white female with 2 day history of mild diarrhea, no evidence of diverticulitis by exam. She may have some IBS symptoms were perhaps a mild gastroenteritis. #2 history of severe diverticulosis #3 history of CVA #4 history of dementia #5 status post cholecystectomy #6 history Mallory-Weiss tear 2014  Plan; Observe for now low residue diet over the next couple of days. Patient and son were told she could use Imodium over-the-counter or Pepto-Bismol as needed for diarrhea. She was asked to restart Bentyl 10 mg by mouth twice daily over the next week or so and then use as needed thereafter She will call back next week if symptoms are persisting and at that point may want to do pathogen panel or stool cultures.

## 2013-10-21 NOTE — Progress Notes (Signed)
Will forward to Dr. Carlean Purl, as it appears to be his patient.

## 2013-10-24 NOTE — Progress Notes (Signed)
Agree with Ms. Esterwood's assessment and plan. Jerrianne Hartin E. Jimi Giza, MD, FACG   

## 2013-11-14 ENCOUNTER — Telehealth: Payer: Self-pay | Admitting: Internal Medicine

## 2013-11-14 NOTE — Telephone Encounter (Signed)
Pt states she has been having problems with abdominal pain and nausea. Pt is requesting to be seen by Dr. Carlean Purl. Pt scheduled to see Dr. Carlean Purl 11/15/13@9am . Pt aware of appt.

## 2013-11-15 ENCOUNTER — Encounter: Payer: Self-pay | Admitting: Internal Medicine

## 2013-11-15 ENCOUNTER — Ambulatory Visit (HOSPITAL_COMMUNITY)
Admission: RE | Admit: 2013-11-15 | Discharge: 2013-11-15 | Disposition: A | Discharge status: Home / Self Care | Payer: Medicare Other | Source: Ambulatory Visit | Attending: Internal Medicine | Admitting: Internal Medicine

## 2013-11-15 ENCOUNTER — Other Ambulatory Visit (INDEPENDENT_AMBULATORY_CARE_PROVIDER_SITE_OTHER): Payer: Medicare Other

## 2013-11-15 ENCOUNTER — Ambulatory Visit (INDEPENDENT_AMBULATORY_CARE_PROVIDER_SITE_OTHER): Payer: Medicare Other | Admitting: Internal Medicine

## 2013-11-15 ENCOUNTER — Other Ambulatory Visit: Payer: Self-pay | Admitting: Pulmonary Disease

## 2013-11-15 ENCOUNTER — Telehealth: Payer: Self-pay | Admitting: Internal Medicine

## 2013-11-15 VITALS — BP 122/60 | HR 64 | Ht 61.0 in | Wt 92.5 lb

## 2013-11-15 DIAGNOSIS — N949 Unspecified condition associated with female genital organs and menstrual cycle: Secondary | ICD-10-CM

## 2013-11-15 DIAGNOSIS — R102 Pelvic and perineal pain: Secondary | ICD-10-CM

## 2013-11-15 DIAGNOSIS — I63219 Cerebral infarction due to unspecified occlusion or stenosis of unspecified vertebral arteries: Secondary | ICD-10-CM | POA: Diagnosis not present

## 2013-11-15 LAB — CBC WITH DIFFERENTIAL/PLATELET
BASOS ABS: 0.1 10*3/uL (ref 0.0–0.1)
Basophils Relative: 0.8 % (ref 0.0–3.0)
Eosinophils Absolute: 0.3 10*3/uL (ref 0.0–0.7)
Eosinophils Relative: 3.7 % (ref 0.0–5.0)
HEMATOCRIT: 43.2 % (ref 36.0–46.0)
Hemoglobin: 14.4 g/dL (ref 12.0–15.0)
Lymphocytes Relative: 16.8 % (ref 12.0–46.0)
Lymphs Abs: 1.3 10*3/uL (ref 0.7–4.0)
MCHC: 33.2 g/dL (ref 30.0–36.0)
MCV: 96.1 fl (ref 78.0–100.0)
MONO ABS: 0.7 10*3/uL (ref 0.1–1.0)
MONOS PCT: 10 % (ref 3.0–12.0)
NEUTROS ABS: 5.1 10*3/uL (ref 1.4–7.7)
Neutrophils Relative %: 68.7 % (ref 43.0–77.0)
PLATELETS: 245 10*3/uL (ref 150.0–400.0)
RBC: 4.49 Mil/uL (ref 3.87–5.11)
RDW: 13.5 % (ref 11.5–15.5)
WBC: 7.4 10*3/uL (ref 4.0–10.5)

## 2013-11-15 MED ORDER — ALPRAZOLAM 0.25 MG PO TABS: ORAL_TABLET | ORAL | Status: AC

## 2013-11-15 NOTE — Progress Notes (Signed)
   Subjective:    Patient ID: [name], female    DOB: Aug 08, 1924, 80 y.o.   MRN: [id number]  HPI The patient is here with son after travelling back to [place] from a visit to son in [place]. She still has some lower abdominal and pelvic discomfort. Did have a dark stool the other day. No red blood. No back pain or urinary sxs. Had seen Carol Esterwood, PA-C in   [place] Readings from Last 3 Encounters:  11/15/13 92 lb 8 oz (41.958 kg)  10/18/13 92 lb 3.2 oz (41.822 kg)  10/01/13 92 lb 12.8 oz (42.094 kg)  Medications, allergies, past medical history, past surgical history, family history and social history are reviewed and updated in the EMR.   Review of Systems As above, mildly demented    Objective:   Physical Exam Thin elderly ww NAD Abdomen is soft with mild RLQ/LLA and pelvic tenderness Ext no edema  Rectal exam with [name], CMA present.  Gr III RA internal hemorrhoid, NL tone, no mass, soft brown stool     Assessment & Plan:  Prolapsed internal hemorrhoids, grade 3 Observe - asymptomatic  Female pelvic pain I doubt aything bad here CT ok late 2014 She is worried Check Pelvic US - s/p hysty but not clear if ovaries remain   It was ok  F/U PRN

## 2013-11-15 NOTE — Telephone Encounter (Signed)
Patient is calling for ultrasound results. Please, advise.

## 2013-11-15 NOTE — Patient Instructions (Signed)
Your physician has requested that you go to the basement for the following lab work before leaving today: CBC/diff  You have been scheduled for an abdominal ultrasound at [facility] (1st floor of hospital) on 11/15/13 now.. Please arrive 15 minutes prior to your appointment for registration. Make certain not to have anything to eat or drink 6 hours prior to your appointment. Should you need to reschedule your appointment, please contact radiology at [phone]. This test typically takes about 30 minutes to perform.   I appreciate the opportunity to care for you.

## 2013-11-15 NOTE — Assessment & Plan Note (Signed)
Observe   asymptomatic

## 2013-11-15 NOTE — Telephone Encounter (Signed)
Patient notified of results.

## 2013-11-15 NOTE — Telephone Encounter (Signed)
Al ok

## 2013-11-17 DIAGNOSIS — R102 Pelvic and perineal pain: Secondary | ICD-10-CM | POA: Insufficient documentation

## 2013-11-17 NOTE — Assessment & Plan Note (Signed)
I doubt aything bad here CT ok late 2014 She is worried Check Pelvic US - s/p hysty but not clear if ovaries remain   It was ok  F/U PRN

## 2013-12-06 ENCOUNTER — Other Ambulatory Visit: Payer: Self-pay | Admitting: Nurse Practitioner

## 2013-12-06 ENCOUNTER — Other Ambulatory Visit: Payer: Self-pay | Admitting: Pulmonary Disease

## 2014-01-14 ENCOUNTER — Ambulatory Visit: Payer: Medicare Other | Admitting: Internal Medicine

## 2014-01-16 ENCOUNTER — Other Ambulatory Visit: Payer: Self-pay | Admitting: Pulmonary Disease

## 2014-02-24 ENCOUNTER — Other Ambulatory Visit: Payer: Self-pay | Admitting: Pulmonary Disease

## 2014-02-27 ENCOUNTER — Other Ambulatory Visit: Payer: Self-pay | Admitting: Pulmonary Disease

## 2014-03-04 ENCOUNTER — Other Ambulatory Visit: Payer: Self-pay | Admitting: Nurse Practitioner

## 2014-03-31 DIAGNOSIS — Z111 Encounter for screening for respiratory tuberculosis: Secondary | ICD-10-CM | POA: Diagnosis not present

## 2014-03-31 DIAGNOSIS — E780 Pure hypercholesterolemia: Secondary | ICD-10-CM | POA: Diagnosis not present

## 2014-03-31 DIAGNOSIS — K219 Gastro-esophageal reflux disease without esophagitis: Secondary | ICD-10-CM | POA: Diagnosis not present

## 2014-03-31 DIAGNOSIS — Z23 Encounter for immunization: Secondary | ICD-10-CM | POA: Diagnosis not present

## 2014-03-31 DIAGNOSIS — I739 Peripheral vascular disease, unspecified: Secondary | ICD-10-CM | POA: Diagnosis not present

## 2014-03-31 DIAGNOSIS — I119 Hypertensive heart disease without heart failure: Secondary | ICD-10-CM | POA: Diagnosis not present

## 2014-04-02 DIAGNOSIS — Z23 Encounter for immunization: Secondary | ICD-10-CM | POA: Diagnosis not present

## 2014-04-27 DIAGNOSIS — K2970 Gastritis, unspecified, without bleeding: Secondary | ICD-10-CM | POA: Diagnosis not present

## 2014-04-27 DIAGNOSIS — R109 Unspecified abdominal pain: Secondary | ICD-10-CM | POA: Diagnosis not present

## 2014-04-27 DIAGNOSIS — R10814 Left lower quadrant abdominal tenderness: Secondary | ICD-10-CM | POA: Diagnosis not present

## 2014-04-27 DIAGNOSIS — R55 Syncope and collapse: Secondary | ICD-10-CM | POA: Diagnosis not present

## 2014-04-27 DIAGNOSIS — I10 Essential (primary) hypertension: Secondary | ICD-10-CM | POA: Diagnosis not present

## 2014-04-29 DIAGNOSIS — I119 Hypertensive heart disease without heart failure: Secondary | ICD-10-CM | POA: Diagnosis not present

## 2014-04-29 DIAGNOSIS — R55 Syncope and collapse: Secondary | ICD-10-CM | POA: Diagnosis not present

## 2014-05-28 DIAGNOSIS — R531 Weakness: Secondary | ICD-10-CM | POA: Diagnosis not present

## 2014-05-28 DIAGNOSIS — M1811 Unilateral primary osteoarthritis of first carpometacarpal joint, right hand: Secondary | ICD-10-CM | POA: Diagnosis not present

## 2014-05-28 DIAGNOSIS — Z7902 Long term (current) use of antithrombotics/antiplatelets: Secondary | ICD-10-CM | POA: Diagnosis not present

## 2014-05-28 DIAGNOSIS — R40241 Glasgow coma scale score 13-15: Secondary | ICD-10-CM | POA: Diagnosis not present

## 2014-05-28 DIAGNOSIS — R5381 Other malaise: Secondary | ICD-10-CM | POA: Diagnosis not present

## 2014-05-28 DIAGNOSIS — J449 Chronic obstructive pulmonary disease, unspecified: Secondary | ICD-10-CM | POA: Diagnosis not present

## 2014-05-28 DIAGNOSIS — R5383 Other fatigue: Secondary | ICD-10-CM | POA: Diagnosis not present

## 2014-05-29 DIAGNOSIS — R404 Transient alteration of awareness: Secondary | ICD-10-CM | POA: Diagnosis not present

## 2014-05-29 DIAGNOSIS — R270 Ataxia, unspecified: Secondary | ICD-10-CM | POA: Diagnosis not present

## 2014-05-30 DIAGNOSIS — R7881 Bacteremia: Secondary | ICD-10-CM | POA: Diagnosis not present

## 2014-05-30 DIAGNOSIS — E876 Hypokalemia: Secondary | ICD-10-CM | POA: Diagnosis not present

## 2014-05-30 DIAGNOSIS — R270 Ataxia, unspecified: Secondary | ICD-10-CM | POA: Diagnosis not present

## 2014-05-30 DIAGNOSIS — J0140 Acute pansinusitis, unspecified: Secondary | ICD-10-CM | POA: Diagnosis not present

## 2014-05-30 DIAGNOSIS — F0281 Dementia in other diseases classified elsewhere with behavioral disturbance: Secondary | ICD-10-CM | POA: Diagnosis not present

## 2014-05-30 DIAGNOSIS — R410 Disorientation, unspecified: Secondary | ICD-10-CM | POA: Diagnosis not present

## 2014-06-02 DIAGNOSIS — M25539 Pain in unspecified wrist: Secondary | ICD-10-CM | POA: Diagnosis not present

## 2014-06-02 DIAGNOSIS — M79641 Pain in right hand: Secondary | ICD-10-CM | POA: Diagnosis not present

## 2014-06-04 DIAGNOSIS — S0993XA Unspecified injury of face, initial encounter: Secondary | ICD-10-CM | POA: Diagnosis not present

## 2014-06-17 DIAGNOSIS — M79674 Pain in right toe(s): Secondary | ICD-10-CM | POA: Diagnosis not present

## 2014-06-17 DIAGNOSIS — B351 Tinea unguium: Secondary | ICD-10-CM | POA: Diagnosis not present

## 2014-06-17 DIAGNOSIS — M79675 Pain in left toe(s): Secondary | ICD-10-CM | POA: Diagnosis not present

## 2014-06-17 DIAGNOSIS — R262 Difficulty in walking, not elsewhere classified: Secondary | ICD-10-CM | POA: Diagnosis not present

## 2014-06-26 DIAGNOSIS — R0989 Other specified symptoms and signs involving the circulatory and respiratory systems: Secondary | ICD-10-CM | POA: Diagnosis not present

## 2014-06-27 DIAGNOSIS — E876 Hypokalemia: Secondary | ICD-10-CM | POA: Diagnosis not present

## 2014-06-27 DIAGNOSIS — D649 Anemia, unspecified: Secondary | ICD-10-CM | POA: Diagnosis not present

## 2014-06-30 DIAGNOSIS — M159 Polyosteoarthritis, unspecified: Secondary | ICD-10-CM | POA: Diagnosis not present

## 2014-06-30 DIAGNOSIS — R911 Solitary pulmonary nodule: Secondary | ICD-10-CM | POA: Diagnosis not present

## 2014-06-30 DIAGNOSIS — J069 Acute upper respiratory infection, unspecified: Secondary | ICD-10-CM | POA: Diagnosis not present

## 2014-07-01 DIAGNOSIS — N390 Urinary tract infection, site not specified: Secondary | ICD-10-CM | POA: Diagnosis not present

## 2014-08-04 DIAGNOSIS — H2511 Age-related nuclear cataract, right eye: Secondary | ICD-10-CM | POA: Diagnosis not present

## 2014-08-04 DIAGNOSIS — Z961 Presence of intraocular lens: Secondary | ICD-10-CM | POA: Diagnosis not present

## 2014-09-29 DIAGNOSIS — N390 Urinary tract infection, site not specified: Secondary | ICD-10-CM | POA: Diagnosis not present

## 2014-10-02 DIAGNOSIS — R262 Difficulty in walking, not elsewhere classified: Secondary | ICD-10-CM | POA: Diagnosis not present

## 2014-10-02 DIAGNOSIS — M79671 Pain in right foot: Secondary | ICD-10-CM | POA: Diagnosis not present

## 2014-10-02 DIAGNOSIS — B351 Tinea unguium: Secondary | ICD-10-CM | POA: Diagnosis not present

## 2014-10-02 DIAGNOSIS — M79672 Pain in left foot: Secondary | ICD-10-CM | POA: Diagnosis not present

## 2014-10-28 DIAGNOSIS — M159 Polyosteoarthritis, unspecified: Secondary | ICD-10-CM | POA: Diagnosis not present

## 2014-10-28 DIAGNOSIS — N390 Urinary tract infection, site not specified: Secondary | ICD-10-CM | POA: Diagnosis not present

## 2014-10-28 DIAGNOSIS — F0151 Vascular dementia with behavioral disturbance: Secondary | ICD-10-CM | POA: Diagnosis not present

## 2014-10-28 DIAGNOSIS — K219 Gastro-esophageal reflux disease without esophagitis: Secondary | ICD-10-CM | POA: Diagnosis not present

## 2014-10-28 DIAGNOSIS — Z0000 Encounter for general adult medical examination without abnormal findings: Secondary | ICD-10-CM | POA: Diagnosis not present

## 2014-10-28 DIAGNOSIS — I739 Peripheral vascular disease, unspecified: Secondary | ICD-10-CM | POA: Diagnosis not present

## 2014-12-11 DIAGNOSIS — N3001 Acute cystitis with hematuria: Secondary | ICD-10-CM | POA: Diagnosis not present

## 2014-12-11 DIAGNOSIS — B3781 Candidal esophagitis: Secondary | ICD-10-CM | POA: Diagnosis not present

## 2014-12-11 DIAGNOSIS — B370 Candidal stomatitis: Secondary | ICD-10-CM | POA: Diagnosis not present

## 2014-12-30 DIAGNOSIS — B351 Tinea unguium: Secondary | ICD-10-CM | POA: Diagnosis not present

## 2014-12-30 DIAGNOSIS — M79672 Pain in left foot: Secondary | ICD-10-CM | POA: Diagnosis not present

## 2014-12-30 DIAGNOSIS — M79671 Pain in right foot: Secondary | ICD-10-CM | POA: Diagnosis not present

## 2015-01-15 DIAGNOSIS — L2389 Allergic contact dermatitis due to other agents: Secondary | ICD-10-CM | POA: Diagnosis not present

## 2015-01-27 DIAGNOSIS — Z23 Encounter for immunization: Secondary | ICD-10-CM | POA: Diagnosis not present

## 2015-02-03 DIAGNOSIS — L2389 Allergic contact dermatitis due to other agents: Secondary | ICD-10-CM | POA: Diagnosis not present

## 2015-02-12 DIAGNOSIS — M25422 Effusion, left elbow: Secondary | ICD-10-CM | POA: Diagnosis not present

## 2015-02-12 DIAGNOSIS — M25522 Pain in left elbow: Secondary | ICD-10-CM | POA: Diagnosis not present

## 2015-02-18 DIAGNOSIS — M25522 Pain in left elbow: Secondary | ICD-10-CM | POA: Diagnosis not present

## 2015-02-18 DIAGNOSIS — M25422 Effusion, left elbow: Secondary | ICD-10-CM | POA: Diagnosis not present

## 2015-02-26 DIAGNOSIS — R339 Retention of urine, unspecified: Secondary | ICD-10-CM | POA: Diagnosis not present

## 2015-02-26 DIAGNOSIS — R39198 Other difficulties with micturition: Secondary | ICD-10-CM | POA: Diagnosis not present

## 2015-02-26 DIAGNOSIS — R8290 Unspecified abnormal findings in urine: Secondary | ICD-10-CM | POA: Diagnosis not present

## 2015-02-26 DIAGNOSIS — F0390 Unspecified dementia without behavioral disturbance: Secondary | ICD-10-CM | POA: Diagnosis not present

## 2015-02-26 DIAGNOSIS — K219 Gastro-esophageal reflux disease without esophagitis: Secondary | ICD-10-CM | POA: Diagnosis not present

## 2015-02-26 DIAGNOSIS — Z79899 Other long term (current) drug therapy: Secondary | ICD-10-CM | POA: Diagnosis not present

## 2015-02-26 DIAGNOSIS — I10 Essential (primary) hypertension: Secondary | ICD-10-CM | POA: Diagnosis not present

## 2015-02-26 DIAGNOSIS — K5660 Unspecified intestinal obstruction: Secondary | ICD-10-CM | POA: Diagnosis not present

## 2015-03-04 DIAGNOSIS — N390 Urinary tract infection, site not specified: Secondary | ICD-10-CM | POA: Diagnosis not present

## 2015-03-04 DIAGNOSIS — R34 Anuria and oliguria: Secondary | ICD-10-CM | POA: Diagnosis not present

## 2015-03-04 DIAGNOSIS — S90222A Contusion of left lesser toe(s) with damage to nail, initial encounter: Secondary | ICD-10-CM | POA: Diagnosis not present

## 2015-03-04 DIAGNOSIS — M79672 Pain in left foot: Secondary | ICD-10-CM | POA: Diagnosis not present

## 2015-03-04 DIAGNOSIS — F0151 Vascular dementia with behavioral disturbance: Secondary | ICD-10-CM | POA: Diagnosis not present

## 2015-03-10 DIAGNOSIS — M79671 Pain in right foot: Secondary | ICD-10-CM | POA: Diagnosis not present

## 2015-03-10 DIAGNOSIS — M25571 Pain in right ankle and joints of right foot: Secondary | ICD-10-CM | POA: Diagnosis not present

## 2015-03-16 DIAGNOSIS — N3281 Overactive bladder: Secondary | ICD-10-CM | POA: Diagnosis not present

## 2015-03-16 DIAGNOSIS — R350 Frequency of micturition: Secondary | ICD-10-CM | POA: Diagnosis not present

## 2015-03-27 DIAGNOSIS — Z79899 Other long term (current) drug therapy: Secondary | ICD-10-CM | POA: Diagnosis not present

## 2015-03-27 DIAGNOSIS — L299 Pruritus, unspecified: Secondary | ICD-10-CM | POA: Diagnosis not present

## 2015-03-27 DIAGNOSIS — L2389 Allergic contact dermatitis due to other agents: Secondary | ICD-10-CM | POA: Diagnosis not present

## 2015-04-10 DIAGNOSIS — B86 Scabies: Secondary | ICD-10-CM | POA: Diagnosis not present

## 2015-04-15 DIAGNOSIS — B86 Scabies: Secondary | ICD-10-CM | POA: Diagnosis not present

## 2015-04-15 DIAGNOSIS — L299 Pruritus, unspecified: Secondary | ICD-10-CM | POA: Diagnosis not present

## 2015-04-27 DIAGNOSIS — M19041 Primary osteoarthritis, right hand: Secondary | ICD-10-CM | POA: Diagnosis not present

## 2015-04-27 DIAGNOSIS — M79641 Pain in right hand: Secondary | ICD-10-CM | POA: Diagnosis not present

## 2015-04-27 DIAGNOSIS — S63601A Unspecified sprain of right thumb, initial encounter: Secondary | ICD-10-CM | POA: Diagnosis not present

## 2015-04-27 DIAGNOSIS — R402411 Glasgow coma scale score 13-15, in the field [EMT or ambulance]: Secondary | ICD-10-CM | POA: Diagnosis not present

## 2015-04-27 DIAGNOSIS — T148 Other injury of unspecified body region: Secondary | ICD-10-CM | POA: Diagnosis not present

## 2015-04-28 DIAGNOSIS — N3281 Overactive bladder: Secondary | ICD-10-CM | POA: Diagnosis not present

## 2015-04-28 DIAGNOSIS — F0390 Unspecified dementia without behavioral disturbance: Secondary | ICD-10-CM | POA: Diagnosis not present

## 2015-05-04 DIAGNOSIS — M79652 Pain in left thigh: Secondary | ICD-10-CM | POA: Diagnosis not present

## 2015-05-04 DIAGNOSIS — M25552 Pain in left hip: Secondary | ICD-10-CM | POA: Diagnosis not present

## 2015-06-01 DIAGNOSIS — R350 Frequency of micturition: Secondary | ICD-10-CM | POA: Diagnosis not present

## 2015-06-01 DIAGNOSIS — N3281 Overactive bladder: Secondary | ICD-10-CM | POA: Diagnosis not present

## 2015-06-15 DIAGNOSIS — R0602 Shortness of breath: Secondary | ICD-10-CM | POA: Diagnosis not present

## 2015-06-15 DIAGNOSIS — R29898 Other symptoms and signs involving the musculoskeletal system: Secondary | ICD-10-CM | POA: Diagnosis not present

## 2015-06-15 DIAGNOSIS — R339 Retention of urine, unspecified: Secondary | ICD-10-CM | POA: Diagnosis not present

## 2015-06-15 DIAGNOSIS — G309 Alzheimer's disease, unspecified: Secondary | ICD-10-CM | POA: Diagnosis not present

## 2015-06-15 DIAGNOSIS — R600 Localized edema: Secondary | ICD-10-CM | POA: Diagnosis not present

## 2015-06-15 DIAGNOSIS — R262 Difficulty in walking, not elsewhere classified: Secondary | ICD-10-CM | POA: Diagnosis not present

## 2015-06-15 DIAGNOSIS — R531 Weakness: Secondary | ICD-10-CM | POA: Diagnosis not present

## 2015-06-15 DIAGNOSIS — R609 Edema, unspecified: Secondary | ICD-10-CM | POA: Diagnosis not present

## 2015-06-15 DIAGNOSIS — F0280 Dementia in other diseases classified elsewhere without behavioral disturbance: Secondary | ICD-10-CM | POA: Diagnosis not present

## 2015-06-15 DIAGNOSIS — F0151 Vascular dementia with behavioral disturbance: Secondary | ICD-10-CM | POA: Diagnosis not present

## 2015-06-16 DIAGNOSIS — F0390 Unspecified dementia without behavioral disturbance: Secondary | ICD-10-CM | POA: Diagnosis not present

## 2015-06-16 DIAGNOSIS — K219 Gastro-esophageal reflux disease without esophagitis: Secondary | ICD-10-CM | POA: Diagnosis not present

## 2015-06-16 DIAGNOSIS — W19XXXA Unspecified fall, initial encounter: Secondary | ICD-10-CM | POA: Diagnosis not present

## 2015-06-16 DIAGNOSIS — F419 Anxiety disorder, unspecified: Secondary | ICD-10-CM | POA: Diagnosis not present

## 2015-06-16 DIAGNOSIS — I10 Essential (primary) hypertension: Secondary | ICD-10-CM | POA: Diagnosis not present

## 2015-06-16 DIAGNOSIS — E785 Hyperlipidemia, unspecified: Secondary | ICD-10-CM | POA: Diagnosis not present

## 2015-06-16 DIAGNOSIS — F333 Major depressive disorder, recurrent, severe with psychotic symptoms: Secondary | ICD-10-CM | POA: Diagnosis not present

## 2015-06-16 DIAGNOSIS — F0150 Vascular dementia without behavioral disturbance: Secondary | ICD-10-CM | POA: Diagnosis not present

## 2015-06-16 DIAGNOSIS — R262 Difficulty in walking, not elsewhere classified: Secondary | ICD-10-CM | POA: Diagnosis not present

## 2015-06-17 DIAGNOSIS — M545 Low back pain: Secondary | ICD-10-CM | POA: Diagnosis not present

## 2015-06-17 DIAGNOSIS — M25552 Pain in left hip: Secondary | ICD-10-CM | POA: Diagnosis not present

## 2015-06-17 DIAGNOSIS — M25551 Pain in right hip: Secondary | ICD-10-CM | POA: Diagnosis not present

## 2015-06-18 DIAGNOSIS — F0150 Vascular dementia without behavioral disturbance: Secondary | ICD-10-CM | POA: Diagnosis not present

## 2015-06-18 DIAGNOSIS — R262 Difficulty in walking, not elsewhere classified: Secondary | ICD-10-CM | POA: Diagnosis not present

## 2015-06-18 DIAGNOSIS — W19XXXA Unspecified fall, initial encounter: Secondary | ICD-10-CM | POA: Diagnosis not present

## 2015-06-18 DIAGNOSIS — K219 Gastro-esophageal reflux disease without esophagitis: Secondary | ICD-10-CM | POA: Diagnosis not present

## 2015-06-18 DIAGNOSIS — E785 Hyperlipidemia, unspecified: Secondary | ICD-10-CM | POA: Diagnosis not present

## 2015-06-18 DIAGNOSIS — I10 Essential (primary) hypertension: Secondary | ICD-10-CM | POA: Diagnosis not present

## 2015-06-22 ENCOUNTER — Inpatient Hospital Stay
Admission: AD | Admit: 2015-06-22 | Payer: Self-pay | Source: Other Acute Inpatient Hospital | Admitting: Internal Medicine

## 2015-06-22 DIAGNOSIS — I6200 Nontraumatic subdural hemorrhage, unspecified: Secondary | ICD-10-CM | POA: Diagnosis not present

## 2015-06-22 DIAGNOSIS — N3000 Acute cystitis without hematuria: Secondary | ICD-10-CM | POA: Diagnosis not present

## 2015-06-22 DIAGNOSIS — R404 Transient alteration of awareness: Secondary | ICD-10-CM | POA: Diagnosis not present

## 2015-06-22 DIAGNOSIS — R531 Weakness: Secondary | ICD-10-CM | POA: Diagnosis not present

## 2015-06-22 DIAGNOSIS — N390 Urinary tract infection, site not specified: Secondary | ICD-10-CM | POA: Diagnosis not present

## 2015-06-22 DIAGNOSIS — S066X9A Traumatic subarachnoid hemorrhage with loss of consciousness of unspecified duration, initial encounter: Secondary | ICD-10-CM | POA: Diagnosis not present

## 2015-06-23 DIAGNOSIS — S065X0D Traumatic subdural hemorrhage without loss of consciousness, subsequent encounter: Secondary | ICD-10-CM | POA: Diagnosis not present

## 2015-06-23 DIAGNOSIS — E7800 Pure hypercholesterolemia, unspecified: Secondary | ICD-10-CM | POA: Diagnosis present

## 2015-06-23 DIAGNOSIS — F4489 Other dissociative and conversion disorders: Secondary | ICD-10-CM | POA: Diagnosis not present

## 2015-06-23 DIAGNOSIS — I629 Nontraumatic intracranial hemorrhage, unspecified: Secondary | ICD-10-CM | POA: Diagnosis not present

## 2015-06-23 DIAGNOSIS — F0151 Vascular dementia with behavioral disturbance: Secondary | ICD-10-CM | POA: Diagnosis not present

## 2015-06-23 DIAGNOSIS — N3281 Overactive bladder: Secondary | ICD-10-CM | POA: Diagnosis present

## 2015-06-23 DIAGNOSIS — R32 Unspecified urinary incontinence: Secondary | ICD-10-CM | POA: Diagnosis present

## 2015-06-23 DIAGNOSIS — I10 Essential (primary) hypertension: Secondary | ICD-10-CM | POA: Diagnosis present

## 2015-06-23 DIAGNOSIS — S0990XA Unspecified injury of head, initial encounter: Secondary | ICD-10-CM | POA: Diagnosis not present

## 2015-06-23 DIAGNOSIS — E876 Hypokalemia: Secondary | ICD-10-CM | POA: Diagnosis not present

## 2015-06-23 DIAGNOSIS — I82611 Acute embolism and thrombosis of superficial veins of right upper extremity: Secondary | ICD-10-CM | POA: Diagnosis not present

## 2015-06-23 DIAGNOSIS — K219 Gastro-esophageal reflux disease without esophagitis: Secondary | ICD-10-CM | POA: Diagnosis present

## 2015-06-23 DIAGNOSIS — I6202 Nontraumatic subacute subdural hemorrhage: Secondary | ICD-10-CM | POA: Diagnosis not present

## 2015-06-23 DIAGNOSIS — R404 Transient alteration of awareness: Secondary | ICD-10-CM | POA: Diagnosis not present

## 2015-06-23 DIAGNOSIS — I6200 Nontraumatic subdural hemorrhage, unspecified: Secondary | ICD-10-CM | POA: Diagnosis not present

## 2015-06-23 DIAGNOSIS — K589 Irritable bowel syndrome without diarrhea: Secondary | ICD-10-CM | POA: Diagnosis present

## 2015-06-23 DIAGNOSIS — S064X1A Epidural hemorrhage with loss of consciousness of 30 minutes or less, initial encounter: Secondary | ICD-10-CM | POA: Diagnosis not present

## 2015-06-23 DIAGNOSIS — Z681 Body mass index (BMI) 19 or less, adult: Secondary | ICD-10-CM | POA: Diagnosis not present

## 2015-06-23 DIAGNOSIS — F329 Major depressive disorder, single episode, unspecified: Secondary | ICD-10-CM | POA: Diagnosis present

## 2015-06-23 DIAGNOSIS — S065X0A Traumatic subdural hemorrhage without loss of consciousness, initial encounter: Secondary | ICD-10-CM | POA: Diagnosis not present

## 2015-06-23 DIAGNOSIS — N390 Urinary tract infection, site not specified: Secondary | ICD-10-CM | POA: Diagnosis not present

## 2015-06-23 DIAGNOSIS — D649 Anemia, unspecified: Secondary | ICD-10-CM | POA: Diagnosis present

## 2015-06-23 DIAGNOSIS — R54 Age-related physical debility: Secondary | ICD-10-CM | POA: Diagnosis present

## 2015-06-23 DIAGNOSIS — Z7902 Long term (current) use of antithrombotics/antiplatelets: Secondary | ICD-10-CM | POA: Diagnosis not present

## 2015-06-23 DIAGNOSIS — S069X0A Unspecified intracranial injury without loss of consciousness, initial encounter: Secondary | ICD-10-CM | POA: Diagnosis not present

## 2015-06-23 DIAGNOSIS — R4701 Aphasia: Secondary | ICD-10-CM | POA: Diagnosis present

## 2015-06-23 DIAGNOSIS — B965 Pseudomonas (aeruginosa) (mallei) (pseudomallei) as the cause of diseases classified elsewhere: Secondary | ICD-10-CM | POA: Diagnosis present

## 2015-06-23 DIAGNOSIS — Z66 Do not resuscitate: Secondary | ICD-10-CM | POA: Diagnosis not present

## 2015-06-23 DIAGNOSIS — G9389 Other specified disorders of brain: Secondary | ICD-10-CM | POA: Diagnosis not present

## 2015-06-23 DIAGNOSIS — I6789 Other cerebrovascular disease: Secondary | ICD-10-CM | POA: Diagnosis not present

## 2015-06-23 DIAGNOSIS — E870 Hyperosmolality and hypernatremia: Secondary | ICD-10-CM | POA: Diagnosis not present

## 2015-06-23 DIAGNOSIS — G8324 Monoplegia of upper limb affecting left nondominant side: Secondary | ICD-10-CM | POA: Diagnosis present

## 2015-06-23 DIAGNOSIS — E46 Unspecified protein-calorie malnutrition: Secondary | ICD-10-CM | POA: Diagnosis present

## 2015-07-08 DIAGNOSIS — I10 Essential (primary) hypertension: Secondary | ICD-10-CM | POA: Diagnosis not present

## 2015-07-08 DIAGNOSIS — I6200 Nontraumatic subdural hemorrhage, unspecified: Secondary | ICD-10-CM | POA: Diagnosis not present

## 2015-07-08 DIAGNOSIS — S065X0D Traumatic subdural hemorrhage without loss of consciousness, subsequent encounter: Secondary | ICD-10-CM | POA: Diagnosis not present

## 2015-07-08 DIAGNOSIS — R404 Transient alteration of awareness: Secondary | ICD-10-CM | POA: Diagnosis not present

## 2015-07-08 DIAGNOSIS — D649 Anemia, unspecified: Secondary | ICD-10-CM | POA: Diagnosis not present

## 2015-07-08 DIAGNOSIS — N390 Urinary tract infection, site not specified: Secondary | ICD-10-CM | POA: Diagnosis not present

## 2015-07-08 DIAGNOSIS — E876 Hypokalemia: Secondary | ICD-10-CM | POA: Diagnosis not present

## 2015-07-08 DIAGNOSIS — Z9889 Other specified postprocedural states: Secondary | ICD-10-CM | POA: Diagnosis not present

## 2015-07-08 DIAGNOSIS — F0151 Vascular dementia with behavioral disturbance: Secondary | ICD-10-CM | POA: Diagnosis not present

## 2015-07-24 DIAGNOSIS — S065X0D Traumatic subdural hemorrhage without loss of consciousness, subsequent encounter: Secondary | ICD-10-CM | POA: Diagnosis not present

## 2015-07-24 DIAGNOSIS — I6200 Nontraumatic subdural hemorrhage, unspecified: Secondary | ICD-10-CM | POA: Diagnosis not present

## 2015-07-24 DIAGNOSIS — Z9889 Other specified postprocedural states: Secondary | ICD-10-CM | POA: Diagnosis not present

## 2015-09-04 DIAGNOSIS — S32000A Wedge compression fracture of unspecified lumbar vertebra, initial encounter for closed fracture: Secondary | ICD-10-CM | POA: Diagnosis not present

## 2015-09-07 DIAGNOSIS — S065X0D Traumatic subdural hemorrhage without loss of consciousness, subsequent encounter: Secondary | ICD-10-CM | POA: Diagnosis not present

## 2015-09-07 DIAGNOSIS — R2689 Other abnormalities of gait and mobility: Secondary | ICD-10-CM | POA: Diagnosis not present

## 2015-09-07 DIAGNOSIS — F0151 Vascular dementia with behavioral disturbance: Secondary | ICD-10-CM | POA: Diagnosis not present

## 2015-09-08 DIAGNOSIS — R2689 Other abnormalities of gait and mobility: Secondary | ICD-10-CM | POA: Diagnosis not present

## 2015-09-08 DIAGNOSIS — F0151 Vascular dementia with behavioral disturbance: Secondary | ICD-10-CM | POA: Diagnosis not present

## 2015-09-08 DIAGNOSIS — S065X0D Traumatic subdural hemorrhage without loss of consciousness, subsequent encounter: Secondary | ICD-10-CM | POA: Diagnosis not present

## 2015-09-09 DIAGNOSIS — S065X0D Traumatic subdural hemorrhage without loss of consciousness, subsequent encounter: Secondary | ICD-10-CM | POA: Diagnosis not present

## 2015-09-09 DIAGNOSIS — F0151 Vascular dementia with behavioral disturbance: Secondary | ICD-10-CM | POA: Diagnosis not present

## 2015-09-09 DIAGNOSIS — R2689 Other abnormalities of gait and mobility: Secondary | ICD-10-CM | POA: Diagnosis not present

## 2015-09-10 DIAGNOSIS — R2689 Other abnormalities of gait and mobility: Secondary | ICD-10-CM | POA: Diagnosis not present

## 2015-09-10 DIAGNOSIS — F0151 Vascular dementia with behavioral disturbance: Secondary | ICD-10-CM | POA: Diagnosis not present

## 2015-09-10 DIAGNOSIS — S065X0D Traumatic subdural hemorrhage without loss of consciousness, subsequent encounter: Secondary | ICD-10-CM | POA: Diagnosis not present

## 2015-09-11 DIAGNOSIS — F0151 Vascular dementia with behavioral disturbance: Secondary | ICD-10-CM | POA: Diagnosis not present

## 2015-09-11 DIAGNOSIS — S065X0D Traumatic subdural hemorrhage without loss of consciousness, subsequent encounter: Secondary | ICD-10-CM | POA: Diagnosis not present

## 2015-09-11 DIAGNOSIS — R2689 Other abnormalities of gait and mobility: Secondary | ICD-10-CM | POA: Diagnosis not present

## 2015-09-14 DIAGNOSIS — F0151 Vascular dementia with behavioral disturbance: Secondary | ICD-10-CM | POA: Diagnosis not present

## 2015-09-14 DIAGNOSIS — S065X0D Traumatic subdural hemorrhage without loss of consciousness, subsequent encounter: Secondary | ICD-10-CM | POA: Diagnosis not present

## 2015-09-14 DIAGNOSIS — I509 Heart failure, unspecified: Secondary | ICD-10-CM | POA: Diagnosis not present

## 2015-09-14 DIAGNOSIS — R2689 Other abnormalities of gait and mobility: Secondary | ICD-10-CM | POA: Diagnosis not present

## 2015-09-15 DIAGNOSIS — F0151 Vascular dementia with behavioral disturbance: Secondary | ICD-10-CM | POA: Diagnosis not present

## 2015-09-15 DIAGNOSIS — R2689 Other abnormalities of gait and mobility: Secondary | ICD-10-CM | POA: Diagnosis not present

## 2015-09-15 DIAGNOSIS — S065X0D Traumatic subdural hemorrhage without loss of consciousness, subsequent encounter: Secondary | ICD-10-CM | POA: Diagnosis not present

## 2015-09-16 DIAGNOSIS — F0151 Vascular dementia with behavioral disturbance: Secondary | ICD-10-CM | POA: Diagnosis not present

## 2015-09-16 DIAGNOSIS — R2689 Other abnormalities of gait and mobility: Secondary | ICD-10-CM | POA: Diagnosis not present

## 2015-09-16 DIAGNOSIS — S065X0D Traumatic subdural hemorrhage without loss of consciousness, subsequent encounter: Secondary | ICD-10-CM | POA: Diagnosis not present

## 2015-09-16 DIAGNOSIS — I6200 Nontraumatic subdural hemorrhage, unspecified: Secondary | ICD-10-CM | POA: Diagnosis not present

## 2015-09-17 DIAGNOSIS — R2689 Other abnormalities of gait and mobility: Secondary | ICD-10-CM | POA: Diagnosis not present

## 2015-09-17 DIAGNOSIS — S065X0D Traumatic subdural hemorrhage without loss of consciousness, subsequent encounter: Secondary | ICD-10-CM | POA: Diagnosis not present

## 2015-09-17 DIAGNOSIS — F0151 Vascular dementia with behavioral disturbance: Secondary | ICD-10-CM | POA: Diagnosis not present

## 2015-09-18 DIAGNOSIS — S065X0D Traumatic subdural hemorrhage without loss of consciousness, subsequent encounter: Secondary | ICD-10-CM | POA: Diagnosis not present

## 2015-09-18 DIAGNOSIS — F0151 Vascular dementia with behavioral disturbance: Secondary | ICD-10-CM | POA: Diagnosis not present

## 2015-09-18 DIAGNOSIS — R2689 Other abnormalities of gait and mobility: Secondary | ICD-10-CM | POA: Diagnosis not present

## 2015-09-21 DIAGNOSIS — F0151 Vascular dementia with behavioral disturbance: Secondary | ICD-10-CM | POA: Diagnosis not present

## 2015-09-21 DIAGNOSIS — S065X0D Traumatic subdural hemorrhage without loss of consciousness, subsequent encounter: Secondary | ICD-10-CM | POA: Diagnosis not present

## 2015-09-21 DIAGNOSIS — R2689 Other abnormalities of gait and mobility: Secondary | ICD-10-CM | POA: Diagnosis not present

## 2015-09-22 DIAGNOSIS — F0151 Vascular dementia with behavioral disturbance: Secondary | ICD-10-CM | POA: Diagnosis not present

## 2015-09-22 DIAGNOSIS — R2689 Other abnormalities of gait and mobility: Secondary | ICD-10-CM | POA: Diagnosis not present

## 2015-09-22 DIAGNOSIS — S065X0D Traumatic subdural hemorrhage without loss of consciousness, subsequent encounter: Secondary | ICD-10-CM | POA: Diagnosis not present

## 2015-10-07 DIAGNOSIS — N3281 Overactive bladder: Secondary | ICD-10-CM | POA: Diagnosis not present

## 2016-01-27 DIAGNOSIS — E119 Type 2 diabetes mellitus without complications: Secondary | ICD-10-CM | POA: Diagnosis not present

## 2016-01-27 DIAGNOSIS — E559 Vitamin D deficiency, unspecified: Secondary | ICD-10-CM | POA: Diagnosis not present

## 2016-01-27 DIAGNOSIS — D649 Anemia, unspecified: Secondary | ICD-10-CM | POA: Diagnosis not present

## 2016-04-18 DIAGNOSIS — E876 Hypokalemia: Secondary | ICD-10-CM | POA: Diagnosis not present

## 2016-04-18 DIAGNOSIS — D649 Anemia, unspecified: Secondary | ICD-10-CM | POA: Diagnosis not present

## 2016-04-18 DIAGNOSIS — E559 Vitamin D deficiency, unspecified: Secondary | ICD-10-CM | POA: Diagnosis not present

## 2016-04-18 DIAGNOSIS — R509 Fever, unspecified: Secondary | ICD-10-CM | POA: Diagnosis not present

## 2016-08-25 DIAGNOSIS — E119 Type 2 diabetes mellitus without complications: Secondary | ICD-10-CM | POA: Diagnosis not present

## 2016-08-25 DIAGNOSIS — E559 Vitamin D deficiency, unspecified: Secondary | ICD-10-CM | POA: Diagnosis not present

## 2016-08-25 DIAGNOSIS — D649 Anemia, unspecified: Secondary | ICD-10-CM | POA: Diagnosis not present

## 2017-01-09 NOTE — Telephone Encounter (Signed)
Close Encounter 

## 2017-02-03 DIAGNOSIS — F339 Major depressive disorder, recurrent, unspecified: Secondary | ICD-10-CM | POA: Diagnosis not present

## 2017-02-03 DIAGNOSIS — R627 Adult failure to thrive: Secondary | ICD-10-CM | POA: Diagnosis not present

## 2017-02-23 DIAGNOSIS — D649 Anemia, unspecified: Secondary | ICD-10-CM | POA: Diagnosis not present

## 2017-02-23 DIAGNOSIS — I509 Heart failure, unspecified: Secondary | ICD-10-CM | POA: Diagnosis not present

## 2017-02-23 DIAGNOSIS — E119 Type 2 diabetes mellitus without complications: Secondary | ICD-10-CM | POA: Diagnosis not present

## 2017-02-23 DIAGNOSIS — E559 Vitamin D deficiency, unspecified: Secondary | ICD-10-CM | POA: Diagnosis not present

## 2017-04-25 DIAGNOSIS — F339 Major depressive disorder, recurrent, unspecified: Secondary | ICD-10-CM | POA: Diagnosis not present

## 2017-04-25 DIAGNOSIS — F0390 Unspecified dementia without behavioral disturbance: Secondary | ICD-10-CM | POA: Diagnosis not present

## 2017-04-25 DIAGNOSIS — R5381 Other malaise: Secondary | ICD-10-CM | POA: Diagnosis not present

## 2017-04-25 DIAGNOSIS — R1310 Dysphagia, unspecified: Secondary | ICD-10-CM | POA: Diagnosis not present

## 2017-05-09 DIAGNOSIS — R062 Wheezing: Secondary | ICD-10-CM | POA: Diagnosis not present

## 2017-05-09 DIAGNOSIS — D649 Anemia, unspecified: Secondary | ICD-10-CM | POA: Diagnosis not present

## 2017-05-09 DIAGNOSIS — I509 Heart failure, unspecified: Secondary | ICD-10-CM | POA: Diagnosis not present

## 2017-05-12 DIAGNOSIS — E119 Type 2 diabetes mellitus without complications: Secondary | ICD-10-CM | POA: Diagnosis not present

## 2017-05-12 DIAGNOSIS — D649 Anemia, unspecified: Secondary | ICD-10-CM | POA: Diagnosis not present

## 2017-05-16 DIAGNOSIS — L89622 Pressure ulcer of left heel, stage 2: Secondary | ICD-10-CM | POA: Diagnosis not present

## 2017-05-23 DIAGNOSIS — L89622 Pressure ulcer of left heel, stage 2: Secondary | ICD-10-CM | POA: Diagnosis not present

## 2017-05-25 DIAGNOSIS — E441 Mild protein-calorie malnutrition: Secondary | ICD-10-CM | POA: Diagnosis not present

## 2017-05-25 DIAGNOSIS — M2550 Pain in unspecified joint: Secondary | ICD-10-CM | POA: Diagnosis not present

## 2017-05-25 DIAGNOSIS — D649 Anemia, unspecified: Secondary | ICD-10-CM | POA: Diagnosis not present

## 2017-05-25 DIAGNOSIS — R5381 Other malaise: Secondary | ICD-10-CM | POA: Diagnosis not present

## 2017-05-30 DIAGNOSIS — L89622 Pressure ulcer of left heel, stage 2: Secondary | ICD-10-CM | POA: Diagnosis not present

## 2017-06-06 DIAGNOSIS — L89622 Pressure ulcer of left heel, stage 2: Secondary | ICD-10-CM | POA: Diagnosis not present

## 2017-06-13 DIAGNOSIS — L89622 Pressure ulcer of left heel, stage 2: Secondary | ICD-10-CM | POA: Diagnosis not present

## 2017-06-20 DIAGNOSIS — L89622 Pressure ulcer of left heel, stage 2: Secondary | ICD-10-CM | POA: Diagnosis not present

## 2017-06-22 DIAGNOSIS — E441 Mild protein-calorie malnutrition: Secondary | ICD-10-CM | POA: Diagnosis not present

## 2017-06-22 DIAGNOSIS — K219 Gastro-esophageal reflux disease without esophagitis: Secondary | ICD-10-CM | POA: Diagnosis not present

## 2017-06-22 DIAGNOSIS — F0391 Unspecified dementia with behavioral disturbance: Secondary | ICD-10-CM | POA: Diagnosis not present

## 2017-06-22 DIAGNOSIS — F339 Major depressive disorder, recurrent, unspecified: Secondary | ICD-10-CM | POA: Diagnosis not present

## 2017-06-26 DIAGNOSIS — L89622 Pressure ulcer of left heel, stage 2: Secondary | ICD-10-CM | POA: Diagnosis not present

## 2017-07-04 DIAGNOSIS — L89622 Pressure ulcer of left heel, stage 2: Secondary | ICD-10-CM | POA: Diagnosis not present

## 2017-07-24 DIAGNOSIS — R1310 Dysphagia, unspecified: Secondary | ICD-10-CM | POA: Diagnosis not present

## 2017-07-24 DIAGNOSIS — E441 Mild protein-calorie malnutrition: Secondary | ICD-10-CM | POA: Diagnosis not present

## 2017-07-24 DIAGNOSIS — F0391 Unspecified dementia with behavioral disturbance: Secondary | ICD-10-CM | POA: Diagnosis not present

## 2017-07-24 DIAGNOSIS — R5381 Other malaise: Secondary | ICD-10-CM | POA: Diagnosis not present

## 2017-07-26 DIAGNOSIS — F339 Major depressive disorder, recurrent, unspecified: Secondary | ICD-10-CM | POA: Diagnosis not present

## 2017-07-26 DIAGNOSIS — F0151 Vascular dementia with behavioral disturbance: Secondary | ICD-10-CM | POA: Diagnosis not present

## 2017-08-03 DIAGNOSIS — D649 Anemia, unspecified: Secondary | ICD-10-CM | POA: Diagnosis not present

## 2017-08-03 DIAGNOSIS — I509 Heart failure, unspecified: Secondary | ICD-10-CM | POA: Diagnosis not present

## 2017-08-03 DIAGNOSIS — E559 Vitamin D deficiency, unspecified: Secondary | ICD-10-CM | POA: Diagnosis not present

## 2017-08-03 DIAGNOSIS — E119 Type 2 diabetes mellitus without complications: Secondary | ICD-10-CM | POA: Diagnosis not present

## 2017-08-26 DEATH — deceased
